# Patient Record
Sex: Female | Born: 1985
Health system: Southern US, Community
[De-identification: ages and names within clinical notes are randomized; demographics above are authoritative.]

## PROBLEM LIST (undated history)

## (undated) DIAGNOSIS — M419 Scoliosis, unspecified: Secondary | ICD-10-CM

## (undated) DIAGNOSIS — Z8669 Personal history of other diseases of the nervous system and sense organs: Secondary | ICD-10-CM

## (undated) DIAGNOSIS — J45909 Unspecified asthma, uncomplicated: Secondary | ICD-10-CM

## (undated) DIAGNOSIS — E559 Vitamin D deficiency, unspecified: Secondary | ICD-10-CM

## (undated) DIAGNOSIS — T7840XA Allergy, unspecified, initial encounter: Secondary | ICD-10-CM

## (undated) HISTORY — PX: WISDOM TOOTH EXTRACTION: SHX21

## (undated) HISTORY — DX: Scoliosis, unspecified: M41.9

## (undated) HISTORY — DX: Vitamin D deficiency, unspecified: E55.9

## (undated) HISTORY — DX: Personal history of other diseases of the nervous system and sense organs: Z86.69

## (undated) HISTORY — DX: Allergy, unspecified, initial encounter: T78.40XA

## (undated) HISTORY — DX: Unspecified asthma, uncomplicated: J45.909

## (undated) HISTORY — PX: TONSILLECTOMY: SUR1361

---
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
## 2006-08-22 ENCOUNTER — Ambulatory Visit: Payer: Self-pay

## 2010-09-04 ENCOUNTER — Ambulatory Visit: Payer: Self-pay | Admitting: Family Medicine

## 2011-03-30 ENCOUNTER — Ambulatory Visit: Payer: Self-pay | Admitting: General Practice

## 2012-02-19 ENCOUNTER — Ambulatory Visit: Payer: Self-pay | Admitting: Medical

## 2014-05-08 ENCOUNTER — Other Ambulatory Visit: Payer: Self-pay | Admitting: General Practice

## 2014-05-08 DIAGNOSIS — J3501 Chronic tonsillitis: Secondary | ICD-10-CM

## 2014-05-11 ENCOUNTER — Ambulatory Visit
Admission: RE | Admit: 2014-05-11 | Discharge: 2014-05-11 | Disposition: A | Discharge status: Home / Self Care | Payer: BC Managed Care – PPO | Source: Ambulatory Visit | Attending: General Practice | Admitting: General Practice

## 2014-05-11 DIAGNOSIS — J3501 Chronic tonsillitis: Secondary | ICD-10-CM

## 2014-05-11 MED ORDER — IOHEXOL 300 MG/ML  SOLN
75.0000 mL | Freq: Once | INTRAMUSCULAR | Status: AC | PRN
  Administered 2014-05-11: 75 mL via INTRAVENOUS

## 2014-05-28 ENCOUNTER — Ambulatory Visit: Payer: Self-pay | Admitting: Otolaryngology

## 2014-08-27 LAB — HM PAP SMEAR: HM Pap smear: NEGATIVE

## 2014-08-27 LAB — TSH: TSH: 0.88 u[IU]/mL (ref <=5.90)

## 2014-09-06 LAB — LIPID PANEL
Cholesterol: 218 mg/dL — AB (ref 0–200)
HDL: 57 mg/dL (ref 35–70)
LDL Cholesterol: 138 mg/dL
Triglycerides: 113 mg/dL (ref 40–160)

## 2015-03-28 IMAGING — CT CT NECK W/ CM
4 of 5 series · 16 of 33 positions shown, 19 images · IV contrast (omnipaque)
Comparison: Face CT 03/30/2011.

CLINICAL DATA: 28-year-old female with tonsillitis, right greater
than left pain status post multiple rounds of antibiotics. Query
abscess. Initial encounter.

EXAM:
CT NECK WITH CONTRAST
TECHNIQUE: Multidetector CT imaging of the neck was performed using the
standard protocol following the bolus administration of intravenous
contrast.
CONTRAST:  75mL OMNIPAQUE IOHEXOL 300 MG/ML  SOLN

[Series 2: axial neck · axial · 0.39mm/px · z∈[+82,+180]mm · 3 of 99 slices shown]
[im 20/99  bone]
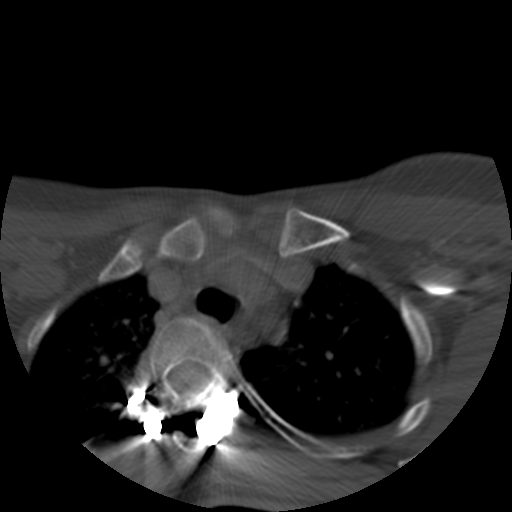
[im 40/99  bone]
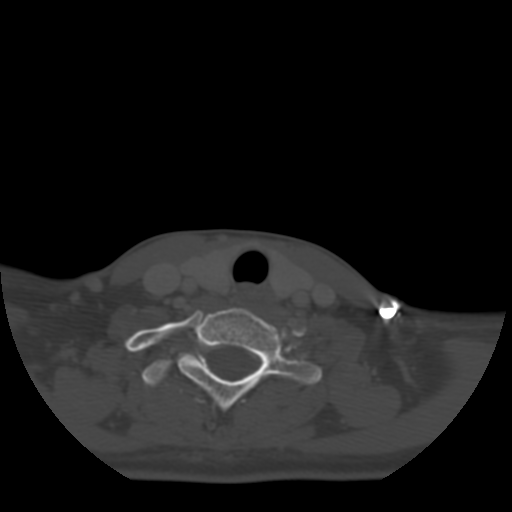
[im 59/99  bone]
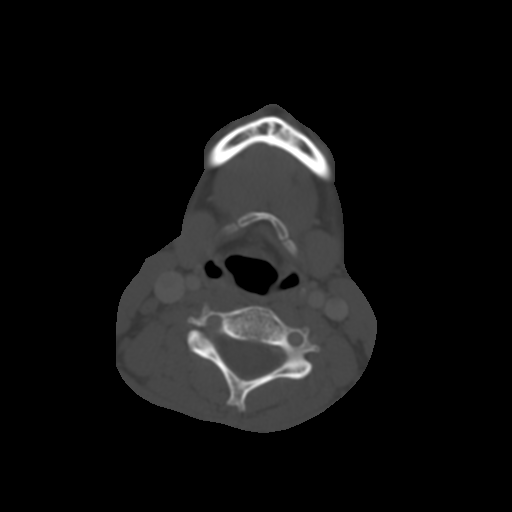

[Series 400: cor · coronal · 0.49mm/px · 3 of 88 slices shown]
[im 18/88  bone]
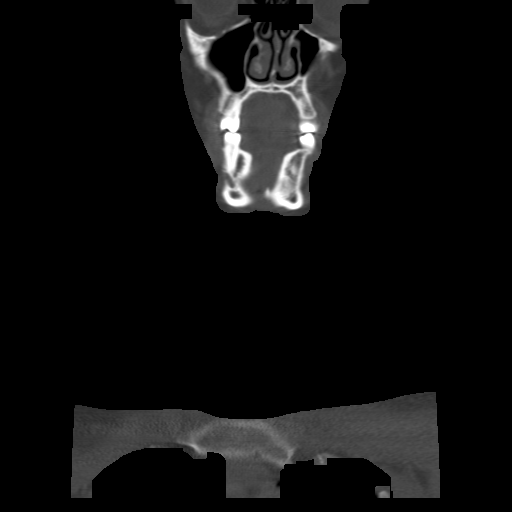
[im 35/88  bone]
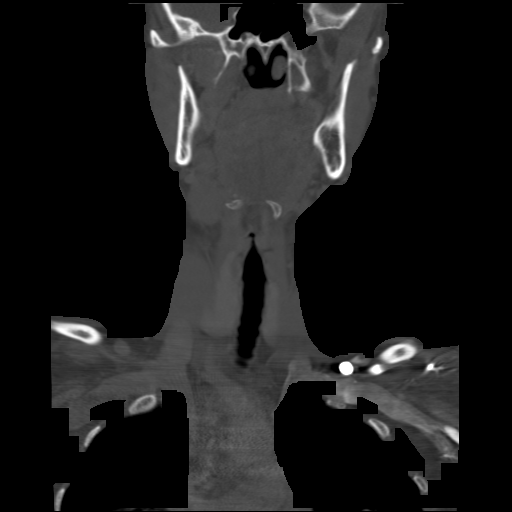
[im 53/88  bone]
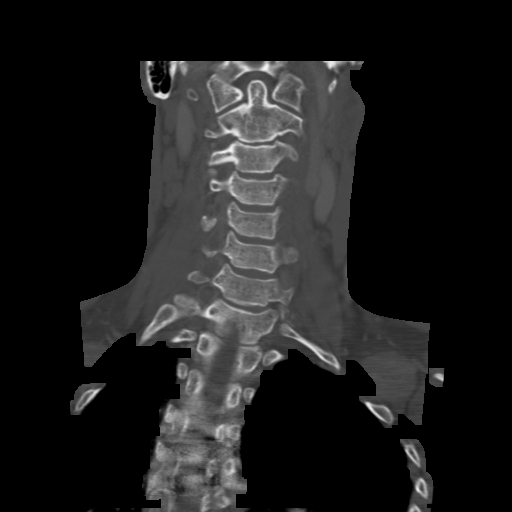

[Series 401: sag · sagittal · 0.49mm/px · 5 of 102 slices shown, 6 images]
[im 34/102  bone]
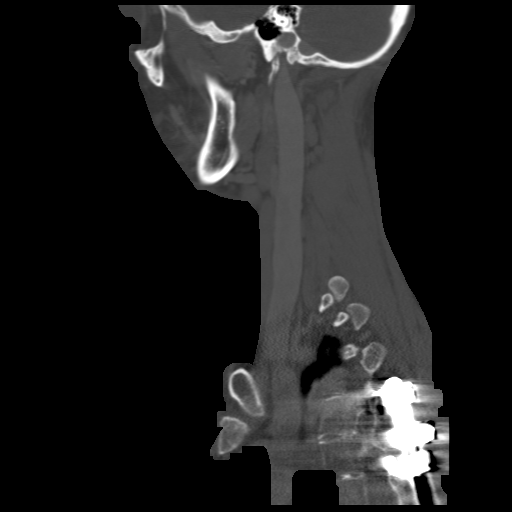
[im 43/102  bone]
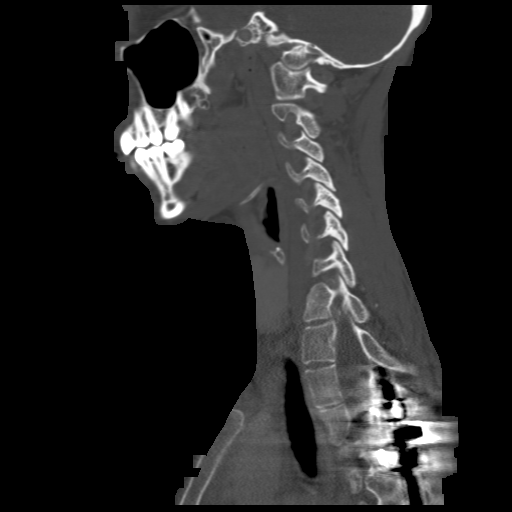
[im 51/102  soft-tissue]
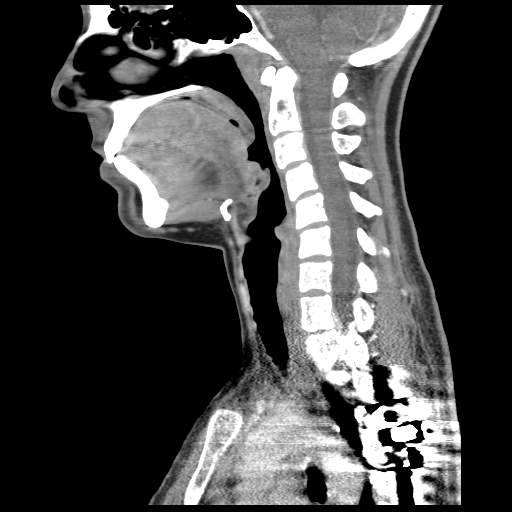
[im 51/102  bone]
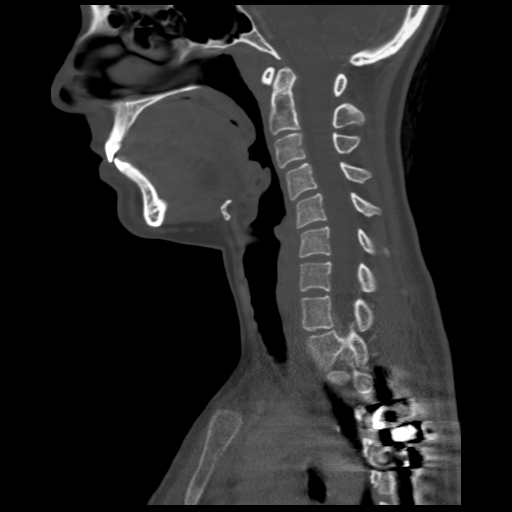
[im 59/102  bone]
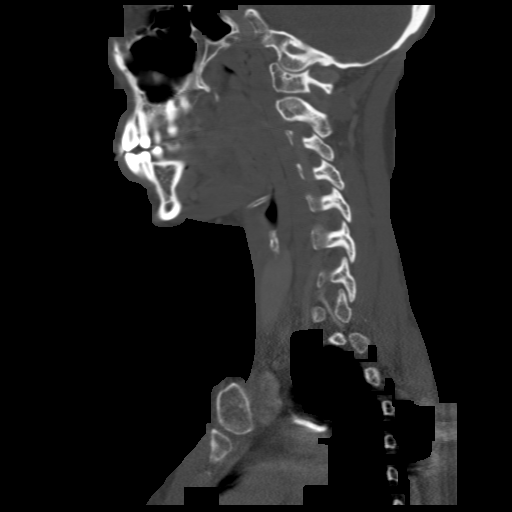
[im 68/102  bone]
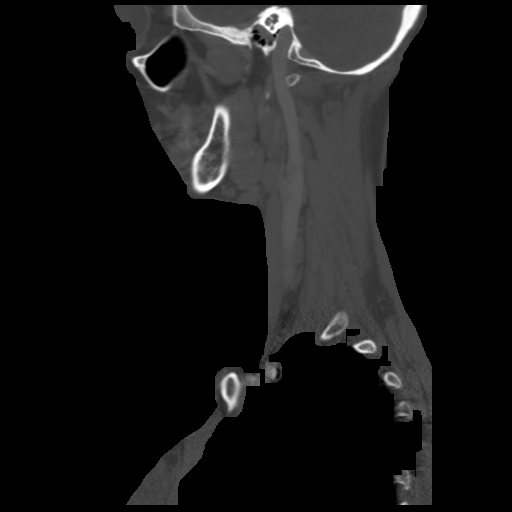

[Series 402: axial · axial · 0.39mm/px · z∈[+54,+221]mm · 5 of 133 slices shown, 7 images]
[im 23/133  soft-tissue]
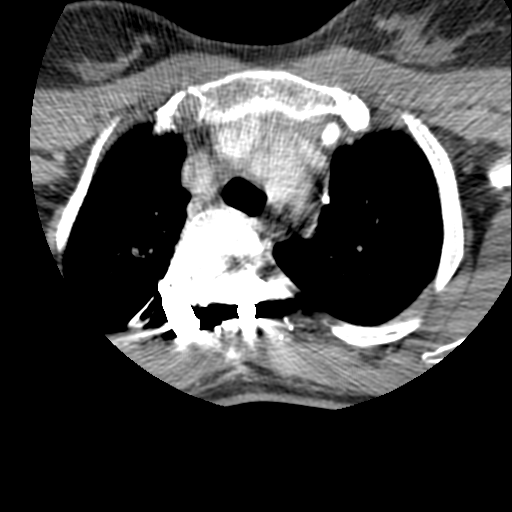
[im 23/133  bone]
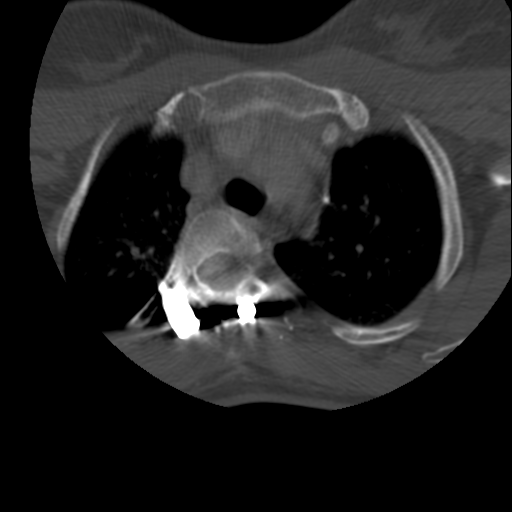
[im 45/133  bone]
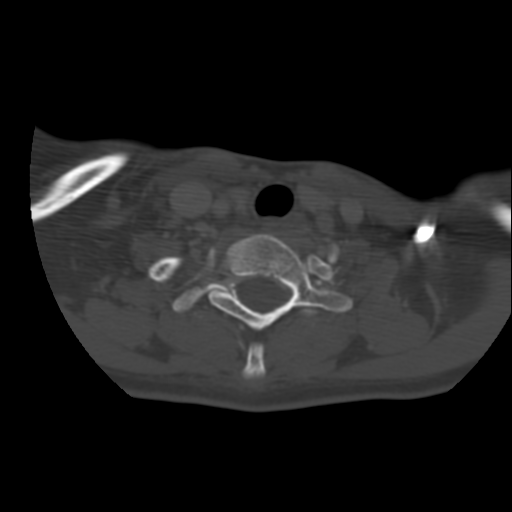
[im 67/133  bone]
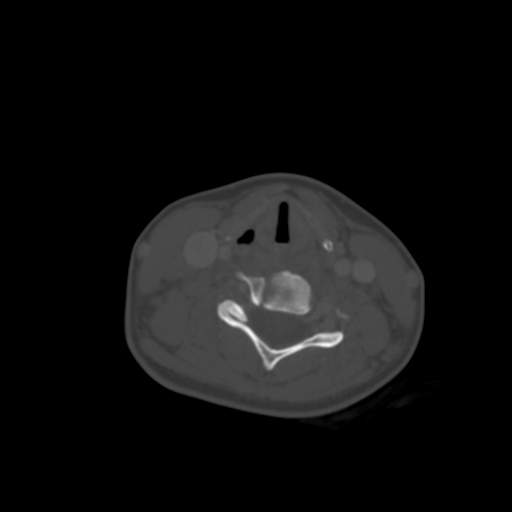
[im 89/133  bone]
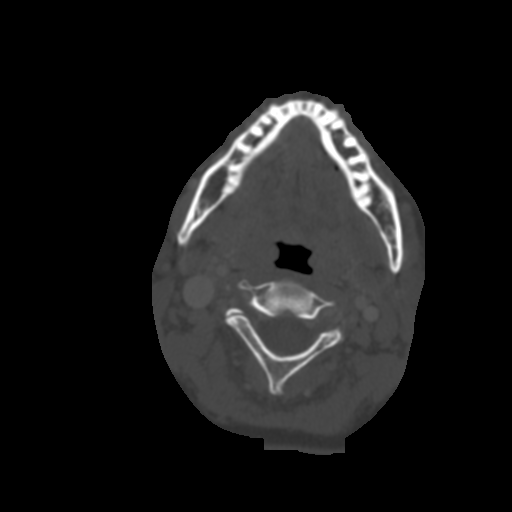
[im 111/133  soft-tissue]
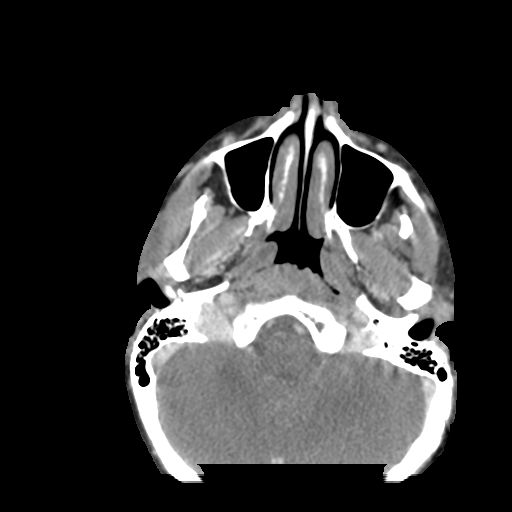
[im 111/133  bone]
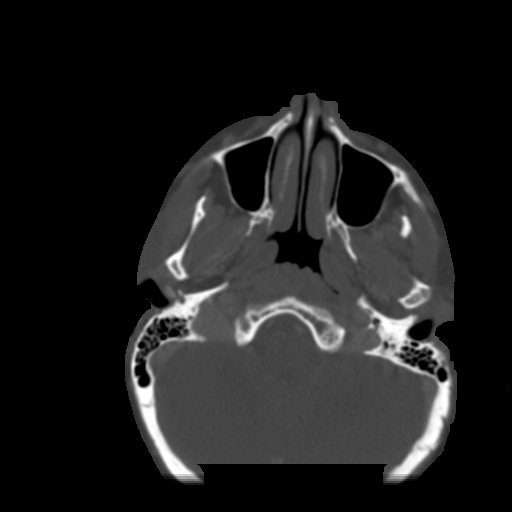

[16 of 33 positions shown; findings below may reference images not displayed]

FINDINGS: Scoliosis with posterior spinal rods partially visible. Negative
lung apices. Negative superior mediastinum (small volume residual
thymus).

Negative thyroid, larynx, submandibular glands, parotid glands,
visible orbits soft tissues, visualized brain parenchyma, and major
vascular structures in the neck and at skullbase.

Mild adenoid and tonsillar pillar enlargement appears fairly
symmetric. The adenoids appear similar to the prior study. Both
tonsillar pillars appear mildly enlarged compared to prior, but no
tonsillar hyper enhancement. No peritonsillar abscess. Negative
parapharyngeal spaces. Negative retropharyngeal space. Negative
sublingual space.

Maximal level II lymph nodes right greater than left (individually
up to 10 mm, series 2, image 31). No hyperenhancing nodes. No cystic
or necrotic nodes. No surrounding fat stranding identified. Right
level 3 nodes are mildly asymmetric, 5 mm short axis on the left).
Up to 8 mm short axis (versus other cervical lymph nodal stations
are within normal limits.

Stable and negative visualized paranasal sinuses and mastoids. No
acute osseous abnormality identified.
IMPRESSION: 1. No neck abscess. Fairly unremarkable for age appearance of the
adenoids and tonsils.
2. Mild reactive appearing right greater than left level 2 and right
level 3 lymph nodes. No nodes enlarged by CT criteria or abnormally
enhancing.

## 2015-08-12 ENCOUNTER — Encounter: Payer: Self-pay | Admitting: *Deleted

## 2015-09-02 ENCOUNTER — Encounter: Payer: Self-pay | Admitting: Obstetrics and Gynecology

## 2015-09-02 ENCOUNTER — Ambulatory Visit (INDEPENDENT_AMBULATORY_CARE_PROVIDER_SITE_OTHER): Payer: BLUE CROSS/BLUE SHIELD | Admitting: Obstetrics and Gynecology

## 2015-09-02 VITALS — BP 98/64 | HR 82 | Ht 63.0 in | Wt 134.3 lb

## 2015-09-02 DIAGNOSIS — Z01419 Encounter for gynecological examination (general) (routine) without abnormal findings: Secondary | ICD-10-CM | POA: Diagnosis not present

## 2015-09-02 NOTE — Progress Notes (Signed)
  Subjective:     Madison [name] is a 29 y.o. female and is here for a comprehensive physical exam. The patient reports no problems.  Social History   Social History  . Marital Status: Single    Spouse Name: N/A  . Number of Children: N/A  . Years of Education: N/A   Occupational History  . Not on file.   Social History Main Topics  . Smoking status: Never Smoker   . Smokeless tobacco: Never Used  . Alcohol Use: No  . Drug Use: No  . Sexual Activity: Yes    Birth Control/ Protection: Pill   Other Topics Concern  . Not on file   Social History Narrative   Health Maintenance  Topic Date Due  . HIV Screening  02/13/2000  . TETANUS/TDAP  02/13/2004  . INFLUENZA VACCINE  05/24/2015  . PAP SMEAR  08/27/2017    The following portions of the patient's history were reviewed and updated as appropriate: allergies, current medications, past family history, past medical history, past social history, past surgical history and problem list.  Review of Systems A comprehensive review of systems was negative.   Objective:    General appearance: alert, cooperative and appears stated age Neck: no adenopathy, no carotid bruit, no JVD, supple, symmetrical, trachea midline and thyroid not enlarged, symmetric, no tenderness/mass/nodules Lungs: clear to auscultation bilaterally Breasts: normal appearance, no masses or tenderness Heart: regular rate and rhythm, S1, S2 normal, no murmur, click, rub or gallop Abdomen: soft, non-tender; bowel sounds normal; no masses,  no organomegaly Pelvic: cervix normal in appearance, external genitalia normal, no adnexal masses or tenderness, no cervical motion tenderness, rectovaginal septum normal, uterus normal size, shape, and consistency and vagina normal without discharge    Assessment:    Healthy female exam. Contraception counseling- desires IUD      Plan:  RTC 1 year for AE and within next 4-6 weeks for Skyla insertion   See After Visit  Summary for Counseling Recommendations

## 2015-09-02 NOTE — Patient Instructions (Signed)
Thank you for enrolling in [facility]. Please follow the instructions below to securely access your online medical record. MyChart allows you to send messages to your doctor, view your test results, renew your prescriptions, schedule appointments, and more.  How Do I Sign Up? 1. In your Internet browser, go to [email or web address]. 2. Click on the New  User? link in the Sign In box.  3. Enter your MyChart Access Code exactly as it appears below. You will not need to use this code after you have completed the sign-up process. If you do not sign up before the expiration date, you must request a new code. MyChart Access Code: [email or web address] Expires: 10/30/2015  9:33 AM  4. Enter the last four digits of your Social Security Number (xxxx) and Date of Birth (mm/dd/yyyy) as indicated and click Next. You will be taken to the next sign-up page. 5. Create a MyChart ID. This will be your MyChart login ID and cannot be changed, so think of one that is secure and easy to remember. 6. Create a MyChart password. You can change your password at any time. 7. Enter your Password Reset Question and Answer and click Next. This can be used at a later time if you forget your password.  8. Select your communication preference, and if applicable enter your e-mail address. You will receive e-mail notification when new information is available in MyChart by choosing to receive e-mail notifications and filling in your e-mail. 9. Click Sign In. You can now view your medical record.   Additional Information If you have questions, you can email [email or web address] or call [phone] to talk to our [facility] staff. Remember, MyChart is NOT to be used for urgent needs. For medical emergencies, dial 911. Levonorgestrel intrauterine device (IUD) What is this medicine? LEVONORGESTREL IUD (LEE voe nor jes trel) is a contraceptive (birth control) device. The device is placed inside the uterus by a  healthcare professional. It is used to prevent pregnancy and can also be used to treat heavy bleeding that occurs during your period. Depending on the device, it can be used for 3 to 5 years. This medicine may be used for other purposes; ask your health care provider or pharmacist if you have questions. What should I tell my health care provider before I take this medicine? They need to know if you have any of these conditions: -abnormal Pap smear -cancer of the breast, uterus, or cervix -diabetes -endometritis -genital or pelvic infection now or in the past -have more than one sexual partner or your partner has more than one partner -heart disease -history of an ectopic or tubal pregnancy -immune system problems -IUD in place -liver disease or tumor -problems with blood clots or take blood-thinners -use intravenous drugs -uterus of unusual shape -vaginal bleeding that has not been explained -an unusual or allergic reaction to levonorgestrel, other hormones, silicone, or polyethylene, medicines, foods, dyes, or preservatives -pregnant or trying to get pregnant -breast-feeding How should I use this medicine? This device is placed inside the uterus by a health care professional. Talk to your pediatrician regarding the use of this medicine in children. Special care may be needed. Overdosage: If you think you have taken too much of this medicine contact a poison control center or emergency room at once. NOTE: This medicine is only for you. Do not share this medicine with others. What if I miss a dose? This does not apply. What may interact with this medicine? Do not take  this medicine with any of the following medications: -amprenavir -bosentan -fosamprenavir This medicine may also interact with the following medications: -aprepitant -barbiturate medicines for inducing sleep or treating seizures -bexarotene -griseofulvin -medicines to treat seizures like carbamazepine, ethotoin,  felbamate, oxcarbazepine, phenytoin, topiramate -modafinil -pioglitazone -rifabutin -rifampin -rifapentine -some medicines to treat HIV infection like atazanavir, indinavir, lopinavir, nelfinavir, tipranavir, ritonavir -St. John's wort -warfarin This list may not describe all possible interactions. Give your health care provider a list of all the medicines, herbs, non-prescription drugs, or dietary supplements you use. Also tell them if you smoke, drink alcohol, or use illegal drugs. Some items may interact with your medicine. What should I watch for while using this medicine? Visit your doctor or health care professional for regular check ups. See your doctor if you or your partner has sexual contact with others, becomes HIV positive, or gets a sexual transmitted disease. This product does not protect you against HIV infection (AIDS) or other sexually transmitted diseases. You can check the placement of the IUD yourself by reaching up to the top of your vagina with clean fingers to feel the threads. Do not pull on the threads. It is a good habit to check placement after each menstrual period. Call your doctor right away if you feel more of the IUD than just the threads or if you cannot feel the threads at all. The IUD may come out by itself. You may become pregnant if the device comes out. If you notice that the IUD has come out use a backup birth control method like condoms and call your health care provider. Using tampons will not change the position of the IUD and are okay to use during your period. What side effects may I notice from receiving this medicine? Side effects that you should report to your doctor or health care professional as soon as possible: -allergic reactions like skin rash, itching or hives, swelling of the face, lips, or tongue -fever, flu-like symptoms -genital sores -high blood pressure -no menstrual period for 6 weeks during use -pain, swelling, warmth in the  leg -pelvic pain or tenderness -severe or sudden headache -signs of pregnancy -stomach cramping -sudden shortness of breath -trouble with balance, talking, or walking -unusual vaginal bleeding, discharge -yellowing of the eyes or skin Side effects that usually do not require medical attention (report to your doctor or health care professional if they continue or are bothersome): -acne -breast pain -change in sex drive or performance -changes in weight -cramping, dizziness, or faintness while the device is being inserted -headache -irregular menstrual bleeding within first 3 to 6 months of use -nausea This list may not describe all possible side effects. Call your doctor for medical advice about side effects. You may report side effects to FDA at 1-800-FDA-1088. Where should I keep my medicine? This does not apply. NOTE: This sheet is a summary. It may not cover all possible information. If you have questions about this medicine, talk to your doctor, pharmacist, or health care provider.    2016, Elsevier/Gold Standard. (2011-11-09 13:54:04)

## 2015-10-01 ENCOUNTER — Encounter: Payer: Self-pay | Admitting: Obstetrics and Gynecology

## 2015-10-01 ENCOUNTER — Ambulatory Visit: Payer: BLUE CROSS/BLUE SHIELD | Admitting: Obstetrics and Gynecology

## 2015-10-01 ENCOUNTER — Ambulatory Visit (INDEPENDENT_AMBULATORY_CARE_PROVIDER_SITE_OTHER): Payer: BLUE CROSS/BLUE SHIELD | Admitting: Obstetrics and Gynecology

## 2015-10-01 VITALS — BP 112/62 | HR 80 | Ht 63.0 in | Wt 132.8 lb

## 2015-10-01 DIAGNOSIS — Z30430 Encounter for insertion of intrauterine contraceptive device: Secondary | ICD-10-CM

## 2015-10-01 MED ORDER — LEVONORGESTREL 13.5 MG IU IUD: 1.0000 [IU] | INTRAUTERINE_SYSTEM | Freq: Once | INTRAUTERINE | Status: DC

## 2015-10-01 NOTE — Progress Notes (Signed)
  Madison [name] is a 29 y.o. year old G72P0000 Caucasian female who presents for placement of a Skyla IUD.  No LMP recorded. Patient is not currently having periods (Reason: Oral contraceptives). Last sexual intercourse was 2 weeks ago, and pregnancy test today was negative  The risks and benefits of the method and placement have been thouroughly reviewed with the patient and all questions were answered.  Specifically the patient is aware of failure rate of 10/998, expulsion of the IUD and of possible perforation.  The patient is aware of irregular bleeding due to the method and understands the incidence of irregular bleeding diminishes with time.  Signed copy of informed consent in chart.   Time out was performed.  A Pederson speculum was placed in the vagina.  The cervix was visualized, prepped using Betadine, and grasped with a single tooth tenaculum. The uterus was found to be neutral and it sounded to 8 cm.  Skyla IUD placed per manufacturer's recommendations.   The strings were trimmed to 3 cm.  The patient was given post procedure instructions, including signs and symptoms of infection and to check for the strings after each menses or each month, and refraining from intercourse or anything in the vagina for 3 days.  She was given a [facility] care card with date Skyla placed, and date Skyla to be removed.   Rockney Ghee, CNM

## 2016-09-05 ENCOUNTER — Encounter: Payer: Self-pay | Admitting: Obstetrics and Gynecology

## 2016-09-05 ENCOUNTER — Ambulatory Visit (INDEPENDENT_AMBULATORY_CARE_PROVIDER_SITE_OTHER): Payer: BLUE CROSS/BLUE SHIELD | Admitting: Obstetrics and Gynecology

## 2016-09-05 ENCOUNTER — Other Ambulatory Visit: Payer: Self-pay | Admitting: Obstetrics and Gynecology

## 2016-09-05 VITALS — BP 101/67 | HR 72 | Ht 63.0 in | Wt 127.9 lb

## 2016-09-05 DIAGNOSIS — Z01419 Encounter for gynecological examination (general) (routine) without abnormal findings: Secondary | ICD-10-CM

## 2016-09-05 NOTE — Patient Instructions (Signed)
 Preventive Care 18-39 Years, Female Preventive care refers to lifestyle choices and visits with your health care provider that can promote health and wellness. What does preventive care include?  A yearly physical exam. This is also called an annual well check.  Dental exams once or twice a year.  Routine eye exams. Ask your health care provider how often you should have your eyes checked.  Personal lifestyle choices, including:  Daily care of your teeth and gums.  Regular physical activity.  Eating a healthy diet.  Avoiding tobacco and drug use.  Limiting alcohol use.  Practicing safe sex.  Taking vitamin and mineral supplements as recommended by your health care provider. What happens during an annual well check? The services and screenings done by your health care provider during your annual well check will depend on your age, overall health, lifestyle risk factors, and family history of disease. Counseling  Your health care provider may ask you questions about your:  Alcohol use.  Tobacco use.  Drug use.  Emotional well-being.  Home and relationship well-being.  Sexual activity.  Eating habits.  Work and work environment.  Method of birth control.  Menstrual cycle.  Pregnancy history. Screening  You may have the following tests or measurements:  Height, weight, and BMI.  Diabetes screening. This is done by checking your blood sugar (glucose) after you have not eaten for a while (fasting).  Blood pressure.  Lipid and cholesterol levels. These may be checked every 5 years starting at age 20.  Skin check.  Hepatitis C blood test.  Hepatitis B blood test.  Sexually transmitted disease (STD) testing.  BRCA-related cancer screening. This may be done if you have a family history of breast, ovarian, tubal, or peritoneal cancers.  Pelvic exam and Pap test. This may be done every 3 years starting at age 21. Starting at age 30, this may be done  every 5 years if you have a Pap test in combination with an HPV test. Discuss your test results, treatment options, and if necessary, the need for more tests with your health care provider. Vaccines  Your health care provider may recommend certain vaccines, such as:  Influenza vaccine. This is recommended every year.  Tetanus, diphtheria, and acellular pertussis (Tdap, Td) vaccine. You may need a Td booster every 10 years.  Varicella vaccine. You may need this if you have not been vaccinated.  HPV vaccine. If you are 26 or younger, you may need three doses over 6 months.  Measles, mumps, and rubella (MMR) vaccine. You may need at least one dose of MMR. You may also need a second dose.  Pneumococcal 13-valent conjugate (PCV13) vaccine. You may need this if you have certain conditions and were not previously vaccinated.  Pneumococcal polysaccharide (PPSV23) vaccine. You may need one or two doses if you smoke cigarettes or if you have certain conditions.  Meningococcal vaccine. One dose is recommended if you are age 19-21 years and a first-year college student living in a residence hall, or if you have one of several medical conditions. You may also need additional booster doses.  Hepatitis A vaccine. You may need this if you have certain conditions or if you travel or work in places where you may be exposed to hepatitis A.  Hepatitis B vaccine. You may need this if you have certain conditions or if you travel or work in places where you may be exposed to hepatitis B.  Haemophilus influenzae type b (Hib) vaccine. You may need   this if you have certain risk factors. Talk to your health care provider about which screenings and vaccines you need and how often you need them. This information is not intended to replace advice given to you by your health care provider. Make sure you discuss any questions you have with your health care provider. Document Released: 12/05/2001 Document Revised:  06/28/2016 Document Reviewed: 08/10/2015 Elsevier Interactive Patient Education  2017 Elsevier Inc.  

## 2016-09-05 NOTE — Progress Notes (Signed)
Subjective:   Madison [name] is a 30 y.o. [place] female here for a routine well-woman exam.  Patient's last menstrual period was 08/14/2016.    Current complaints: prolonged but light bleeding with menses since Skyla placed PCP: me       doesn't desire labs, Tdap & flu vaccine already given this year  Social History: Sexual: heterosexual Marital Status: engaged Living situation: alone Occupation: works at [employer]: no tobacco use Illicit drugs: no history of illicit drug use  The following portions of the patient's history were reviewed and updated as appropriate: allergies, current medications, past family history, past medical history, past social history, past surgical history and problem list.  Past Medical History Past Medical History:  Diagnosis Date  . Vitamin D deficiency     Past Surgical History Past Surgical History:  Procedure Laterality Date  . APPENDECTOMY    . TONSILLECTOMY      Gynecologic History G0P0000  Patient's last menstrual period was 08/14/2016. Contraception: IUD Last Pap: 2015. Results were: normal *  Obstetric History OB History  Gravida Para Term Preterm AB Living  0 0 0 0 0 0  SAB TAB Ectopic Multiple Live Births  0 0 0 0          Current Medications Current Outpatient Prescriptions on File Prior to Visit  Medication Sig Dispense Refill  . Levonorgestrel (SKYLA) 13.5 MG IUD 1 Units by Intrauterine route once. 1 Intra Uterine Device 0  . norethindrone-ethinyl estradiol (JUNEL FE,GILDESS FE,LOESTRIN FE) 1-20 MG-MCG tablet Take 1 tablet by mouth daily.     No current facility-administered medications on file prior to visit.     Review of Systems Patient denies any headaches, blurred vision, shortness of breath, chest pain, abdominal pain, problems with bowel movements, urination, or intercourse.  Objective:  BP 101/67   Pulse 72   Ht 5' 3"  (1.6 m)   Wt 127 lb 14.4 oz (58 kg)   LMP 08/14/2016   BMI  22.66 kg/m  Physical Exam  General:  Well developed, well nourished, no acute distress. She is alert and oriented x3. Skin:  Warm and dry Neck:  Midline trachea, no thyromegaly or nodules Cardiovascular: Regular rate and rhythm, no murmur heard Lungs:  Effort normal, all lung fields clear to auscultation bilaterally Breasts:  No dominant palpable mass, retraction, or nipple discharge Abdomen:  Soft, non tender, no hepatosplenomegaly or masses Pelvic:  External genitalia is normal in appearance.  The vagina is normal in appearance. The cervix is bulbous, no CMT.  Thin prep pap is done with HR HPV cotesting. Uterus is felt to be normal size, shape, and contour.  No adnexal masses or tenderness noted.IUD string noted Extremities:  No swelling or varicosities noted Psych:  She has a normal mood and affect  Assessment:   Healthy well-woman exam IUD check  Plan:  No labs indicated Discussed using OCPs to regulate menses F/U 1 year for AE, or sooner if needed    Rockney Ghee, CNM

## 2016-09-06 LAB — CYTOLOGY - PAP

## 2017-02-15 ENCOUNTER — Ambulatory Visit: Payer: Self-pay | Admitting: Medical

## 2017-02-15 ENCOUNTER — Encounter: Payer: Self-pay | Admitting: Medical

## 2017-02-15 VITALS — BP 102/64 | HR 74 | Temp 98.3°F | Resp 16 | Ht 63.0 in | Wt 127.0 lb

## 2017-02-15 DIAGNOSIS — J302 Other seasonal allergic rhinitis: Secondary | ICD-10-CM | POA: Insufficient documentation

## 2017-02-15 DIAGNOSIS — J301 Allergic rhinitis due to pollen: Secondary | ICD-10-CM

## 2017-02-15 DIAGNOSIS — R0982 Postnasal drip: Secondary | ICD-10-CM

## 2017-02-15 DIAGNOSIS — H6983 Other specified disorders of Eustachian tube, bilateral: Secondary | ICD-10-CM

## 2017-02-15 NOTE — Progress Notes (Signed)
   Subjective:    Patient ID: Madison [name], female    DOB: [birth year], 31 y.o.   MRN: [id number]  HPI 31 yo female with ear pain bilaterally L>R since Monday,  Facial pressure behind eyes. No fever or chills. No colored discharge from nose or cough.Came to be checked seeing family member who is on Chemo and wants to make sure she is well.    Review of Systems  Constitutional: Positive for chills. Negative for fever.  HENT: Positive for ear pain, postnasal drip, rhinorrhea, sinus pressure, sneezing, sore throat and trouble swallowing. Negative for congestion, ear discharge, sinus pain and tinnitus.   Eyes: Negative for discharge and itching.  Respiratory: Positive for cough and shortness of breath. Negative for wheezing.   Cardiovascular: Negative for chest pain.  Gastrointestinal: Negative for diarrhea, nausea and vomiting.  Endocrine: Negative for cold intolerance and heat intolerance.  Genitourinary: Negative for dysuria.  Musculoskeletal: Positive for neck pain. Negative for back pain.  Allergic/Immunologic: Positive for environmental allergies. Negative for food allergies.  Neurological: Negative for dizziness, syncope and light-headedness.  Hematological: Positive for adenopathy.  Psychiatric/Behavioral: Negative for confusion and hallucinations.  neck pain from sitting in meetings looking at the lap top. Usually stretches neck out to relieve pain.     Objective:   Physical Exam  Constitutional: She is oriented to person, place, and time. She appears well-developed and well-nourished.  HENT:  Head: Normocephalic and atraumatic.  Right Ear: External ear normal.  Left Ear: External ear normal.  Eyes: Conjunctivae and EOM are normal. Pupils are equal, round, and reactive to light.  Neck: Normal range of motion. Neck supple.  Cardiovascular: Normal rate, regular rhythm and normal heart sounds.  Exam reveals no gallop and no friction rub.   No murmur heard. Pulmonary/Chest:  Effort normal and breath sounds normal.  Musculoskeletal: Normal range of motion.  Neurological: She is alert and oriented to person, place, and time.  Skin: Skin is warm and dry.  Psychiatric: She has a normal mood and affect. Her behavior is normal.  Nursing note and vitals reviewed.  Shiners under eyes.       Assessment & Plan:  Eustachian Tube Dysfunction recommended otc decongestant (pseudoephedrine she likes the best). Post nasal drip stay on Allegra take as directed ( does not like steroid nasal sprays). Use saline nasal spray  3-4 x /day or nettie pot . Return to the clinic if symptoms doesn't improve or if they worsen. Verbalized understanding. No questions at discharge.

## 2017-02-15 NOTE — Progress Notes (Signed)
Patient complains of bilateral ear pain for 1 week Left worse than right.  Also states she has a sore throat, feels like a lump in the back of her throat, also states that neck is tender to touch.  OTC - allegra.

## 2017-03-01 ENCOUNTER — Ambulatory Visit: Payer: Self-pay | Admitting: Medical

## 2017-04-10 ENCOUNTER — Encounter: Payer: Self-pay | Admitting: Obstetrics and Gynecology

## 2017-04-16 ENCOUNTER — Encounter: Payer: Self-pay | Admitting: Medical

## 2017-04-16 ENCOUNTER — Ambulatory Visit: Payer: Self-pay | Admitting: Medical

## 2017-04-16 VITALS — BP 90/64 | HR 77 | Temp 98.8°F | Resp 16

## 2017-04-16 DIAGNOSIS — R059 Cough, unspecified: Secondary | ICD-10-CM

## 2017-04-16 DIAGNOSIS — R05 Cough: Secondary | ICD-10-CM

## 2017-04-16 DIAGNOSIS — J0100 Acute maxillary sinusitis, unspecified: Secondary | ICD-10-CM

## 2017-04-16 DIAGNOSIS — J029 Acute pharyngitis, unspecified: Secondary | ICD-10-CM

## 2017-04-16 LAB — POCT RAPID STREP A (OFFICE): RAPID STREP A SCREEN: NEGATIVE

## 2017-04-16 MED ORDER — AMOXICILLIN-POT CLAVULANATE 875-125 MG PO TABS: 1.0000 | ORAL_TABLET | Freq: Two times a day (BID) | ORAL | 0 refills | Status: DC

## 2017-04-16 NOTE — Progress Notes (Addendum)
04/20/17 8:50  Patient with cough would like some cough medication.  Will e-prescribed benzonatate perles  32m on by mouth three times a day as needed for cough  #20 no refills.   Strep test negative  Subjective:    Patient ID: Madison [name] female    DOB: 405/24/1987 31y.o.   MRN: [id number] HPI Started yesterday with symptoms of facial pain in the maxillary region, sore throat , couging up yellow and brown phelgm and green discharge from the nares.   Review of Systems  Constitutional: Negative for fever.  HENT: Positive for congestion, postnasal drip, rhinorrhea, sinus pain, sinus pressure and sore throat. Negative for ear pain.   Eyes: Negative.   Respiratory: Positive for cough and chest tightness. Negative for shortness of breath and wheezing.   Cardiovascular: Negative for chest pain.  Gastrointestinal: Negative for abdominal pain.  Genitourinary: Negative for dysuria.  Musculoskeletal: Negative for back pain.  Allergic/Immunologic: Positive for environmental allergies.       Objective:   Physical Exam  Constitutional: She is oriented to person, place, and time. Vital signs are normal. She appears well-developed and well-nourished.  HENT:  Head: Normocephalic and atraumatic.  Right Ear: External ear normal. A middle ear effusion is present.  Left Ear: External ear normal. A middle ear effusion is present.  Nose: Mucosal edema and rhinorrhea present. Right sinus exhibits maxillary sinus tenderness. Left sinus exhibits maxillary sinus tenderness.  Mouth/Throat: Uvula is midline and mucous membranes are normal. Posterior oropharyngeal erythema present. No oropharyngeal exudate, posterior oropharyngeal edema or tonsillar abscesses.  Eyes: Conjunctivae and EOM are normal. Pupils are equal, round, and reactive to light.  Neck: Normal range of motion. Neck supple.  Cardiovascular: Normal rate and regular rhythm.  Exam reveals no gallop and no friction rub.   No murmur  heard. Pulmonary/Chest: Effort normal and breath sounds normal.  Musculoskeletal: Normal range of motion.  Lymphadenopathy:    She has cervical adenopathy.  Neurological: She is alert and oriented to person, place, and time.  Skin: Skin is warm and dry.  Psychiatric: She has a normal mood and affect. Her behavior is normal.  Nursing note and vitals reviewed.   Mild erythema posterior pharynx and  Post nasal drip noted.      Assessment & Plan:  Sinusitis, pharyngitis E-prescribed Augmentin 875 mg -3232mone tablet by mouth twice daily x  10 days  #20 no refills.  Return to the clinic  3-5 days if not improving. Going on retreat for work  returns Wednesday.

## 2017-04-16 NOTE — Patient Instructions (Signed)

## 2017-04-20 MED ORDER — BENZONATATE 100 MG PO CAPS: 100.0000 mg | ORAL_CAPSULE | Freq: Three times a day (TID) | ORAL | 0 refills | Status: DC

## 2017-04-20 NOTE — Addendum Note (Signed)
Addended by: , Nira Conn R on: 04/20/2017 08:55 AM   Modules accepted: Orders

## 2017-05-24 ENCOUNTER — Encounter: Payer: Self-pay | Admitting: Obstetrics and Gynecology

## 2017-06-29 ENCOUNTER — Other Ambulatory Visit: Payer: Self-pay

## 2017-06-29 DIAGNOSIS — Z0000 Encounter for general adult medical examination without abnormal findings: Secondary | ICD-10-CM

## 2017-06-29 LAB — POCT URINALYSIS DIPSTICK
BILIRUBIN UA: NEGATIVE
Glucose, UA: NEGATIVE
Ketones, UA: NEGATIVE
LEUKOCYTES UA: NEGATIVE
NITRITE UA: NEGATIVE
PH UA: 6 (ref 5.0–8.0)
PROTEIN UA: NEGATIVE
Spec Grav, UA: 1.01 (ref 1.010–1.025)
UROBILINOGEN UA: 0.2 U/dL

## 2017-06-30 LAB — CMP12+LP+TP+TSH+6AC+CBC/D/PLT
ALBUMIN: 4.7 g/dL (ref 3.5–5.5)
ALT: 15 IU/L (ref 0–32)
AST: 20 IU/L (ref 0–40)
Albumin/Globulin Ratio: 2 (ref 1.2–2.2)
Alkaline Phosphatase: 45 IU/L (ref 39–117)
BUN/Creatinine Ratio: 16 (ref 9–23)
BUN: 11 mg/dL (ref 6–20)
Basophils Absolute: 0 10*3/uL (ref 0.0–0.2)
Basos: 0 %
Bilirubin Total: 0.4 mg/dL (ref 0.0–1.2)
CALCIUM: 9.6 mg/dL (ref 8.7–10.2)
CHOLESTEROL TOTAL: 157 mg/dL (ref 100–199)
Chloride: 104 mmol/L (ref 96–106)
Chol/HDL Ratio: 2.7 ratio (ref 0.0–4.4)
Creatinine, Ser: 0.67 mg/dL (ref 0.57–1.00)
EOS (ABSOLUTE): 0.2 10*3/uL (ref 0.0–0.4)
Eos: 2 %
Estimated CHD Risk: <0.5 times avg. (ref 0.0–1.0)
FREE THYROXINE INDEX: 2.5 (ref 1.2–4.9)
GFR calc Af Amer: 135 mL/min/{1.73_m2} (ref >59)
GFR calc non Af Amer: 117 mL/min/{1.73_m2} (ref >59)
GGT: 9 IU/L (ref 0–60)
Globulin, Total: 2.4 g/dL (ref 1.5–4.5)
Glucose: 80 mg/dL (ref 65–99)
HDL: 58 mg/dL (ref >39)
Hematocrit: 40.3 % (ref 34.0–46.6)
Hemoglobin: 12.7 g/dL (ref 11.1–15.9)
IMMATURE GRANS (ABS): 0 10*3/uL (ref 0.0–0.1)
IRON: 135 ug/dL (ref 27–159)
Immature Granulocytes: 0 %
LDH: 144 IU/L (ref 119–226)
LDL Calculated: 86 mg/dL (ref 0–99)
LYMPHS: 22 %
Lymphocytes Absolute: 2 10*3/uL (ref 0.7–3.1)
MCH: 30.2 pg (ref 26.6–33.0)
MCHC: 31.5 g/dL (ref 31.5–35.7)
MCV: 96 fL (ref 79–97)
MONOS ABS: 0.5 10*3/uL (ref 0.1–0.9)
Monocytes: 5 %
NEUTROS PCT: 71 %
Neutrophils Absolute: 6.4 10*3/uL (ref 1.4–7.0)
PHOSPHORUS: 3.3 mg/dL (ref 2.5–4.5)
POTASSIUM: 4.9 mmol/L (ref 3.5–5.2)
Platelets: 275 10*3/uL (ref 150–379)
RBC: 4.21 x10E6/uL (ref 3.77–5.28)
RDW: 13.2 % (ref 12.3–15.4)
Sodium: 142 mmol/L (ref 134–144)
T3 Uptake Ratio: 27 % (ref 24–39)
T4 TOTAL: 9.2 ug/dL (ref 4.5–12.0)
TRIGLYCERIDES: 64 mg/dL (ref 0–149)
TSH: 1.07 u[IU]/mL (ref 0.450–4.500)
Total Protein: 7.1 g/dL (ref 6.0–8.5)
Uric Acid: 4.1 mg/dL (ref 2.5–7.1)
VLDL Cholesterol Cal: 13 mg/dL (ref 5–40)
WBC: 9.1 10*3/uL (ref 3.4–10.8)

## 2017-06-30 LAB — VITAMIN D 25 HYDROXY (VIT D DEFICIENCY, FRACTURES): Vit D, 25-Hydroxy: 26.5 ng/mL — ABNORMAL LOW (ref 30.0–100.0)

## 2017-07-02 ENCOUNTER — Ambulatory Visit: Payer: Self-pay | Admitting: Medical

## 2017-07-04 ENCOUNTER — Encounter: Payer: Self-pay | Admitting: Medical

## 2017-07-04 ENCOUNTER — Ambulatory Visit: Payer: Self-pay | Admitting: Medical

## 2017-07-04 VITALS — BP 118/74 | HR 54 | Temp 97.5°F | Resp 18 | Ht 63.0 in | Wt 127.0 lb

## 2017-07-04 DIAGNOSIS — E559 Vitamin D deficiency, unspecified: Secondary | ICD-10-CM

## 2017-07-04 DIAGNOSIS — Z889 Allergy status to unspecified drugs, medicaments and biological substances status: Secondary | ICD-10-CM

## 2017-07-04 DIAGNOSIS — Z7184 Encounter for health counseling related to travel: Secondary | ICD-10-CM

## 2017-07-04 MED ORDER — AZITHROMYCIN 250 MG PO TABS: ORAL_TABLET | ORAL | 0 refills | Status: DC

## 2017-07-04 MED ORDER — MONTELUKAST SODIUM 10 MG PO TABS: 10.0000 mg | ORAL_TABLET | Freq: Every day | ORAL | 2 refills | Status: DC

## 2017-07-04 NOTE — Progress Notes (Signed)
Subjective:    Patient ID: Madison [name], female    DOB: 04/26/1986, 31 y.o.   MRN: [id number]  HPI  31 yo female here for Travel consultation to   [place] and [place]  4 days each.  Would like a flu vaccine,  And a refill on Singulair. Would also like to take a Z-pak with her on travels in case she gets sick.  Review of Systems  Constitutional: Negative for chills and fever.  HENT: Positive for congestion, ear pain and sinus pressure. Negative for sore throat and tinnitus.   Eyes: Negative for discharge and itching.  Respiratory: Negative for cough and shortness of breath.   Cardiovascular: Negative for chest pain, palpitations and leg swelling.  Gastrointestinal: Negative for abdominal pain.  Endocrine: Negative for polydipsia, polyphagia and polyuria.  Genitourinary: Negative for dysuria.  Musculoskeletal: Negative for back pain and myalgias.  Skin: Positive for rash.  Allergic/Immunologic: Positive for environmental allergies. Negative for food allergies and immunocompromised state.  Neurological: Positive for headaches. Negative for dizziness, syncope and light-headedness.  Hematological: Negative for adenopathy.  Psychiatric/Behavioral: Positive for sleep disturbance. Negative for behavioral problems, confusion, hallucinations, self-injury and suicidal ideas.    Headaches base of the neck wraps around to the front.    Hives on left forearm , thinks it is due to stress. Getting married in 17 days under a lot of stress.. Dreaming about the [facility]. Objective:   Physical Exam  Constitutional: She is oriented to person, place, and time. She appears well-developed and well-nourished.  HENT:  Head: Normocephalic and atraumatic.  Right Ear: External ear normal.  Left Ear: External ear normal.  Mouth/Throat: Oropharynx is clear and moist.  Eyes: Pupils are equal, round, and reactive to light. Conjunctivae and EOM are normal.  Neck: Normal range of motion. Neck supple.   Cardiovascular: Normal rate, regular rhythm and normal heart sounds.   Pulmonary/Chest: Effort normal and breath sounds normal.  Musculoskeletal: Normal range of motion.  Lymphadenopathy:    She has no cervical adenopathy.  Neurological: She is alert and oriented to person, place, and time.  Skin: Rash noted.  Psychiatric: She has a normal mood and affect. Her behavior is normal. Judgment and thought content normal.  Nursing note and vitals reviewed.     Maculopapular fine rash  with erythema on distal forearm left side medially     Assessment & Plan:  Eustachian tube dysfunction bilaterally Travel consult already has had Hep A completed 2017 and Hep B series ( completed in middle school).Tdap in 2017. Flu vaccine given. Contact dermatitis or possibly stress induced rash on the left forearm given hydrocortisone cream!% applied to the area by patient lot [id number] Get documentation of  Hep B vaccine from student health.  Z-Pak in case patient gets sick during traveling. Vitamin D deficiency  5000 IU/ day recheck in  3 months. Reviewed labs with patient from 06/29/2017 needs to set up annual exam appointment. Meds ordered this encounter  Medications  . montelukast (SINGULAIR) 10 MG tablet    Sig: Take 1 tablet (10 mg total) by mouth at bedtime.    Dispense:  30 tablet    Refill:  2  . azithromycin (ZITHROMAX) 250 MG tablet    Sig: Take two tablets by mouth day 1 then one tablet days 2-5 , take  With food.    Dispense:  6 tablet    Refill:  0  return to the clinic as needed. Patient verbalizes understanding and has no questions  at this time.

## 2017-07-04 NOTE — Patient Instructions (Addendum)
Contact the office if you have any concerns.    Influenza Virus Vaccine injection (Fluarix) What is this medicine? INFLUENZA VIRUS VACCINE (in floo EN zuh VAHY ruhs vak SEEN) helps to reduce the risk of getting influenza also known as the flu. This medicine may be used for other purposes; ask your health care provider or pharmacist if you have questions. COMMON BRAND NAME(S): Fluarix, Fluzone What should I tell my health care provider before I take this medicine? They need to know if you have any of these conditions: -bleeding disorder like hemophilia -fever or infection -Guillain-Barre syndrome or other neurological problems -immune system problems -infection with the human immunodeficiency virus (HIV) or AIDS -low blood platelet counts -multiple sclerosis -an unusual or allergic reaction to influenza virus vaccine, eggs, chicken proteins, latex, gentamicin, other medicines, foods, dyes or preservatives -pregnant or trying to get pregnant -breast-feeding How should I use this medicine? This vaccine is for injection into a muscle. It is given by a health care professional. A copy of Vaccine Information Statements will be given before each vaccination. Read this sheet carefully each time. The sheet may change frequently. Talk to your pediatrician regarding the use of this medicine in children. Special care may be needed. Overdosage: If you think you have taken too much of this medicine contact a poison control center or emergency room at once. NOTE: This medicine is only for you. Do not share this medicine with others. What if I miss a dose? This does not apply. What may interact with this medicine? -chemotherapy or radiation therapy -medicines that lower your immune system like etanercept, anakinra, infliximab, and adalimumab -medicines that treat or prevent blood clots like warfarin -phenytoin -steroid medicines like prednisone or cortisone -theophylline -vaccines This list may  not describe all possible interactions. Give your health care provider a list of all the medicines, herbs, non-prescription drugs, or dietary supplements you use. Also tell them if you smoke, drink alcohol, or use illegal drugs. Some items may interact with your medicine. What should I watch for while using this medicine? Report any side effects that do not go away within 3 days to your doctor or health care professional. Call your health care provider if any unusual symptoms occur within 6 weeks of receiving this vaccine. You may still catch the flu, but the illness is not usually as bad. You cannot get the flu from the vaccine. The vaccine will not protect against colds or other illnesses that may cause fever. The vaccine is needed every year. What side effects may I notice from receiving this medicine? Side effects that you should report to your doctor or health care professional as soon as possible: -allergic reactions like skin rash, itching or hives, swelling of the face, lips, or tongue Side effects that usually do not require medical attention (report to your doctor or health care professional if they continue or are bothersome): -fever -headache -muscle aches and pains -pain, tenderness, redness, or swelling at site where injected -weak or tired This list may not describe all possible side effects. Call your doctor for medical advice about side effects. You may report side effects to FDA at 1-800-FDA-1088. Where should I keep my medicine? This vaccine is only given in a clinic, pharmacy, doctor's office, or other health care setting and will not be stored at home. NOTE: This sheet is a summary. It may not cover all possible information. If you have questions about this medicine, talk to your doctor, pharmacist, or health care provider.  2018 Elsevier/Gold Standard (2008-05-06 09:30:40)  Eustachian Tube Dysfunction The eustachian tube connects the middle ear to the back of the nose. It  regulates air pressure in the middle ear by allowing air to move between the ear and nose. It also helps to drain fluid from the middle ear space. When the eustachian tube does not function properly, air pressure, fluid, or both can build up in the middle ear. Eustachian tube dysfunction can affect one or both ears. What are the causes? This condition happens when the eustachian tube becomes blocked or cannot open normally. This may result from:  Ear infections.  Colds and other upper respiratory infections.  Allergies.  Irritation, such as from cigarette smoke or acid from the stomach coming up into the esophagus (gastroesophageal reflux).  Sudden changes in air pressure, such as from descending in an airplane.  Abnormal growths in the nose or throat, such as nasal polyps, tumors, or enlarged tissue at the back of the throat (adenoids).  What increases the risk? This condition may be more likely to develop in people who smoke and people who are overweight. Eustachian tube dysfunction may also be more likely to develop in children, especially children who have:  Certain birth defects of the mouth, such as cleft palate.  Large tonsils and adenoids.  What are the signs or symptoms? Symptoms of this condition may include:  A feeling of fullness in the ear.  Ear pain.  Clicking or popping noises in the ear.  Ringing in the ear.  Hearing loss.  Loss of balance.  Symptoms may get worse when the air pressure around you changes, such as when you travel to an area of high elevation or fly on an airplane. How is this diagnosed? This condition may be diagnosed based on:  Your symptoms.  A physical exam of your ear, nose, and throat.  Tests, such as those that measure: ? The movement of your eardrum (tympanogram). ? Your hearing (audiometry).  How is this treated? Treatment depends on the cause and severity of your condition. If your symptoms are mild, you may be able to  relieve your symptoms by moving air into ("popping") your ears. If you have symptoms of fluid in your ears, treatment may include:  Decongestants.  Antihistamines.  Nasal sprays or ear drops that contain medicines that reduce swelling (steroids).  In some cases, you may need to have a procedure to drain the fluid in your eardrum (myringotomy). In this procedure, a small tube is placed in the eardrum to:  Drain the fluid.  Restore the air in the middle ear space.  Follow these instructions at home:  Take over-the-counter and prescription medicines only as told by your health care provider.  Use techniques to help pop your ears as recommended by your health care provider. These may include: ? Chewing gum. ? Yawning. ? Frequent, forceful swallowing. ? Closing your mouth, holding your nose closed, and gently blowing as if you are trying to blow air out of your nose.  Do not do any of the following until your health care provider approves: ? Travel to high altitudes. ? Fly in airplanes. ? Work in a Pension scheme manager or room. ? Scuba dive.  Keep your ears dry. Dry your ears completely after showering or bathing.  Do not smoke.  Keep all follow-up visits as told by your health care provider. This is important. Contact a health care provider if:  Your symptoms do not go away after treatment.  Your symptoms  come back after treatment.  You are unable to pop your ears.  You have: ? A fever. ? Pain in your ear. ? Pain in your head or neck. ? Fluid draining from your ear.  Your hearing suddenly changes.  You become very dizzy.  You lose your balance. This information is not intended to replace advice given to you by your health care provider. Make sure you discuss any questions you have with your health care provider. Document Released: 11/05/2015 Document Revised: 03/16/2016 Document Reviewed: 10/28/2014 Elsevier Interactive Patient Education  2018 [employer].  Vitamin D Deficiency Vitamin D deficiency is when your body does not have enough vitamin D. Vitamin D is important because:  It helps your body use other minerals that your body needs.  It helps keep your bones strong and healthy.  It may help to prevent some diseases.  It helps your heart and other muscles work well.  You can get vitamin D by:  Eating foods with vitamin D in them.  Drinking or eating milk or other foods that have had vitamin D added to them.  Taking a vitamin D supplement.  Being in the sun.  Not getting enough vitamin D can make your bones become soft. It can also cause other health problems. Follow these instructions at home:  Take medicines and supplements only as told by your doctor.  Eat foods that have vitamin D. These include: ? Dairy products, cereals, or juices with added vitamin D. Check the label for vitamin D. ? Fatty fish like salmon or trout. ? Eggs. ? Oysters.  Do not use tanning beds.  Stay at a healthy weight. Lose weight, if needed.  Keep all follow-up visits as told by your doctor. This is important. Contact a doctor if:  Your symptoms do not go away.  You feel sick to your stomach (nauseous).  Youthrow up (vomit).  You poop less often than usual or you have trouble pooping (constipation). This information is not intended to replace advice given to you by your health care provider. Make sure you discuss any questions you have with your health care provider. Document Released: 09/28/2011 Document Revised: 03/16/2016 Document Reviewed: 02/24/2015 Elsevier Interactive Patient Education  [employer].

## 2017-08-21 ENCOUNTER — Ambulatory Visit: Payer: Self-pay | Admitting: Medical

## 2017-08-21 ENCOUNTER — Encounter: Payer: Self-pay | Admitting: Medical

## 2017-08-21 ENCOUNTER — Other Ambulatory Visit: Payer: Self-pay | Admitting: Medical

## 2017-08-21 VITALS — BP 100/68 | HR 79 | Temp 99.1°F | Resp 16 | Ht 63.0 in | Wt 128.0 lb

## 2017-08-21 DIAGNOSIS — E559 Vitamin D deficiency, unspecified: Secondary | ICD-10-CM

## 2017-08-21 DIAGNOSIS — M419 Scoliosis, unspecified: Secondary | ICD-10-CM | POA: Insufficient documentation

## 2017-08-21 DIAGNOSIS — M21612 Bunion of left foot: Secondary | ICD-10-CM

## 2017-08-21 DIAGNOSIS — Z0000 Encounter for general adult medical examination without abnormal findings: Secondary | ICD-10-CM

## 2017-08-21 NOTE — Progress Notes (Signed)
Subjective:    Patient ID: Madison [name], female    DOB: 12/25/1985, 31 y.o.   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  Sometimes it is sharp pain 3-4/10.  If on feet on a long time or heels for a long time the sharp pain comes on.  See melody Trudee Kuster at [facility] for OB/GYN  last exam  September 05 2016. Review of Systems  Constitutional: Positive for fatigue.  HENT: Positive for congestion (patient feels it is due to high pollen of ragweed), postnasal drip and rhinorrhea.   Eyes: Negative.   Respiratory: Positive for cough (dry). Shortness of breath: Since Oct 8th  feels like pressure , no pain.   Cardiovascular: Positive for leg swelling.  Gastrointestinal: Negative.   Endocrine: Negative.   Genitourinary: Negative.   Musculoskeletal: Negative.   Skin: Negative.   Allergic/Immunologic: Positive for environmental allergies. Negative for food allergies.  Neurological: Positive for headaches (treats with ibuprofen helsps to dull inconvence).  Hematological: Negative.   Psychiatric/Behavioral: Negative.   feels grit in left eye uses saline to rinse and it feels better On period during urine dip . Chest jpressure with just at rest , no change with activity, dry cough Still has hives occasionally , none now.    Objective:   Physical Exam  Constitutional: She is oriented to person, place, and time. Vital signs are normal. She appears well-developed and well-nourished. She is active.  HENT:  Head: Normocephalic and atraumatic.  Right Ear: External ear normal.  Left Ear: External ear normal.  Nose: Nose normal.  Mouth/Throat: Oropharynx is clear and moist.  Eyes: Conjunctivae, EOM and lids are normal. Pupils are equal, round, and reactive to light.  Fundoscopic exam:  The right eye shows red reflex.       The left eye shows red reflex.  Neck: Trachea normal and normal range of motion. Neck supple. Carotid bruit is not present. No thyromegaly present.  Cardiovascular: Normal rate, regular rhythm, normal heart sounds and intact distal pulses. Exam reveals no gallop and no friction rub.  No murmur heard. Pulses:      Radial pulses are 2+ on the right side, and 2+ on the left side.       Popliteal pulses are 2+ on the right side, and 2+ on the left side.       Dorsalis pedis pulses are 2+ on the right side, and 2+ on the left side.       Posterior tibial pulses are 2+ on the right side, and 2+ on the left side.  Pulmonary/Chest: Effort normal and breath sounds normal.  Abdominal: Soft. Normal appearance, normal aorta and bowel sounds are normal.  Musculoskeletal: Normal range of motion.       Feet:  Lymphadenopathy:       Head (right side): No submental, no submandibular, no tonsillar, no preauricular and no posterior auricular adenopathy present.       Head (left side): No submental, no submandibular, no tonsillar, no preauricular and no posterior auricular adenopathy present.    She has no cervical adenopathy.       Right cervical: No superficial cervical, no deep cervical and no posterior cervical adenopathy present.      Left cervical: No superficial cervical, no deep cervical  and no posterior cervical adenopathy present.       Right: No supraclavicular and no epitrochlear adenopathy present.       Left: No epitrochlear adenopathy present.  Neurological: She is alert and oriented to person, place, and time. She has normal strength and normal reflexes. No cranial nerve deficit or sensory deficit. She displays a negative Romberg sign. GCS eye subscore is 4. GCS verbal subscore is 5. GCS motor subscore is 6.  Reflex Scores:      Brachioradialis reflexes are 2+ on the right side and 2+ on the left side.      Patellar reflexes are 2+ on the right side and 2+  on the left side.      Achilles reflexes are 2+ on the right side and 2+ on the left side. Skin: Skin is warm, dry and intact.  Psychiatric: She has a normal mood and affect. Her speech is normal and behavior is normal. Judgment and thought content normal. Cognition and memory are normal.  Nursing note and vitals reviewed.  Well healed scar from cervical to thoracic back from scoliolis surgery Breast exam deferred her OB/GYN does exam and well women exam.      Assessment & Plan:  Primary care exam, Bunion left foot. Vitamin D Deficiency  July  17 HIV blood work  Sep 06 2017 appt with OB/GYN for female exam. D3 2000IU/day Left foot bunion , she just wants to watch it for now and use ibuprofen if painful. If worsening she knows to return to clinic.

## 2017-08-21 NOTE — Patient Instructions (Addendum)
Vitamin D Deficiency Vitamin D deficiency is when your body does not have enough vitamin D. Vitamin D is important because:  It helps your body use other minerals that your body needs.  It helps keep your bones strong and healthy.  It may help to prevent some diseases.  It helps your heart and other muscles work well.  You can get vitamin D by:  Eating foods with vitamin D in them.  Drinking or eating milk or other foods that have had vitamin D added to them.  Taking a vitamin D supplement.  Being in the sun.  Not getting enough vitamin D can make your bones become soft. It can also cause other health problems. Follow these instructions at home:  Take medicines and supplements only as told by your doctor.  Eat foods that have vitamin D. These include: ? Dairy products, cereals, or juices with added vitamin D. Check the label for vitamin D. ? Fatty fish like salmon or trout. ? Eggs. ? Oysters.  Do not use tanning beds.  Stay at a healthy weight. Lose weight, if needed.  Keep all follow-up visits as told by your doctor. This is important. Contact a doctor if:  Your symptoms do not go away.  You feel sick to your stomach (nauseous).  Youthrow up (vomit).  You poop less often than usual or you have trouble pooping (constipation). This information is not intended to replace advice given to you by your health care provider. Make sure you discuss any questions you have with your health care provider. Document Released: 09/28/2011 Document Revised: 03/16/2016 Document Reviewed: 02/24/2015 Elsevier Interactive Patient Education  2018 [place] A bunion is a bump on the base of the big toe that forms when the bones of the big toe joint move out of position. Bunions may be small at first, but they often get larger over time. The can make walking painful. What are the causes? A bunion may be caused by:  Wearing narrow or pointed shoes that force the big toe to  press against the other toes.  Abnormal foot development that causes the foot to roll inward (pronate).  Changes in the foot that are caused by certain diseases, such as rheumatoid arthritis and polio.  A foot injury.  What increases the risk? The following factors may make you more likely to develop this condition:  Wearing shoes that squeeze the toes together.  Having certain diseases, such as: ? Rheumatoid arthritis. ? Polio. ? Cerebral palsy.  Having family members who have bunions.  Being born with a foot deformity, such as flat feet or low arches.  Doing activities that put a lot of pressure on the feet, such as ballet dancing.  What are the signs or symptoms? The main symptom of a bunion is a noticeable bump on the big toe. Other symptoms may include:  Pain.  Swelling around the big toe.  Redness and inflammation.  Thick or hardened skin on the big toe or between the toes.  Stiffness or loss of motion in the big toe.  Trouble with walking.  How is this diagnosed? A bunion may be diagnosed based on your symptoms, medical history, and activities. You may have tests, such as:  X-rays. These allow your health care provider to check the position of the bones in your foot and look for damage to your joint. They also help your health care provider to determine the severity of your bunion and the best way to treat it.  Joint aspiration. In this test, a sample of fluid is removed from the toe joint. This test, which may be done if you are in a lot of pain, helps to rule out diseases that cause painful swelling of the joints, such as arthritis.  How is this treated? There is no cure for a bunion, but treatment can help to prevent a bunion from getting worse. Treatment depends on the severity of your symptoms. Your health care provider may recommend:  Wearing shoes that have a wide toe box.  Using bunion pads to cushion the affected area.  Taping your toes together to  keep them in a normal position.  Placing a device inside your shoe (orthotics) to help reduce pressure on your toe joint.  Taking medicine to ease pain, inflammation, and swelling.  Applying heat or ice to the affected area.  Doing stretching exercises.  Surgery to remove scar tissue and move the toes back into their normal position. This treatment is rare.  Follow these instructions at home:  Support your toe joint with proper footwear, shoe padding, or taping as told by your health care provider.  Take over-the-counter and prescription medicines only as told by your health care provider.  If directed, apply ice to the injured area: ? Put ice in a plastic bag. ? Place a towel between your skin and the bag. ? Leave the ice on for 20 minutes, 2-3 times per day.  If directed, apply heat to the affected area before you exercise. Use the heat source that your health care provider recommends, such as a moist heat pack or a heating pad. ? Place a towel between your skin and the heat source. ? Leave the heat on for 20-30 minutes. ? Remove the heat if your skin turns bright red. This is especially important if you are unable to feel pain, heat, or cold. You may have a greater risk of getting burned.  Do exercises as told by your health care provider.  Keep all follow-up visits as told by your health care provider. Contact a health care provider if:  Your symptoms get worse.  Your symptoms do not improve in 2 weeks. Get help right away if:  You have severe pain and trouble with walking. This information is not intended to replace advice given to you by your health care provider. Make sure you discuss any questions you have with your health care provider. Document Released: 10/09/2005 Document Revised: 03/16/2016 Document Reviewed: 05/09/2015 Elsevier Interactive Patient Education  2018 [place] Breast self-awareness means:  Knowing how your breasts  look.  Knowing how your breasts feel.  Checking your breasts every month for changes.  Telling your doctor if you notice a change in your breasts.  Breast self-awareness allows you to notice a breast problem early while it is still small. How to do a breast self-exam One way to learn what is normal for your breasts and to check for changes is to do a breast self-exam. To do a breast self-exam: Look for Changes  1. Take off all the clothes above your waist. 2. Stand in front of a mirror in a room with good lighting. 3. Put your hands on your hips. 4. Push your hands down. 5. Look at your breasts and nipples in the mirror to see if one breast or nipple looks different than the other. Check to see if: ? The shape of one breast is different. ? The size of one breast is different. ? There  are wrinkles, dips, and bumps in one breast and not the other. 6. Look at each breast for changes in your skin, such as: ? Redness. ? Scaly areas. 7. Look for changes in your nipples, such as: ? Liquid around the nipples. ? Bleeding. ? Dimpling. ? Redness. ? A change in where the nipples are. Feel for Changes 1. Lie on your back on the floor. 2. Feel each breast. To do this, follow these steps: ? Pick a breast to feel. ? Put the arm closest to that breast above your head. ? Use your other arm to feel the nipple area of your breast. Feel the area with the pads of your three middle fingers by making small circles with your fingers. For the first circle, press lightly. For the second circle, press harder. For the third circle, press even harder. ? Keep making circles with your fingers at the light, harder, and even harder pressures as you move down your breast. Stop when you feel your ribs. ? Move your fingers a little toward the center of your body. ? Start making circles with your fingers again, this time going up until you reach your collarbone. ? Keep making up and down circles until you reach  your armpit. Remember to keep using the three pressures. ? Feel the other breast in the same way. 3. Sit or stand in the shower or tub. 4. With soapy water on your skin, feel each breast the same way you did in step 2, when you were lying on the floor. Write Down What You Find  After doing the self-exam, write down:  What is normal for each breast.  Any changes you find in each breast.  When you last had your period.  How often should I check my breasts? Check your breasts every month. If you are breastfeeding, the best time to check them is after you feed your baby or after you use a breast pump. If you get periods, the best time to check your breasts is 5-7 days after your period is over. When should I see my doctor? See your doctor if you notice:  A change in shape or size of your breasts or nipples.  A change in the skin of your breast or nipples, such as red or scaly skin.  Unusual fluid coming from your nipples.  A lump or thick area that was not there before.  Pain in your breasts.  Anything that concerns you.  This information is not intended to replace advice given to you by your health care provider. Make sure you discuss any questions you have with your health care provider. Document Released: 03/27/2008 Document Revised: 03/16/2016 Document Reviewed: 08/29/2015 Elsevier Interactive Patient Education  [employer].

## 2017-08-22 LAB — B12 AND FOLATE PANEL
Folate: 7.5 ng/mL (ref >3.0)
Vitamin B-12: 556 pg/mL (ref 232–1245)

## 2017-08-22 LAB — HIV ANTIBODY (ROUTINE TESTING W REFLEX): HIV SCREEN 4TH GENERATION: NONREACTIVE

## 2017-08-27 ENCOUNTER — Telehealth: Payer: Self-pay

## 2017-08-27 ENCOUNTER — Encounter: Payer: Self-pay | Admitting: Medical

## 2017-08-27 NOTE — Progress Notes (Signed)
Please review lab results with patient. Thank you.Nira Conn

## 2017-08-27 NOTE — Telephone Encounter (Signed)
Left message with front office person at her work extension.  Requested that patient call Clinic back.  Will review negative (normal) lab results of HIV screen, B12 and folate to her at that time.

## 2017-08-28 ENCOUNTER — Telehealth: Payer: Self-pay

## 2017-08-28 NOTE — Telephone Encounter (Signed)
Patient returned phone call.  Reviewed HIV sceening negative and B12  And Folate WNL.  Provider Ratcliffe PA-C had no recommendations on those.  Patient complains of fatigue.  Discussed with PA, advised patient to increase Vitmain D from 2000 iu daily to 4000 iu daily.  Recheck labs in 3 months.  Patient verbalized understanding of POC.

## 2017-09-06 ENCOUNTER — Encounter: Payer: BLUE CROSS/BLUE SHIELD | Admitting: Obstetrics and Gynecology

## 2017-10-10 ENCOUNTER — Encounter: Payer: Self-pay | Admitting: Obstetrics and Gynecology

## 2017-10-10 ENCOUNTER — Ambulatory Visit (INDEPENDENT_AMBULATORY_CARE_PROVIDER_SITE_OTHER): Payer: BLUE CROSS/BLUE SHIELD | Admitting: Obstetrics and Gynecology

## 2017-10-10 ENCOUNTER — Other Ambulatory Visit: Payer: Self-pay | Admitting: Obstetrics and Gynecology

## 2017-10-10 VITALS — BP 118/84 | HR 72 | Ht 63.0 in | Wt 129.0 lb

## 2017-10-10 DIAGNOSIS — E559 Vitamin D deficiency, unspecified: Secondary | ICD-10-CM

## 2017-10-10 DIAGNOSIS — Z30431 Encounter for routine checking of intrauterine contraceptive device: Secondary | ICD-10-CM

## 2017-10-10 DIAGNOSIS — R102 Pelvic and perineal pain: Secondary | ICD-10-CM

## 2017-10-10 DIAGNOSIS — Z01419 Encounter for gynecological examination (general) (routine) without abnormal findings: Secondary | ICD-10-CM

## 2017-10-10 DIAGNOSIS — R5383 Other fatigue: Secondary | ICD-10-CM

## 2017-10-10 NOTE — Addendum Note (Signed)
Addended by: Keturah Barre L on: 10/10/2017 04:28 PM   Modules accepted: Orders

## 2017-10-10 NOTE — Patient Instructions (Signed)
Preventive Care 18-39 Years, Female Preventive care refers to lifestyle choices and visits with your health care provider that can promote health and wellness. What does preventive care include?  A yearly physical exam. This is also called an annual well check.  Dental exams once or twice a year.  Routine eye exams. Ask your health care provider how often you should have your eyes checked.  Personal lifestyle choices, including: ? Daily care of your teeth and gums. ? Regular physical activity. ? Eating a healthy diet. ? Avoiding tobacco and drug use. ? Limiting alcohol use. ? Practicing safe sex. ? Taking vitamin and mineral supplements as recommended by your health care provider. What happens during an annual well check? The services and screenings done by your health care provider during your annual well check will depend on your age, overall health, lifestyle risk factors, and family history of disease. Counseling Your health care provider may ask you questions about your:  Alcohol use.  Tobacco use.  Drug use.  Emotional well-being.  Home and relationship well-being.  Sexual activity.  Eating habits.  Work and work Statistician.  Method of birth control.  Menstrual cycle.  Pregnancy history.  Screening You may have the following tests or measurements:  Height, weight, and BMI.  Diabetes screening. This is done by checking your blood sugar (glucose) after you have not eaten for a while (fasting).  Blood pressure.  Lipid and cholesterol levels. These may be checked every 5 years starting at age 38.  Skin check.  Hepatitis C blood test.  Hepatitis B blood test.  Sexually transmitted disease (STD) testing.  BRCA-related cancer screening. This may be done if you have a family history of breast, ovarian, tubal, or peritoneal cancers.  Pelvic exam and Pap test. This may be done every 3 years starting at age 38. Starting at age 30, this may be done  every 5 years if you have a Pap test in combination with an HPV test.  Discuss your test results, treatment options, and if necessary, the need for more tests with your health care provider. Vaccines Your health care provider may recommend certain vaccines, such as:  Influenza vaccine. This is recommended every year.  Tetanus, diphtheria, and acellular pertussis (Tdap, Td) vaccine. You may need a Td booster every 10 years.  Varicella vaccine. You may need this if you have not been vaccinated.  HPV vaccine. If you are 39 or younger, you may need three doses over 6 months.  Measles, mumps, and rubella (MMR) vaccine. You may need at least one dose of MMR. You may also need a second dose.  Pneumococcal 13-valent conjugate (PCV13) vaccine. You may need this if you have certain conditions and were not previously vaccinated.  Pneumococcal polysaccharide (PPSV23) vaccine. You may need one or two doses if you smoke cigarettes or if you have certain conditions.  Meningococcal vaccine. One dose is recommended if you are age 68-21 years and a first-year college student living in a residence hall, or if you have one of several medical conditions. You may also need additional booster doses.  Hepatitis A vaccine. You may need this if you have certain conditions or if you travel or work in places where you may be exposed to hepatitis A.  Hepatitis B vaccine. You may need this if you have certain conditions or if you travel or work in places where you may be exposed to hepatitis B.  Haemophilus influenzae type b (Hib) vaccine. You may need this  if you have certain risk factors.  Talk to your health care provider about which screenings and vaccines you need and how often you need them. This information is not intended to replace advice given to you by your health care provider. Make sure you discuss any questions you have with your health care provider. Document Released: 12/05/2001 Document Revised:  06/28/2016 Document Reviewed: 08/10/2015 Elsevier Interactive Patient Education  2018 [employer] Inc[employer]  

## 2017-10-10 NOTE — Progress Notes (Signed)
Subjective:   Madison [name] is a 31 y.o. G0P0000 Caucasian female here for a routine well-woman exam.  Patient's last menstrual period was 09/17/2017.    Current complaints:  Severe cramping with menses and extreme fatigue since Skyla, getting worse each month. Also heavier menses, breast tenderness and bloating with menses.  Denies pain with sex and happy with Isla Pence otherwise. Considering removing next year to try for pregnancy. PCP: [facility]       does desire labs  Social History: Sexual: heterosexual Marital Status: married Living situation: with spouse Occupation: unknown occupation Tobacco/alcohol: no tobacco use Illicit drugs: no history of illicit drug use  The following portions of the patient's history were reviewed and updated as appropriate: allergies, current medications, past family history, past medical history, past social history, past surgical history and problem list.  Past Medical History Past Medical History:  Diagnosis Date  . Allergy   . Hx of migraines 01/012003   can tell when they come on, treats with excedrin migraine.  . Scoliosis 10/24/1995  . Vitamin D deficiency     Past Surgical History Past Surgical History:  Procedure Laterality Date  . partial spinal fusion C7-T2 due to scoliosis 03/1998 N/A 04/11/1998  . TONSILLECTOMY      Gynecologic History G0P0000  Patient's last menstrual period was 09/17/2017. Contraception: IUD Last Pap: ?Marland Kitchen Results were: normal   Obstetric History OB History  Gravida Para Term Preterm AB Living  0 0 0 0 0 0  SAB TAB Ectopic Multiple Live Births  0 0 0 0          Current Medications Current Outpatient Medications on File Prior to Visit  Medication Sig Dispense Refill  . cholecalciferol (VITAMIN D) 1000 units tablet Take 4,000 Units by mouth daily.    . Levonorgestrel (SKYLA) 13.5 MG IUD 1 Units by Intrauterine route once. 1 Intra Uterine Device 0  . fexofenadine (ALLEGRA) 30 MG tablet Take 30 mg by  mouth daily.     . montelukast (SINGULAIR) 10 MG tablet Take 1 tablet (10 mg total) by mouth at bedtime. (Patient not taking: Reported on 10/10/2017) 30 tablet 2   No current facility-administered medications on file prior to visit.     Review of Systems Patient denies any headaches, blurred vision, shortness of breath, chest pain, abdominal pain, problems with bowel movements, urination, or intercourse.  Objective:  BP 118/84   Pulse 72   Ht 5' 3"  (1.6 m)   Wt 129 lb (58.5 kg)   LMP 09/17/2017   BMI 22.85 kg/m  Physical Exam  General:  Well developed, well nourished, no acute distress. She is alert and oriented x3. Skin:  Warm and dry Neck:  Midline trachea, no thyromegaly or nodules Cardiovascular: Regular rate and rhythm, no murmur heard Lungs:  Effort normal, all lung fields clear to auscultation bilaterally Breasts:  No dominant palpable mass, retraction, or nipple discharge Abdomen:  Soft, non tender, no hepatosplenomegaly or masses Pelvic:  External genitalia is normal in appearance.  The vagina is normal in appearance. The cervix is bulbous, no CMT. IUD string noted. Thin prep pap is done with HR HPV cotesting. Uterus is felt to be normal size, shape, and contour, but shifted to left.  No adnexal masses or tenderness noted. Extremities:  No swelling or varicosities noted Psych:  She has a normal mood and affect  Assessment:   Healthy well-woman exam Pelvic pain IUD check Vitamin d deficiency fatigue  Plan:  Labs obtained and pelvic  u/s ordered- will follow up accordingly F/U 1 year for AE, or sooner if needed Discussed removal of IUD and possible switch to pills-will discuss with spouse and let me know.   Rockney Ghee, CNM

## 2017-10-11 ENCOUNTER — Ambulatory Visit (INDEPENDENT_AMBULATORY_CARE_PROVIDER_SITE_OTHER): Payer: BLUE CROSS/BLUE SHIELD

## 2017-10-11 DIAGNOSIS — R102 Pelvic and perineal pain: Secondary | ICD-10-CM

## 2017-10-11 DIAGNOSIS — Z30431 Encounter for routine checking of intrauterine contraceptive device: Secondary | ICD-10-CM | POA: Diagnosis not present

## 2017-10-11 LAB — VITAMIN D 25 HYDROXY (VIT D DEFICIENCY, FRACTURES): Vit D, 25-Hydroxy: 65.6 ng/mL (ref 30.0–100.0)

## 2017-10-17 LAB — CYTOLOGY - PAP

## 2017-10-24 ENCOUNTER — Encounter: Payer: Self-pay | Admitting: Obstetrics and Gynecology

## 2017-10-24 ENCOUNTER — Telehealth: Payer: Self-pay | Admitting: *Deleted

## 2017-10-24 NOTE — Telephone Encounter (Signed)
Mailed info to pt 

## 2017-10-24 NOTE — Telephone Encounter (Signed)
-----   Message from Joylene Igo, [place] sent at 10/24/2017 10:10 AM EST ----- Please mail info on neg pap with HPV +

## 2017-10-25 ENCOUNTER — Ambulatory Visit: Payer: Self-pay | Admitting: Adult Health

## 2017-10-26 ENCOUNTER — Telehealth: Payer: Self-pay | Admitting: Obstetrics and Gynecology

## 2017-10-26 ENCOUNTER — Encounter: Payer: Self-pay | Admitting: Adult Health

## 2017-10-26 ENCOUNTER — Ambulatory Visit: Payer: Self-pay | Admitting: Adult Health

## 2017-10-26 VITALS — BP 108/68 | HR 76 | Temp 99.0°F | Resp 16 | Ht 63.0 in | Wt 130.0 lb

## 2017-10-26 DIAGNOSIS — J0111 Acute recurrent frontal sinusitis: Secondary | ICD-10-CM

## 2017-10-26 MED ORDER — NAPROXEN SODIUM 220 MG PO TABS: 440.0000 mg | ORAL_TABLET | Freq: Two times a day (BID) | ORAL | Status: AC | PRN

## 2017-10-26 MED ORDER — SALINE SPRAY 0.65 % NA SOLN: 2.0000 | NASAL | 0 refills | Status: DC

## 2017-10-26 MED ORDER — AMOXICILLIN-POT CLAVULANATE 875-125 MG PO TABS: 1.0000 | ORAL_TABLET | Freq: Two times a day (BID) | ORAL | 0 refills | Status: DC

## 2017-10-26 NOTE — Progress Notes (Signed)
Subjective:    Patient ID: Madison [name], female    DOB: April 06, 1986, 32 y.o.   MRN: [id number]  31y/o married caucasian female established patient here for evaluation sinus congestion, pressure, post nasal drip, ear pressure that started after visiting her mother in laws house Christmas.  Denied sick contacts.  Still taking allegra and singulair but unable to use neti pot water will not pass through nasal passages due to swelling/congestion.  Multicolored mucous coming from nose.  Taking aleve po prn headache has not had a migraine this week.  Last sinus infection this summer cleared with augmentin.      Review of Systems  Constitutional: Negative for activity change, appetite change, chills, diaphoresis, fatigue, fever and unexpected weight change.  HENT: Positive for congestion, ear pain, nosebleeds, postnasal drip and sinus pressure. Negative for dental problem, drooling, ear discharge, facial swelling, hearing loss, mouth sores, rhinorrhea, sneezing, sore throat, tinnitus, trouble swallowing and voice change.   Eyes: Negative for photophobia, pain, discharge, redness, itching and visual disturbance.  Respiratory: Negative for cough, choking, chest tightness, shortness of breath, wheezing and stridor.   Cardiovascular: Negative for chest pain, palpitations and leg swelling.  Gastrointestinal: Negative for abdominal distention, abdominal pain, blood in stool, constipation, diarrhea, nausea and vomiting.  Endocrine: Negative for cold intolerance and heat intolerance.  Genitourinary: Negative for difficulty urinating, dysuria and hematuria.  Musculoskeletal: Negative for arthralgias, back pain, gait problem, joint swelling, myalgias, neck pain and neck stiffness.  Skin: Negative for color change, pallor, rash and wound.  Allergic/Immunologic: Positive for environmental allergies. Negative for food allergies.  Neurological: Positive for headaches. Negative for dizziness, tremors,  seizures, syncope, facial asymmetry, speech difficulty, weakness, light-headedness and numbness.  Hematological: Negative for adenopathy. Does not bruise/bleed easily.  Psychiatric/Behavioral: Negative for agitation, behavioral problems, confusion and sleep disturbance.       Objective:   Physical Exam  Constitutional: She is oriented to person, place, and time. Vital signs are normal. She appears well-developed and well-nourished. She is active and cooperative.  Non-toxic appearance. She does not have a sickly appearance. She appears ill. No distress.  HENT:  Head: Normocephalic and atraumatic.  Right Ear: Hearing, external ear and ear canal normal. A middle ear effusion is present.  Left Ear: Hearing, external ear and ear canal normal. A middle ear effusion is present.  Nose: Mucosal edema and rhinorrhea present. No nose lacerations, sinus tenderness, nasal deformity, septal deviation or nasal septal hematoma. No epistaxis.  No foreign bodies. Right sinus exhibits frontal sinus tenderness. Right sinus exhibits no maxillary sinus tenderness. Left sinus exhibits frontal sinus tenderness. Left sinus exhibits no maxillary sinus tenderness.  Mouth/Throat: Uvula is midline and mucous membranes are normal. Mucous membranes are not pale, not dry and not cyanotic. She does not have dentures. No oral lesions. No trismus in the jaw. Normal dentition. No dental abscesses, uvula swelling, lacerations or dental caries. Posterior oropharyngeal edema and posterior oropharyngeal erythema present. No oropharyngeal exudate or tonsillar abscesses.  Cobblestoning posterior pharynx; bilateral TMs air fluid level clear; frontal sinuses TTP; maxillary sinuses pressure with palpation; bilateral allergic shiners; bilateral nasal turbinates edema/erythema clear discharge  Eyes: Conjunctivae, EOM and lids are normal. Pupils are equal, round, and reactive to light. Right eye exhibits no chemosis, no discharge, no exudate and  no hordeolum. No foreign body present in the right eye. Left eye exhibits no chemosis, no discharge, no exudate and no hordeolum. No foreign body present in the left eye. Right conjunctiva is not  injected. Right conjunctiva has no hemorrhage. Left conjunctiva is not injected. Left conjunctiva has no hemorrhage. No scleral icterus. Right eye exhibits normal extraocular motion and no nystagmus. Left eye exhibits normal extraocular motion and no nystagmus. Right pupil is round and reactive. Left pupil is round and reactive. Pupils are equal.  Neck: Trachea normal, normal range of motion and phonation normal. Neck supple. No tracheal tenderness and no muscular tenderness present. No neck rigidity. No tracheal deviation, no edema, no erythema and normal range of motion present. No thyroid mass and no thyromegaly present.  Cardiovascular: Normal rate, regular rhythm, S1 normal, S2 normal, normal heart sounds and intact distal pulses. PMI is not displaced. Exam reveals no gallop and no friction rub.  No murmur heard. Pulmonary/Chest: Effort normal and breath sounds normal. No accessory muscle usage or stridor. No respiratory distress. She has no decreased breath sounds. She has no wheezes. She has no rhonchi. She has no rales. She exhibits no tenderness.  No cough observed in exam room; spoke full sentences without difficulty  Abdominal: Soft. Normal appearance. She exhibits no distension, no fluid wave and no ascites. There is no rigidity and no guarding.  Musculoskeletal: Normal range of motion. She exhibits no edema or tenderness.       Right shoulder: Normal.       Left shoulder: Normal.       Right elbow: Normal.      Left elbow: Normal.       Right hip: Normal.       Left hip: Normal.       Right knee: Normal.       Left knee: Normal.       Cervical back: Normal.       Thoracic back: Normal.       Lumbar back: Normal.       Right hand: Normal.       Left hand: Normal.  Lymphadenopathy:        Head (right side): No submental, no submandibular, no tonsillar, no preauricular, no posterior auricular and no occipital adenopathy present.       Head (left side): No submental, no submandibular, no tonsillar, no preauricular, no posterior auricular and no occipital adenopathy present.    She has no cervical adenopathy.       Right cervical: No superficial cervical, no deep cervical and no posterior cervical adenopathy present.      Left cervical: No superficial cervical, no deep cervical and no posterior cervical adenopathy present.  Neurological: She is alert and oriented to person, place, and time. She has normal strength. She is not disoriented. She displays no atrophy and no tremor. No cranial nerve deficit or sensory deficit. She exhibits normal muscle tone. She displays no seizure activity. Coordination and gait normal. GCS eye subscore is 4. GCS verbal subscore is 5. GCS motor subscore is 6.  On/off exam table without difficulty; gait sure and steady in hallway  Skin: Skin is warm, dry and intact. No abrasion, no bruising, no burn, no ecchymosis, no laceration, no lesion, no petechiae and no rash noted. She is not diaphoretic. No cyanosis or erythema. No pallor. Nails show no clubbing.  Psychiatric: She has a normal mood and affect. Her speech is normal and behavior is normal. Judgment and thought content normal. Cognition and memory are normal.  Nursing note and vitals reviewed.         Assessment & Plan:  A-acute recurrent frontal sinusitis  P-Restart saline 2 sprays each nostril  q2h wa prn congestion.  If no improvement with 48 hours of saline and flonase use start augmentin 877m po BID x 10 days #20 RF0.  Electronic Rx given.  Continue singulair and allegra po daily. Tylenol 10061mpo QID prn or aleve 50054mo BID prn pain/fever OTC at home.  Denied personal or family history of ENT cancer.  Shower BID especially prior to bed. No evidence of systemic bacterial infection, non toxic  and well hydrated.  I do not see where any further testing or imaging is necessary at this time.   I will suggest supportive care, rest, good hygiene and encourage the patient to take adequate fluids.  The patient is to return to clinic or EMERGENCY ROOM if symptoms worsen or change significantly.  Exitcare handout on sinusitis and sinus rinse given to patient.  Patient verbalized agreement and understanding of treatment plan and had no further questions at this time.   P2:  Hand washing and cover cough

## 2017-10-26 NOTE — Patient Instructions (Signed)
Otitis Media With Effusion, Pediatric Otitis media with effusion (OME) occurs when there is inflammation of the middle ear and fluid in the middle ear space. There are no signs and symptoms of infection. The middle ear space contains air and the bones for hearing. Air in the middle ear space helps to transmit sound to the brain. OME is a common condition in children, and it often occurs after an ear infection. This condition may be present for several weeks or longer after an ear infection. Most cases of this condition get better on their own. What are the causes? OME is caused by a blockage of the eustachian tube in one or both ears. These tubes drain fluid in the ears to the back of the nose (nasopharynx). If the tissue in the tube swells up (edema), the tube closes. This prevents fluid from draining. Blockage can be caused by:  Ear infections.  Colds and other upper respiratory infections.  Allergies.  Irritants, such as tobacco smoke.  Enlarged adenoids. The adenoids are areas of soft tissue located high in the back of the throat, behind the nose and the roof of the mouth. They are part of the body's natural defense (immune) system.  A mass in the nasopharynx.  Damage to the ear caused by pressure changes (barotrauma).  What increases the risk? Your child is more likely to develop this condition if:  He or she has repeated ear and sinus infections.  He or she has allergies.  He or she is exposed to tobacco smoke.  He or she attends daycare.  He or she is not breastfed.  What are the signs or symptoms? Symptoms of this condition may not be obvious. Sometimes this condition does not have any symptoms, or symptoms may overlap with those of a cold or upper respiratory tract illness. Symptoms of this condition include:  Temporary hearing loss.  A feeling of fullness in the ear without pain.  Irritability or agitation.  Balance (vestibular) problems.  As a result of hearing  loss, your child may:  Listen to the TV at a loud volume.  Not respond to questions.  Ask "What?" often when spoken to.  Mistake or confuse one sound or word for another.  Perform poorly at school.  Have a poor attention span.  Become agitated or irritated easily.  How is this diagnosed? This condition is diagnosed with an ear exam. Your child's health care provider will look inside your child's ear with an instrument (otoscope) to check for redness, swelling, and fluid. Other tests may be done, including:  A test to check the movement of the eardrum (pneumatic otoscopy). This is done by squeezing a small amount of air into the ear.  A test that changes air pressure in the middle ear to check how well the eardrum moves and to see if the eustachian tube is working (tympanogram).  Hearing test (audiogram). This test involves playing tones at different pitches to see if your child can hear each tone.  How is this treated? Treatment for this condition depends on the cause. In many cases, the fluid goes away on its own. In some cases, your child may need a procedure to create a hole in the eardrum to allow fluid to drain (myringotomy) and to insert small drainage tubes (tympanostomy tubes) into the eardrums. These tubes help to drain fluid and prevent infection. This procedure may be recommended if:  OME does not get better over several months.  Your child has many ear  infections within several months.  Your child has noticeable hearing loss.  Your child has problems with speech and language development.  Surgery may also be done to remove the adenoids (adenoidectomy). Follow these instructions at home:  Give over-the-counter and prescription medicines only as told by your child's health care provider.  Keep children away from any tobacco smoke.  Keep all follow-up visits as told by your child's health care provider. This is important. How is this prevented?  Keep your  child's vaccinations up to date. Make sure your child gets all recommended vaccinations, including a pneumonia and flu vaccine.  Encourage hand washing. Your child should wash his or her hands often with soap and water. If there is no soap and water, he or she should use hand sanitizer.  Avoid exposing your child to tobacco smoke.  Breastfeed your baby, if possible. Babies who are breastfed as long as possible are less likely to develop this condition. Contact a health care provider if:  Your child's hearing does not get better after 3 months.  Your child's hearing is worse.  Your child has ear pain.  Your child has a fever.  Your child has drainage from the ear.  Your child is dizzy.  Your child has a lump on his or her neck. Get help right away if:  Your child has bleeding from the nose.  Your child cannot move part of her or his face.  Your child has trouble breathing.  Your child cannot smell.  Your child develops severe congestion.  Your child develops weakness.  Your child who is younger than 3 months has a temperature of 100F (38C) or higher. Summary  Otitis media with effusion (OME) occurs when there is inflammation of the middle ear and fluid in the middle ear space.  This condition is caused by blockage of one or both eustachian tubes, which drain fluid in the ears to the back of the nose.  Symptoms of this condition can include temporary hearing loss, a feeling of fullness in the ear, irritability or agitation, and balance (vestibular) problems. Sometimes, there are no symptoms.  This condition is diagnosed with an ear exam and tests, such as pneumatic otoscopy, tympanogram, and audiogram.  Treatment for this condition depends on the cause. In many cases, the fluid goes away on its own. This information is not intended to replace advice given to you by your health care provider. Make sure you discuss any questions you have with your health care  provider. Document Released: 12/30/2003 Document Revised: 08/31/2016 Document Reviewed: 08/31/2016 Elsevier Interactive Patient Education  2017 [employer] Sinus Rinse What is a sinus rinse? A sinus rinse is a simple home treatment that is used to rinse your sinuses with a sterile mixture of salt and water (saline solution). Sinuses are air-filled spaces in your skull behind the bones of your face and forehead that open into your nasal cavity. You will use the following:  Saline solution.  Neti pot or spray bottle. This releases the saline solution into your nose and through your sinuses. Neti pots and spray bottles can be purchased at [employer], a health food store, or online.  When would I do a sinus rinse? A sinus rinse can help to clear mucus, dirt, dust, or pollen from the nasal cavity. You may do a sinus rinse when you have a cold, a virus, nasal allergy symptoms, a sinus infection, or stuffiness in the nose or sinuses. If you are considering a sinus rinse:  Ask your child's health care provider before performing a sinus rinse on your child.  Do not do a sinus rinse if you have had ear or nasal surgery, ear infection, or blocked ears.  How do I do a sinus rinse?  Wash your hands.  Disinfect your device according to the directions provided and then dry it.  Use the solution that comes with your device or one that is sold separately in stores. Follow the mixing directions on the package.  Fill your device with the amount of saline solution as directed by the device instructions.  Stand over a sink and tilt your head sideways over the sink.  Place the spout of the device in your upper nostril (the one closer to the ceiling).  Gently pour or squeeze the saline solution into the nasal cavity. The liquid should drain to the lower nostril if you are not overly congested.  Gently blow your nose. Blowing too hard may cause ear pain.  Repeat in the other  nostril.  Clean and rinse your device with clean water and then air-dry it. Are there risks of a sinus rinse? Sinus rinse is generally very safe and effective. However, there are a few risks, which include:  A burning sensation in the sinuses. This may happen if you do not make the saline solution as directed. Make sure to follow all directions when making the saline solution.  Infection from contaminated water. This is rare, but possible.  Nasal irritation.  This information is not intended to replace advice given to you by your health care provider. Make sure you discuss any questions you have with your health care provider. Document Released: 05/06/2014 Document Revised: 09/05/2016 Document Reviewed: 02/24/2014 Elsevier Interactive Patient Education  2017 [employer] Sinusitis, Adult Sinusitis is soreness and inflammation of your sinuses. Sinuses are hollow spaces in the bones around your face. Your sinuses are located:  Around your eyes.  In the middle of your forehead.  Behind your nose.  In your cheekbones.  Your sinuses and nasal passages are lined with a stringy fluid (mucus). Mucus normally drains out of your sinuses. When your nasal tissues become inflamed or swollen, the mucus can become trapped or blocked so air cannot flow through your sinuses. This allows bacteria, viruses, and funguses to grow, which leads to infection. Sinusitis can develop quickly and last for 7?10 days (acute) or for more than 12 weeks (chronic). Sinusitis often develops after a cold. What are the causes? This condition is caused by anything that creates swelling in the sinuses or stops mucus from draining, including:  Allergies.  Asthma.  Bacterial or viral infection.  Abnormally shaped bones between the nasal passages.  Nasal growths that contain mucus (nasal polyps).  Narrow sinus openings.  Pollutants, such as chemicals or irritants in the air.  A foreign object stuck in the  nose.  A fungal infection. This is rare.  What increases the risk? The following factors may make you more likely to develop this condition:  Having allergies or asthma.  Having had a recent cold or respiratory tract infection.  Having structural deformities or blockages in your nose or sinuses.  Having a weak immune system.  Doing a lot of swimming or diving.  Overusing nasal sprays.  Smoking.  What are the signs or symptoms? The main symptoms of this condition are pain and a feeling of pressure around the affected sinuses. Other symptoms include:  Upper toothache.  Earache.  Headache.  Bad breath.  Decreased  sense of smell and taste.  A cough that may get worse at night.  Fatigue.  Fever.  Thick drainage from your nose. The drainage is often green and it may contain pus (purulent).  Stuffy nose or congestion.  Postnasal drip. This is when extra mucus collects in the throat or back of the nose.  Swelling and warmth over the affected sinuses.  Sore throat.  Sensitivity to light.  How is this diagnosed? This condition is diagnosed based on symptoms, a medical history, and a physical exam. To find out if your condition is acute or chronic, your health care provider may:  Look in your nose for signs of nasal polyps.  Tap over the affected sinus to check for signs of infection.  View the inside of your sinuses using an imaging device that has a light attached (endoscope).  If your health care provider suspects that you have chronic sinusitis, you may also:  Be tested for allergies.  Have a sample of mucus taken from your nose (nasal culture) and checked for bacteria.  Have a mucus sample examined to see if your sinusitis is related to an allergy.  If your sinusitis does not respond to treatment and it lasts longer than 8 weeks, you may have an MRI or CT scan to check your sinuses. These scans also help to determine how severe your infection is. In  rare cases, a bone biopsy may be done to rule out more serious types of fungal sinus disease. How is this treated? Treatment for sinusitis depends on the cause and whether your condition is chronic or acute. If a virus is causing your sinusitis, your symptoms will go away on their own within 10 days. You may be given medicines to relieve your symptoms, including:  Topical nasal decongestants. They shrink swollen nasal passages and let mucus drain from your sinuses.  Antihistamines. These drugs block inflammation that is triggered by allergies. This can help to ease swelling in your nose and sinuses.  Topical nasal corticosteroids. These are nasal sprays that ease inflammation and swelling in your nose and sinuses.  Nasal saline washes. These rinses can help to get rid of thick mucus in your nose.  If your condition is caused by bacteria, you will be given an antibiotic medicine. If your condition is caused by a fungus, you will be given an antifungal medicine. Surgery may be needed to correct underlying conditions, such as narrow nasal passages. Surgery may also be needed to remove polyps. Follow these instructions at home: Medicines  Take, use, or apply over-the-counter and prescription medicines only as told by your health care provider. These may include nasal sprays.  If you were prescribed an antibiotic medicine, take it as told by your health care provider. Do not stop taking the antibiotic even if you start to feel better. Hydrate and Humidify  Drink enough water to keep your urine clear or pale yellow. Staying hydrated will help to thin your mucus.  Use a cool mist humidifier to keep the humidity level in your home above 50%.  Inhale steam for 10-15 minutes, 3-4 times a day or as told by your health care provider. You can do this in the bathroom while a hot shower is running.  Limit your exposure to cool or dry air. Rest  Rest as much as possible.  Sleep with your head raised  (elevated).  Make sure to get enough sleep each night. General instructions  Apply a warm, moist washcloth to your face 3-4  times a day or as told by your health care provider. This will help with discomfort.  Wash your hands often with soap and water to reduce your exposure to viruses and other germs. If soap and water are not available, use hand sanitizer.  Do not smoke. Avoid being around people who are smoking (secondhand smoke).  Keep all follow-up visits as told by your health care provider. This is important. Contact a health care provider if:  You have a fever.  Your symptoms get worse.  Your symptoms do not improve within 10 days. Get help right away if:  You have a severe headache.  You have persistent vomiting.  You have pain or swelling around your face or eyes.  You have vision problems.  You develop confusion.  Your neck is stiff.  You have trouble breathing. This information is not intended to replace advice given to you by your health care provider. Make sure you discuss any questions you have with your health care provider. Document Released: 10/09/2005 Document Revised: 06/04/2016 Document Reviewed: 08/04/2015 Elsevier Interactive Patient Education  [employer].

## 2017-10-26 NOTE — Telephone Encounter (Signed)
The patient called and stated that she would like for her nurse or physician to give her a call to discuss her most recent u/s. Please advise.

## 2017-10-30 ENCOUNTER — Telehealth: Payer: Self-pay | Admitting: Obstetrics and Gynecology

## 2017-10-30 NOTE — Telephone Encounter (Signed)
pls advise

## 2017-10-30 NOTE — Telephone Encounter (Signed)
(  Message from 10/26/17 resent):   The patient called and stated that she would like for her nurse or physician to give her a call to discuss her most recent u/s. Please advise.

## 2017-10-30 NOTE — Telephone Encounter (Signed)
Please mail info on PCOS and make sure she got the info on HPV.  After she reviews it if she has more questions have her make a follow up appt with one of midwives to discuss face to face.

## 2017-10-31 ENCOUNTER — Encounter: Payer: Self-pay | Admitting: Obstetrics and Gynecology

## 2017-10-31 ENCOUNTER — Ambulatory Visit (INDEPENDENT_AMBULATORY_CARE_PROVIDER_SITE_OTHER): Payer: BLUE CROSS/BLUE SHIELD | Admitting: Obstetrics and Gynecology

## 2017-10-31 VITALS — BP 114/81 | HR 76 | Wt 128.2 lb

## 2017-10-31 DIAGNOSIS — E282 Polycystic ovarian syndrome: Secondary | ICD-10-CM | POA: Diagnosis not present

## 2017-10-31 DIAGNOSIS — Z30432 Encounter for removal of intrauterine contraceptive device: Secondary | ICD-10-CM

## 2017-10-31 NOTE — Patient Instructions (Addendum)
Polycystic Ovarian Syndrome Polycystic ovarian syndrome (PCOS) is a common hormonal disorder among women of reproductive age. In most women with PCOS, many small fluid-filled sacs (cysts) grow on the ovaries, and the cysts are not part of a normal menstrual cycle. PCOS can cause problems with your menstrual periods and make it difficult to get pregnant. It can also cause an increased risk of miscarriage with pregnancy. If it is not treated, PCOS can lead to serious health problems, such as diabetes and heart disease. What are the causes? The cause of PCOS is not known, but it may be the result of a combination of certain factors, such as:  Irregular menstrual cycle.  High levels of certain hormones (androgens).  Problems with the hormone that helps to control blood sugar (insulin resistance).  Certain genes.  What increases the risk? This condition is more likely to develop in women who have a family history of PCOS. What are the signs or symptoms? Symptoms of PCOS may include:  Multiple ovarian cysts.  Infrequent periods or no periods.  Periods that are too frequent or too heavy.  Unpredictable periods.  Inability to get pregnant (infertility) because of not ovulating.  Increased growth of hair on the face, chest, stomach, back, thumbs, thighs, or toes.  Acne or oily skin. Acne may develop during adulthood, and it may not respond to treatment.  Pelvic pain.  Weight gain or obesity.  Patches of thickened and dark brown or black skin on the neck, arms, breasts, or thighs (acanthosis nigricans).  Excess hair growth on the face, chest, abdomen, or upper thighs (hirsutism).  How is this diagnosed? This condition is diagnosed based on:  Your medical history.  A physical exam, including a pelvic exam. Your health care provider may look for areas of increased hair growth on your skin.  Tests, such as: ? Ultrasound. This may be used to examine the ovaries and the lining of the  uterus (endometrium) for cysts. ? Blood tests. These may be used to check levels of sugar (glucose), female hormone (testosterone), and female hormones (estrogen and progesterone) in your blood.  How is this treated? There is no cure for PCOS, but treatment can help to manage symptoms and prevent more health problems from developing. Treatment varies depending on:  Your symptoms.  Whether you want to have a baby or whether you need birth control (contraception).  Treatment may include nutrition and lifestyle changes along with:  Progesterone hormone to start a menstrual period.  Birth control pills to help you have regular menstrual periods.  Medicines to make you ovulate, if you want to get pregnant.  Medicine to reduce excessive hair growth.  Surgery, in severe cases. This may involve making small holes in one or both of your ovaries. This decreases the amount of testosterone that your body produces.  Follow these instructions at home:  Take over-the-counter and prescription medicines only as told by your health care provider.  Follow a healthy meal plan. This can help you reduce the effects of PCOS. ? Eat a healthy diet that includes lean proteins, complex carbohydrates, fresh fruits and vegetables, low-fat dairy products, and healthy fats. Make sure to eat enough fiber.  If you are overweight, lose weight as told by your health care provider. ? Losing 10% of your body weight may improve symptoms. ? Your health care provider can determine how much weight loss is best for you and can help you lose weight safely.  Keep all follow-up visits as told by  your health care provider. This is important. Contact a health care provider if:  Your symptoms do not get better with medicine.  You develop new symptoms. This information is not intended to replace advice given to you by your health care provider. Make sure you discuss any questions you have with your health care  provider. Document Released: 02/02/2005 Document Revised: 06/06/2016 Document Reviewed: 03/26/2016 Elsevier Interactive Patient Education  2018 [employer].  Diet for Polycystic Ovarian Syndrome Polycystic ovary syndrome (PCOS) is a disorder of the chemical messengers (hormones) that regulate menstruation. The condition causes important hormones to be out of balance. PCOS can:  Make your periods irregular or stop.  Cause cysts to develop on the ovaries.  Make it difficult to get pregnant.  Stop your body from responding to the effects of insulin (insulin resistance), which can lead to obesity and diabetes.  Changing what you eat can help manage PCOS and improve your health. It can help you lose weight and improve the way your body uses insulin. What is my plan?  Eat breakfast, lunch, and dinner plus two snacks every day.  Include protein in each meal and snack.  Choose whole grains instead of products made with refined flour.  Eat a variety of foods.  Exercise regularly as told by your health care provider. What do I need to know about this eating plan? If you are overweight or obese, pay attention to how many calories you eat. Cutting down on calories can help you lose weight. Work with your health care provider or dietitian to figure out how many calories you need each day. What foods can I eat? Grains Whole grains, such as whole wheat. Whole-grain breads, crackers, cereals, and pasta. Unsweetened oatmeal, bulgur, barley, quinoa, or brown rice. Corn or whole-wheat flour tortillas. Vegetables  Lettuce. Spinach. Peas. Beets. Cauliflower. Cabbage. Broccoli. Carrots. Tomatoes. Squash. Eggplant. Herbs. Peppers. Onions. Cucumbers. Brussels sprouts. Fruits Berries. Bananas. Apples. Oranges. Grapes. Papaya. Mango. Pomegranate. Kiwi. Grapefruit. Cherries. Meats and Other Protein Sources Lean proteins, such as fish, chicken, beans, eggs, and tofu. Dairy Low-fat dairy products, such  as skim milk, cheese sticks, and yogurt. Beverages Low-fat or fat-free drinks, such as water, low-fat milk, sugar-free drinks, and 100% fruit juice. Condiments Ketchup. Mustard. Barbecue sauce. Relish. Low-fat or fat-free mayonnaise. Fats and Oils Olive oil or canola oil. Walnuts and almonds. The items listed above may not be a complete list of recommended foods or beverages. Contact your dietitian for more options. What foods are not recommended? Foods high in calories or fat. Fried foods. Sweets. Products made from refined white flour, including white bread, pastries, white rice, and pasta. The items listed above may not be a complete list of foods and beverages to avoid. Contact your dietitian for more information. This information is not intended to replace advice given to you by your health care provider. Make sure you discuss any questions you have with your health care provider. Document Released: 01/31/2016 Document Revised: 03/16/2016 Document Reviewed: 10/21/2014 Elsevier Interactive Patient Education  2018 [employer].

## 2017-10-31 NOTE — Progress Notes (Signed)
Subjective:     Patient ID: Madison [name], female   DOB: 1986-10-10, 32 y.o.   MRN: [id number]  HPI: pt present today with partner for discussion of HPV positive labs results, and to discuss [place] results in relation to premenstrual s/sx. Pt concerned about infertility, with new dx of PCOS, as well as spreading HPV to unborn child, and husband. Pt wants children in the future.  LMP was 10/18/2017. Pt was on Lo BC before IUD, but did not tolerated well, pt stated she had no menses for three years.   Here to discuss labs and pap and ultrasound findings.  Review of Systems     Objective:   Physical Exam A&O x4 Well groomed in no distress Blood pressure 114/81, pulse 76, weight 128 lb 3.2 oz (58.2 kg), last menstrual period 10/26/2017. Body mass index is 22.71 kg/m.    Assessment:   HPV  PCOS Contraception counseling    Plan:   HPV : repeat pap with HPV in one year PCOS:  : Pt to schedule appointment for removal of IUD. Will write script for labs to evaluate for insulin resistance. To follow PCOS diet plan. Exercise. Will think about metformin when pt ready to become pregnant.  Contraception counseling : Removed IUD today.  Will start monophasic BC. Will stop BC when pt ready to get pregnant.

## 2017-11-02 ENCOUNTER — Other Ambulatory Visit: Payer: Self-pay

## 2017-11-02 DIAGNOSIS — E282 Polycystic ovarian syndrome: Secondary | ICD-10-CM

## 2017-11-03 LAB — COMPREHENSIVE METABOLIC PANEL
A/G RATIO: 1.5 (ref 1.2–2.2)
ALT: 15 IU/L (ref 0–32)
AST: 18 IU/L (ref 0–40)
Albumin: 4.6 g/dL (ref 3.5–5.5)
Alkaline Phosphatase: 52 IU/L (ref 39–117)
BILIRUBIN TOTAL: 0.2 mg/dL (ref 0.0–1.2)
BUN/Creatinine Ratio: 18 (ref 9–23)
BUN: 11 mg/dL (ref 6–20)
CHLORIDE: 102 mmol/L (ref 96–106)
CO2: 24 mmol/L (ref 20–29)
Calcium: 9.8 mg/dL (ref 8.7–10.2)
Creatinine, Ser: 0.62 mg/dL (ref 0.57–1.00)
GFR calc non Af Amer: 120 mL/min/{1.73_m2} (ref >59)
GFR, EST AFRICAN AMERICAN: 138 mL/min/{1.73_m2} (ref >59)
GLUCOSE: 79 mg/dL (ref 65–99)
Globulin, Total: 3.1 g/dL (ref 1.5–4.5)
POTASSIUM: 4.5 mmol/L (ref 3.5–5.2)
Sodium: 139 mmol/L (ref 134–144)
Total Protein: 7.7 g/dL (ref 6.0–8.5)

## 2017-11-03 LAB — HEMOGLOBIN A1C
ESTIMATED AVERAGE GLUCOSE: 100 mg/dL
Hgb A1c MFr Bld: 5.1 % (ref 4.8–5.6)

## 2017-11-03 LAB — PROLACTIN: Prolactin: 9.1 ng/mL (ref 4.8–23.3)

## 2017-11-03 LAB — TESTOSTERONE, FREE, TOTAL, SHBG
Sex Hormone Binding: 130.3 nmol/L — ABNORMAL HIGH (ref 24.6–122.0)
TESTOSTERONE FREE: 3.1 pg/mL (ref 0.0–4.2)
Testosterone: 39 ng/dL (ref 8–48)

## 2017-11-03 LAB — DHEA-SULFATE: DHEA-SO4: 160.8 ug/dL (ref 84.8–378.0)

## 2017-11-03 LAB — PROGESTERONE: Progesterone: 0.2 ng/mL

## 2017-11-03 LAB — INSULIN, RANDOM: INSULIN: 5.4 u[IU]/mL (ref 2.6–24.9)

## 2017-11-08 ENCOUNTER — Encounter: Payer: Self-pay | Admitting: Obstetrics and Gynecology

## 2017-12-04 ENCOUNTER — Encounter: Payer: Self-pay | Admitting: Obstetrics and Gynecology

## 2017-12-04 ENCOUNTER — Other Ambulatory Visit: Payer: Self-pay | Admitting: *Deleted

## 2017-12-04 MED ORDER — NORETHIN-ETH ESTRAD-FE BIPHAS 1 MG-10 MCG / 10 MCG PO TABS: 1.0000 | ORAL_TABLET | Freq: Every day | ORAL | 4 refills | Status: DC

## 2017-12-17 ENCOUNTER — Encounter: Payer: Self-pay | Admitting: *Deleted

## 2017-12-21 ENCOUNTER — Other Ambulatory Visit: Payer: Self-pay | Admitting: *Deleted

## 2017-12-21 ENCOUNTER — Telehealth: Payer: Self-pay | Admitting: Obstetrics and Gynecology

## 2017-12-21 MED ORDER — NORGESTIMATE-ETH ESTRADIOL 0.25-35 MG-MCG PO TABS
1.0000 | ORAL_TABLET | Freq: Every day | ORAL | 11 refills | Status: DC
Start: 2017-12-21 — End: 2018-02-04

## 2017-12-21 MED FILL — FEMYNOR 0.25-35 MG-MCG TABS: 0.25-35 | 84 days supply | Qty: 84 | Fill #0

## 2017-12-21 NOTE — Telephone Encounter (Signed)
New rx was sent in

## 2017-12-21 NOTE — Telephone Encounter (Signed)
The patient called and stated that her medication was denied and she is currently out of her medication, The patient would like to speak with Amy in regards to this issue, No other information was disclosed. Please advise.

## 2018-01-03 MED FILL — LO LOESTRIN FE 1-10 TABLET: 1 MG-10 MCG | 84 days supply | Qty: 84 | Fill #0

## 2018-01-21 NOTE — Progress Notes (Signed)
[facility] [address] [place], [place] [address] Phone: [phone] Subjective:     CC: Left finger pain  WER:[id number]  Madison [name] is a 32 y.o. female coming in with complaint of left finger pain.  Patient states she slammed her finger in the door. Swelling. Initially her finger turned white and was numb. She states ice burns. Has a finger splint.  Onset- 3 weeks ago Location- Proximal Duration-continuous for 3 weeks Character- Dull Aggravating factors- Cold Reliving factors-patient has been splinting it Therapies tried- Ibuprofen Severity-initially 8 out of 10 but now 3 out of 10     Past Medical History:  Diagnosis Date  . Allergy   . Hx of migraines 01/012003   can tell when they come on, treats with excedrin migraine.  . Scoliosis 10/24/1995  . Vitamin D deficiency    Past Surgical History:  Procedure Laterality Date  . partial spinal fusion C7-T2 due to scoliosis 03/1998 N/A 04/11/1998  . TONSILLECTOMY     Social History   Socioeconomic History  . Marital status: Married    Spouse name: Not on file  . Number of children: Not on file  . Years of education: Not on file  . Highest education level: Not on file  Occupational History  . Not on file  Social Needs  . Financial resource strain: Not on file  . Food insecurity:    Worry: Not on file    Inability: Not on file  . Transportation needs:    Medical: Not on file    Non-medical: Not on file  Tobacco Use  . Smoking status: Never Smoker  . Smokeless tobacco: Never Used  Substance and Sexual Activity  . Alcohol use: No  . Drug use: No  . Sexual activity: Yes    Birth control/protection: IUD    Comment: skyla  Lifestyle  . Physical activity:    Days per week: Not on file    Minutes per session: Not on file  . Stress: Not on file  Relationships  . Social connections:    Talks on phone: Not on file    Gets together: Not on file    Attends religious  service: Not on file    Active member of club or organization: Not on file    Attends meetings of clubs or organizations: Not on file    Relationship status: Not on file  Other Topics Concern  . Not on file  Social History Narrative  . Not on file   Allergies  Allergen Reactions  . Prednisone     Tingling in hands and face   Family History  Problem Relation Age of Onset  . Breast cancer Maternal Grandmother   . Prostate cancer Maternal Grandfather   . Brain cancer Paternal Grandmother   . Melanoma Paternal Grandfather   . Colon cancer Paternal Aunt      Past medical history, social, surgical and family history all reviewed in electronic medical record.  No pertanent information unless stated regarding to the chief complaint.   Review of Systems:Review of systems updated and as accurate as of 01/22/18  No headache, visual changes, nausea, vomiting, diarrhea, constipation, dizziness, abdominal pain, skin rash, fevers, chills, night sweats, weight loss, swollen lymph nodes, body aches, joint swelling, chest pain, shortness of breath, mood changes.  Positive muscle aches  Objective  Blood pressure 102/74, pulse 86, height 5' 3"  (1.6 m), weight 130 lb (59 kg), SpO2 98 %. Systems examined  below as of 01/22/18   General: No apparent distress alert and oriented x3 mood and affect normal, dressed appropriately.  HEENT: Pupils equal, extraocular movements intact  Respiratory: Patient's speak in full sentences and does not appear short of breath  Cardiovascular: No lower extremity edema, non tender, no erythema  Skin: Warm dry intact with no signs of infection or rash on extremities or on axial skeleton.  Abdomen: Soft nontender  Neuro: Cranial nerves II through XII are intact, neurovascularly intact in all extremities with 2+ DTRs and 2+ pulses.  Lymph: No lymphadenopathy of posterior or anterior cervical chain or axillae bilaterally.  Gait normal with good balance and coordination.    MSK:  Non tender with full range of motion and good stability and symmetric strength and tone of shoulders, elbows, wrist, hip, knee and ankles bilaterally.  Left index finger shows the patient does have some mild swelling at the PIP as well as the MP joint of the flexor side.  Limited muscular skeletal ultrasound was performed and interpreted by Lyndal Pulley  Limited ultrasound of patient's index finger shows the patient does have a volar plate fracture noted.  Avulsion noted.  Does have displacement.  Flexor tendon though is intact. Impression: Volar plate fracture    Impression and Recommendations:     This case required medical decision making of moderate complexity.      Note: This dictation was prepared with Dragon dictation along with smaller phrase technology. Any transcriptional errors that result from this process are unintentional.

## 2018-01-22 ENCOUNTER — Ambulatory Visit: Payer: Self-pay | Admitting: Family Medicine

## 2018-01-22 ENCOUNTER — Ambulatory Visit: Payer: 59 | Admitting: Family Medicine

## 2018-01-22 ENCOUNTER — Encounter: Payer: Self-pay | Admitting: Family Medicine

## 2018-01-22 ENCOUNTER — Ambulatory Visit: Payer: Self-pay

## 2018-01-22 VITALS — BP 102/74 | HR 86 | Ht 63.0 in | Wt 130.0 lb

## 2018-01-22 DIAGNOSIS — S63639A Sprain of interphalangeal joint of unspecified finger, initial encounter: Secondary | ICD-10-CM | POA: Diagnosis not present

## 2018-01-22 DIAGNOSIS — M79645 Pain in left finger(s): Secondary | ICD-10-CM

## 2018-01-22 MED ORDER — VITAMIN D (ERGOCALCIFEROL) 1.25 MG (50000 UNIT) PO CAPS: 50000.0000 [IU] | ORAL_CAPSULE | ORAL | 0 refills | Status: DC

## 2018-01-22 MED FILL — VIT D2 1.25 MG (50,000 UNIT: 1.25 MG | 84 days supply | Qty: 12 | Fill #0

## 2018-01-22 NOTE — Assessment & Plan Note (Signed)
Volar plate injury.  Discussed bracing at 45 degrees.  We discussed that this likely will heal.  Started once weekly vitamin D.  Follow-up again in 3 weeks

## 2018-01-22 NOTE — Patient Instructions (Signed)
Good to meet you  Avulsion of the volar plate of the finger.  You will survive  Try to wear the splint 23 hours a day for next 3 weeks Once weekly vitamin D for 12 weeks See me again in 3 weeks to make sure we are making progress.

## 2018-02-04 ENCOUNTER — Ambulatory Visit: Payer: 59 | Admitting: Family Medicine

## 2018-02-04 ENCOUNTER — Encounter: Payer: Self-pay | Admitting: Family Medicine

## 2018-02-04 VITALS — BP 120/66 | HR 67 | Temp 98.4°F | Resp 12 | Ht 63.0 in | Wt 131.0 lb

## 2018-02-04 DIAGNOSIS — K9041 Non-celiac gluten sensitivity: Secondary | ICD-10-CM | POA: Insufficient documentation

## 2018-02-04 DIAGNOSIS — Z0000 Encounter for general adult medical examination without abnormal findings: Secondary | ICD-10-CM

## 2018-02-04 NOTE — Patient Instructions (Addendum)
A few things to remember from today's visit:   Non-celiac gluten sensitivity  Continue avoiding gluten intake. Let me know if you are interested in going to immunologist in the future. I can see you every 1-2 years for your routine physical.   Please be sure medication list is accurate. If a new problem present, please set up appointment sooner than planned today.

## 2018-02-04 NOTE — Progress Notes (Signed)
HPI:   [name] is a 32 y.o. female, who is here today to establish care with me.  Former PCP: N/A.  She follows annually with her gynecologist Last preventive routine visit: Gyn exam in12/2018.  Chronic medical problems: Childhood asthma, allergic rhinitis, vitamin D deficiency, scoliosis (surgery 1999), and migraines (once per year).  She exercises regularly and follows a healthy diet.  Concerns today: She has no particular concerns, she mentions that for about 6 years she has had hives every time she eats foods containing gluten.  No history of celiac disease. She has no noted abdominal pain, diarrhea, blood in the stool, nausea, vomiting, or arthralgias.   She is following with Dr. Tamala Julian because left finger injury, slammed finger in her car door. According to patient, dislocation of first MCP joint with no fracture or tendon rupture. Still having local edema and pain.   Review of Systems  Constitutional: Negative for activity change, appetite change, fatigue and fever.  HENT: Negative for mouth sores, nosebleeds and trouble swallowing.   Eyes: Negative for redness and visual disturbance.  Respiratory: Negative for shortness of breath and wheezing.   Cardiovascular: Negative for palpitations and leg swelling.  Gastrointestinal: Negative for abdominal pain, blood in stool, nausea and vomiting.       Negative for changes in bowel habits.  Endocrine: Negative for cold intolerance, heat intolerance, polydipsia, polyphagia and polyuria.  Genitourinary: Negative for decreased urine volume, difficulty urinating, dysuria and hematuria.  Musculoskeletal: Positive for arthralgias (MCP left index). Negative for gait problem.  Allergic/Immunologic: Positive for environmental allergies.  Neurological: Negative for syncope, weakness and headaches.     Current Outpatient Medications on File Prior to Visit  Medication Sig Dispense Refill  . cholecalciferol (VITAMIN D)  1000 units tablet Take 4,000 Units by mouth daily.    . fexofenadine (ALLEGRA) 30 MG tablet Take 30 mg by mouth daily.     . Norethindrone-Ethinyl Estradiol-Fe Biphas (LO LOESTRIN FE) 1 MG-10 MCG / 10 MCG tablet Take 1 tablet by mouth daily. 3 Package 4   No current facility-administered medications on file prior to visit.      Past Medical History:  Diagnosis Date  . Allergy   . Asthma   . Hx of migraines 01/012003   can tell when they come on, treats with excedrin migraine.  . Scoliosis 10/24/1995  . Vitamin D deficiency    Allergies  Allergen Reactions  . Prednisone     Tingling in hands and face    Family History  Problem Relation Age of Onset  . Breast cancer Maternal Grandmother   . Cancer Maternal Grandmother   . Hearing loss Father   . Prostate cancer Maternal Grandfather   . Cancer Maternal Grandfather   . Heart disease Maternal Grandfather   . Brain cancer Paternal Grandmother   . Cancer Paternal Grandmother   . Melanoma Paternal Grandfather   . Cancer Paternal Grandfather   . Colon cancer Paternal Aunt     Social History   Socioeconomic History  . Marital status: Married    Spouse name: Not on file  . Number of children: 0  . Years of education: Not on file  . Highest education level: Not on file  Occupational History  . Not on file  Social Needs  . Financial resource strain: Not on file  . Food insecurity:    Worry: Not on file    Inability: Not on file  . Transportation needs:  Medical: Not on file    Non-medical: Not on file  Tobacco Use  . Smoking status: Never Smoker  . Smokeless tobacco: Never Used  Substance and Sexual Activity  . Alcohol use: Not on file  . Drug use: No  . Sexual activity: Yes    Partners: Male    Birth control/protection: IUD    Comment: skyla  Lifestyle  . Physical activity:    Days per week: Not on file    Minutes per session: Not on file  . Stress: Not on file  Relationships  . Social connections:     Talks on phone: Not on file    Gets together: Not on file    Attends religious service: Not on file    Active member of club or organization: Not on file    Attends meetings of clubs or organizations: Not on file    Relationship status: Not on file  Other Topics Concern  . Not on file  Social History Narrative  . Not on file    Vitals:   02/04/18 1531  BP: 120/66  Pulse: 67  Resp: 12  Temp: 98.4 F (36.9 C)  SpO2: 98%    Body mass index is 23.21 kg/m.   Physical Exam  Nursing note and vitals reviewed. Constitutional: She is oriented to person, place, and time. She appears well-developed and well-nourished. No distress.  HENT:  Head: Normocephalic and atraumatic.  Mouth/Throat: Oropharynx is clear and moist and mucous membranes are normal.  Eyes: Pupils are equal, round, and reactive to light. Conjunctivae are normal.  Neck: No tracheal deviation present. No thyroid mass and no thyromegaly present.  Cardiovascular: Normal rate and regular rhythm.  No murmur heard. Pulses:      Dorsalis pedis pulses are 2+ on the right side, and 2+ on the left side.  Respiratory: Effort normal and breath sounds normal. No respiratory distress.  GI: Soft. She exhibits no mass. There is no hepatomegaly. There is no tenderness.  Musculoskeletal: She exhibits no edema.       Thoracic back: She exhibits deformity (scoliosis noted).  Lymphadenopathy:    She has no cervical adenopathy.  Neurological: She is alert and oriented to person, place, and time. She has normal strength. Coordination normal.  Skin: Skin is warm. No erythema.  Psychiatric: She has a normal mood and affect.  Well groomed, good eye contact.     ASSESSMENT AND PLAN:   [name] was seen today for establish care.  Diagnoses and all orders for this visit:  Non-celiac gluten sensitivity  Noted about 6 years ago. She does not  think immunology evaluation is necessary at this time, she has no problem avoiding gluten  in general. Follow-up as needed.  Healthcare maintenance  She is up-to-date with her Pap smear. Vaccines reported as up-to-date. Lipid panel done in 06/2017 in normal range. A1c 5.1 in 10/2017 and fasting glucose 79 in 10/2017.       G. Martinique, MD  [facility]. [place] office.

## 2018-02-05 ENCOUNTER — Encounter: Payer: Self-pay | Admitting: Family Medicine

## 2018-02-11 NOTE — Progress Notes (Signed)
[facility] [address] [place], [place] [address] Phone: [phone] Subjective:     CC: Finger pain follow-up  [id number]  Madison [name] is a 32 y.o. female following up for a L index finger volar plate injury.  Pt states that she feels like her L index finger is more painful since starting to wear her splint.  She rates the pain as a 4.5/10.  Pt states that she is wearing her splint 23 hours/day as directed.  She notes a "substantial bump" along the base of the L index finger between the index and middle finger.  She is alternating Aleve and IBU during the day.     Past Medical History:  Diagnosis Date  . Allergy   . Asthma   . Hx of migraines 01/012003   can tell when they come on, treats with excedrin migraine.  . Scoliosis 10/24/1995  . Vitamin D deficiency    Past Surgical History:  Procedure Laterality Date  . partial spinal fusion C7-T2 due to scoliosis 03/1998 N/A 04/11/1998  . TONSILLECTOMY     Social History   Socioeconomic History  . Marital status: Married    Spouse name: Not on file  . Number of children: 0  . Years of education: Not on file  . Highest education level: Not on file  Occupational History  . Not on file  Social Needs  . Financial resource strain: Not on file  . Food insecurity:    Worry: Not on file    Inability: Not on file  . Transportation needs:    Medical: Not on file    Non-medical: Not on file  Tobacco Use  . Smoking status: Never Smoker  . Smokeless tobacco: Never Used  Substance and Sexual Activity  . Alcohol use: Not on file  . Drug use: No  . Sexual activity: Yes    Partners: Male    Birth control/protection: IUD    Comment: skyla  Lifestyle  . Physical activity:    Days per week: Not on file    Minutes per session: Not on file  . Stress: Not on file  Relationships  . Social connections:    Talks on phone: Not on file    Gets together: Not on file    Attends religious  service: Not on file    Active member of club or organization: Not on file    Attends meetings of clubs or organizations: Not on file    Relationship status: Not on file  Other Topics Concern  . Not on file  Social History Narrative  . Not on file   Allergies  Allergen Reactions  . Prednisone     Tingling in hands and face   Family History  Problem Relation Age of Onset  . Breast cancer Maternal Grandmother   . Cancer Maternal Grandmother   . Hearing loss Father   . Prostate cancer Maternal Grandfather   . Cancer Maternal Grandfather   . Heart disease Maternal Grandfather   . Brain cancer Paternal Grandmother   . Cancer Paternal Grandmother   . Melanoma Paternal Grandfather   . Cancer Paternal Grandfather   . Colon cancer Paternal Aunt      Past medical history, social, surgical and family history all reviewed in electronic medical record.  No pertanent information unless stated regarding to the chief complaint.   Review of Systems:Review of systems updated and as accurate as of 02/12/18  No headache, visual changes,  nausea, vomiting, diarrhea, constipation, dizziness, abdominal pain, skin rash, fevers, chills, night sweats, weight loss, swollen lymph nodes, body aches, joint swelling, chest pain, shortness of breath, mood changes.  Positive muscle aches  Objective  Blood pressure 104/68, pulse 71, height 5' 3"  (1.6 m), weight 132 lb 9.6 oz (60.1 kg), SpO2 99 %. Systems examined below as of 02/12/18   General: No apparent distress alert and oriented x3 mood and affect normal, dressed appropriately.  HEENT: Pupils equal, extraocular movements intact  Respiratory: Patient's speak in full sentences and does not appear short of breath  Cardiovascular: No lower extremity edema, non tender, no erythema  Skin: Warm dry intact with no signs of infection or rash on extremities or on axial skeleton.  Abdomen: Soft nontender  Neuro: Cranial nerves II through XII are intact,  neurovascularly intact in all extremities with 2+ DTRs and 2+ pulses.  Lymph: No lymphadenopathy of posterior or anterior cervical chain or axillae bilaterally.  Gait normal with good balance and coordination.  MSK:  Non tender with full range of motion and good stability and symmetric strength and tone of shoulders, elbows, wrist, hip, knee and ankles bilaterally.  Patient's left hand and index finger shows that she still has some mild abnormality noted.  Patient still tender to palpation over the volar plate but significantly less than previous exam.  Full range of motion noted.  Mild pain over the radial aspect near the PIP joint.  Limited musculoskeletal ultrasound was performed and interpreted by Lyndal Pulley  Limited ultrasound of patient's index finger shows that the volar plate is completely healed at this time.  No displacement noted.  Overlying sesamoid bone seems to be doing well. An area where discomfort is on the radial aspect of the PIP joint there is no significant bony abnormality or overlying cyst formation. Impression: Interval healing of volar plate fracture    Impression and Recommendations:     This case required medical decision making of moderate complexity.      Note: This dictation was prepared with Dragon dictation along with smaller phrase technology. Any transcriptional errors that result from this process are unintentional.

## 2018-02-12 ENCOUNTER — Ambulatory Visit: Payer: 59 | Admitting: Family Medicine

## 2018-02-12 ENCOUNTER — Ambulatory Visit: Payer: Self-pay

## 2018-02-12 ENCOUNTER — Encounter: Payer: Self-pay | Admitting: Family Medicine

## 2018-02-12 VITALS — BP 104/68 | HR 71 | Ht 63.0 in | Wt 132.6 lb

## 2018-02-12 DIAGNOSIS — M79645 Pain in left finger(s): Secondary | ICD-10-CM | POA: Diagnosis not present

## 2018-02-12 DIAGNOSIS — S63639A Sprain of interphalangeal joint of unspecified finger, initial encounter: Secondary | ICD-10-CM | POA: Diagnosis not present

## 2018-02-12 NOTE — Patient Instructions (Signed)
Good to see you  Ice may help for now  Buddy tape the finger for 2 weeks Maybe brace at night if you need it Continue the vitamin D See me again in 3-4 weeks if not 90% or better

## 2018-02-12 NOTE — Assessment & Plan Note (Signed)
Patient does have noted good callus formation at this point.  Patient still has an abnormality noted on the radial side of the index finger but on ultrasound no significant findings noted.  No significant bony defects noted at this time.  Patient will do more of a buddy taping and avoid the splinting at this time.  We discussed icing regimen and home exercises.  Discussed which activities to do which wants to avoid.  Patient will see me again in 3-4 weeks.

## 2018-02-27 ENCOUNTER — Telehealth: Payer: 59 | Admitting: Nurse Practitioner

## 2018-02-27 DIAGNOSIS — J0100 Acute maxillary sinusitis, unspecified: Secondary | ICD-10-CM

## 2018-02-27 MED ORDER — AMOXICILLIN-POT CLAVULANATE 875-125 MG PO TABS: 1.0000 | ORAL_TABLET | Freq: Two times a day (BID) | ORAL | 0 refills | Status: DC

## 2018-02-27 MED FILL — AMOX TR-K CLV 875-125 MG TA: 875-125 | 7 days supply | Qty: 14 | Fill #0

## 2018-02-27 NOTE — Progress Notes (Signed)
I spoke with patient to verify that she has had a low grade fever for more than 3 weeks.  We are sorry that you are not feeling well.  Here is how we plan to help!  Based on what you have shared with me it looks like you have sinusitis.  Sinusitis is inflammation and infection in the sinus cavities of the head.  Based on your presentation I believe you most likely have Acute Bacterial Sinusitis.  This is an infection caused by bacteria and is treated with antibiotics. I have prescribed Augmentin 877m/125mg one tablet twice daily with food, for 7 days. You may use an oral decongestant such as Mucinex D or if you have glaucoma or high blood pressure use plain Mucinex. Saline nasal spray help and can safely be used as often as needed for congestion.  If you develop worsening sinus pain, fever or notice severe headache and vision changes, or if symptoms are not better after completion of antibiotic, please schedule an appointment with a health care provider.    Sinus infections are not as easily transmitted as other respiratory infection, however we still recommend that you avoid close contact with loved ones, especially the very young and elderly.  Remember to wash your hands thoroughly throughout the day as this is the number one way to prevent the spread of infection!  Home Care:  Only take medications as instructed by your medical team.  Complete the entire course of an antibiotic.  Do not take these medications with alcohol.  A steam or ultrasonic humidifier can help congestion.  You can place a towel over your head and breathe in the steam from hot water coming from a faucet.  Avoid close contacts especially the very young and the elderly.  Cover your mouth when you cough or sneeze.  Always remember to wash your hands.  Get Help Right Away If:  You develop worsening fever or sinus pain.  You develop a severe head ache or visual changes.  Your symptoms persist after you have completed  your treatment plan.  Make sure you  Understand these instructions.  Will watch your condition.  Will get help right away if you are not doing well or get worse.  Your e-visit answers were reviewed by a board certified advanced clinical practitioner to complete your personal care plan.  Depending on the condition, your plan could have included both over the counter or prescription medications.  If there is a problem please reply  once you have received a response from your provider.  Your safety is important to [place]  If you have drug allergies check your prescription carefully.    You can use MyChart to ask questions about today's visit, request a non-urgent call back, or ask for a work or school excuse for 24 hours related to this e-Visit. If it has been greater than 24 hours you will need to follow up with your provider, or enter a new e-Visit to address those concerns.  You will get an e-mail in the next two days asking about your experience.  I hope that your e-visit has been valuable and will speed your recovery. Thank you for using e-visits.

## 2018-03-08 ENCOUNTER — Ambulatory Visit: Payer: Self-pay | Admitting: Nurse Practitioner

## 2018-03-11 ENCOUNTER — Telehealth: Payer: 59 | Admitting: Nurse Practitioner

## 2018-03-11 DIAGNOSIS — J029 Acute pharyngitis, unspecified: Secondary | ICD-10-CM

## 2018-03-11 NOTE — Progress Notes (Signed)
We are sorry that you are not feeling well.  Here is how we plan to help!  Based on what you have shared with me it looks like you have a condition called pharyngitis.  Pharyngitis is inflammation in the back of the throat which can cause a sore throat, scratchiness and sometimes difficulty swallowing.   Pharyngitis is typically caused by a respiratory virus and will just run its course.  Please keep in mind that your symptoms could last up to 10 days.  For throat pain, we recommend over the counter oral pain relief medications such as acetaminophen or aspirin, or anti-inflammatory medications such as ibuprofen or naproxen sodium.  Topical treatments such as oral throat lozenges or sprays may be used as needed.  Avoid close contact with loved ones, especially the very young and elderly.  Remember to wash your hands thoroughly throughout the day as this is the number one way tot prevent the spread of infection and wipe down door knobs and counters with disinfectant.  After careful review of your answers, I would not recommend and antibiotic for your condition.  Antibiotics should not be used to treat conditions that we suspect are caused by viruses like the virus that causes the common cold or flu.  Providers prescribe antibiotics to treat infections caused by bacteria. Antibiotics are very powerful in treating bacterial infections when they are used properly.  To maintain their effectiveness, they should be used only when necessary.  Overuse of antibiotics has resulted in the development of super bugs that are resistant to treatment!    Home Care:  Only take medications as instructed by your medical team.  Do not drink alcohol while taking these medications.  A steam or ultrasonic humidifier can help congestion.  You can place a towel over your head and breathe in the steam from hot water coming from a faucet.  Avoid close contacts especially the very young and the elderly.  Cover your mouth when  you cough or sneeze.  Always remember to wash your hands.  Get Help Right Away If:  You develop worsening fever or throat pain.  You develop a severe head ache or visual changes.  Your symptoms persist after you have completed your treatment plan.  Make sure you  Understand these instructions.  Will watch your condition.  Will get help right away if you are not doing well or get worse.  Your e-visit answers were reviewed by a board certified advanced clinical practitioner to complete your personal care plan.  Depending on the condition, your plan could have included both over the counter or prescription medications.  If there is a problem please reply  once you have received a response from your provider.  Your safety is important to [place].  If you have drug allergies check your prescription carefully.    You can use MyChart to ask questions about todays visit, request a non-urgent call back, or ask for a work or school excuse for 24 hours related to this e-Visit. If it has been greater than 24 hours you will need to follow up with your provider, or enter a new e-Visit to address those concerns.  You will get an e-mail in the next two days asking about your experience.  I hope that your e-visit has been valuable and will speed your recovery. Thank you for using e-visits.

## 2018-03-13 ENCOUNTER — Encounter: Payer: Self-pay | Admitting: Nurse Practitioner

## 2018-03-13 ENCOUNTER — Ambulatory Visit (INDEPENDENT_AMBULATORY_CARE_PROVIDER_SITE_OTHER): Payer: Self-pay | Admitting: Nurse Practitioner

## 2018-03-13 ENCOUNTER — Ambulatory Visit: Payer: 59 | Admitting: Family Medicine

## 2018-03-13 VITALS — BP 86/60 | HR 85 | Temp 98.4°F | Wt 132.2 lb

## 2018-03-13 DIAGNOSIS — J0100 Acute maxillary sinusitis, unspecified: Secondary | ICD-10-CM

## 2018-03-13 MED ORDER — AMOXICILLIN-POT CLAVULANATE ER 1000-62.5 MG PO TB12: 2.0000 | ORAL_TABLET | Freq: Two times a day (BID) | ORAL | 0 refills | Status: AC

## 2018-03-13 MED ORDER — FLUCONAZOLE 150 MG PO TABS: 150.0000 mg | ORAL_TABLET | Freq: Once | ORAL | 0 refills | Status: AC

## 2018-03-13 MED FILL — AMOXICILLIN-CLAV ER 1,000-6: 1000-62.5 | 10 days supply | Qty: 40 | Fill #0

## 2018-03-13 MED FILL — FLUCONAZOLE 150 MG TABS: 150 | 7 days supply | Qty: 3 | Fill #0

## 2018-03-13 NOTE — Patient Instructions (Signed)

## 2018-03-13 NOTE — Progress Notes (Signed)
Subjective:  The patient is a 32 year old female who presents for complaints of sinus pressure.  The patient states she has pain on the right side of her face, her ear, and in her jaw.  The patient also complains of a headache, that she describes as throbbing.  The patient does have a history of seasonal allergies, and states that she has completed a series of allergy shots in the past.  The patient was also recently treated for sinus infection approximately 1 week prior.  The patient states she was treated with Augmentin, completed the course, and about 3 days after finishing the medication and her symptoms returned.     The following portions of the patient's history were reviewed and updated as appropriate:  allergies, current medications and past medical history.  Constitutional: positive for chills and fatigue, negative for anorexia, malaise and sweats Eyes: negative Ears, nose, mouth, throat, and face: positive for nasal congestion, sore throat and bilateral ear pain/pressure, negative for ear drainage and hoarseness Respiratory: positive for cough, sputum and wheezing, negative for asthma, chronic bronchitis, dyspnea on exertion, pneumonia and stridor Cardiovascular: negative Gastrointestinal: negative Neurological: positive for headaches, negative for coordination problems, dizziness, paresthesia, speech problems, tremors, vertigo and weakness Allergic/Immunologic: positive for hay fever Objective:  BP (!) 86/60   Pulse 85   Temp 98.4 F (36.9 C)   Wt 132 lb 3.2 oz (60 kg)   SpO2 97%   BMI 23.42 kg/m  General appearance: alert, cooperative, fatigued and no distress Head: Normocephalic, without obvious abnormality, atraumatic Eyes: conjunctivae/corneas clear. PERRL, EOM's intact. Fundi benign. Ears: normal TM's and external ear canals both ears Nose: no discharge, turbinates swollen, inflamed, moderate maxillary sinus tenderness bilateral, no frontal sinus tenderness  bilateral Throat: lips, mucosa, and tongue normal; teeth and gums normal Lungs: clear to auscultation bilaterally Heart: regular rate and rhythm, S1, S2 normal, no murmur, click, rub or gallop Abdomen: soft, non-tender; bowel sounds normal; no masses,  no organomegaly Pulses: 2+ and symmetric Skin: Skin color, texture, turgor normal. No rashes or lesions Lymph nodes: cervical and submandibular nodes normal Neurologic: Grossly normal    Assessment:  Acute Maxillary Sinusitis    Plan:  Discussed diagnosis and treatment of sinusitis. Educational material distributed and questions answered. Suggested symptomatic OTC remedies. Supportive care with appropriate antipyretics and fluids. Nasal saline spray for congestion. Augmentin per orders. Follow up as needed.  Discussed with patient follow-up if this treatment regimen does not work for her.  Patient informed that she will need to follow-up with PCP for possible imaging of her sinuses if symptoms do not improve.  Patient verbalizes understanding and has no questions at time of discharge.  Also recheck patient's blood pressure at time of discharge.  Patient's BP was 90/54.  Patient denies any symptoms such as lightheadedness or dizziness, but does admit to fatigue.  Patient informed to inform her coworkers if she becomes symptomatic. Meds ordered this encounter  Medications  . amoxicillin-clavulanate (AUGMENTIN XR) 1000-62.5 MG 12 hr tablet    Sig: Take 2 tablets by mouth 2 (two) times daily for 10 days.    Dispense:  40 tablet    Refill:  0    Order Specific Question:   Supervising Provider    Answer:   Ricard Dillon [[id number]]  . fluconazole (DIFLUCAN) 150 MG tablet    Sig: Take 1 tablet (150 mg total) by mouth once for 1 dose. May repeat 2nd tablet in 3 days, if needed may repeat 3rd tablet  in 3 more days.    Dispense:  3 tablet    Refill:  0    Order Specific Question:   Supervising Provider    Answer:   Ricard Dillon [phone]

## 2018-03-15 ENCOUNTER — Telehealth: Payer: Self-pay

## 2018-03-15 NOTE — Telephone Encounter (Signed)
Pt returned call and states she is feeling better.

## 2018-04-08 MED FILL — LO LOESTRIN FE 1-10 TABLET: 1 MG-10 MCG | 84 days supply | Qty: 84 | Fill #1

## 2018-05-01 DIAGNOSIS — R102 Pelvic and perineal pain: Secondary | ICD-10-CM | POA: Diagnosis not present

## 2018-05-01 DIAGNOSIS — Z30011 Encounter for initial prescription of contraceptive pills: Secondary | ICD-10-CM | POA: Diagnosis not present

## 2018-05-01 DIAGNOSIS — B977 Papillomavirus as the cause of diseases classified elsewhere: Secondary | ICD-10-CM | POA: Diagnosis not present

## 2018-05-10 MED FILL — NORETHIND-ETH ESTRAD 1-0.02: 1-20 | 84 days supply | Qty: 63 | Fill #0

## 2018-05-31 ENCOUNTER — Telehealth: Payer: 59 | Admitting: Family

## 2018-05-31 DIAGNOSIS — B9689 Other specified bacterial agents as the cause of diseases classified elsewhere: Secondary | ICD-10-CM

## 2018-05-31 DIAGNOSIS — J028 Acute pharyngitis due to other specified organisms: Secondary | ICD-10-CM | POA: Diagnosis not present

## 2018-05-31 MED ORDER — PREDNISONE 5 MG PO TABS: 5.0000 mg | ORAL_TABLET | Freq: Every day | ORAL | 0 refills | Status: DC

## 2018-05-31 MED ORDER — BENZONATATE 100 MG PO CAPS: 100.0000 mg | ORAL_CAPSULE | Freq: Three times a day (TID) | ORAL | 0 refills | Status: DC | PRN

## 2018-05-31 MED ORDER — ALBUTEROL SULFATE HFA 108 (90 BASE) MCG/ACT IN AERS: 2.0000 | INHALATION_SPRAY | RESPIRATORY_TRACT | 2 refills | Status: DC | PRN

## 2018-05-31 MED ORDER — AZITHROMYCIN 250 MG PO TABS: ORAL_TABLET | ORAL | 0 refills | Status: DC

## 2018-05-31 MED FILL — BENZONATATE 100 MG CAP: 100 | 5 days supply | Qty: 30 | Fill #0

## 2018-05-31 MED FILL — AZITHROMYCIN 250 MG TABS: 250 | 5 days supply | Qty: 6 | Fill #0

## 2018-05-31 NOTE — Progress Notes (Signed)
Thank you for the details you included in the comment boxes. Those details are very helpful in determining the best course of treatment for you and help [place] to provide the best care.  We are sorry that you are not feeling well.  Here is how we plan to help!  Based on your presentation I believe you most likely have A cough due to bacteria.  When patients have a fever and a productive cough with a change in color or increased sputum production, we are concerned about bacterial bronchitis.  If left untreated it can progress to pneumonia.  If your symptoms do not improve with your treatment plan it is important that you contact your provider.   I have prescribed Azithromyin 250 mg: two tablets now and then one tablet daily for 4 additonal days    In addition you may use A non-prescription cough medication called Mucinex DM: take 2 tablets every 12 hours. and A prescription cough medication called Tessalon Perles 162m. You may take 1-2 capsules every 8 hours as needed for your cough.  Prednisone 5 mg daily for 6 days (see taper instructions below)  Directions for 6 day taper: Day 1: 2 tablets before breakfast, 1 after both lunch & dinner and 2 at bedtime Day 2: 1 tab before breakfast, 1 after both lunch & dinner and 2 at bedtime Day 3: 1 tab at each meal & 1 at bedtime Day 4: 1 tab at breakfast, 1 at lunch, 1 at bedtime Day 5: 1 tab at breakfast & 1 tab at bedtime Day 6: 1 tab at breakfast   From your responses in the eVisit questionnaire you describe inflammation in the upper respiratory tract which is causing a significant cough.  This is commonly called Bronchitis and has four common causes:    Allergies  Viral Infections  Acid Reflux  Bacterial Infection Allergies, viruses and acid reflux are treated by controlling symptoms or eliminating the cause. An example might be a cough caused by taking certain blood pressure medications. You stop the cough by changing the medication. Another  example might be a cough caused by acid reflux. Controlling the reflux helps control the cough.  USE OF BRONCHODILATOR ("RESCUE") INHALERS: There is a risk from using your bronchodilator too frequently.  The risk is that over-reliance on a medication which only relaxes the muscles surrounding the breathing tubes can reduce the effectiveness of medications prescribed to reduce swelling and congestion of the tubes themselves.  Although you feel brief relief from the bronchodilator inhaler, your asthma may actually be worsening with the tubes becoming more swollen and filled with mucus.  This can delay other crucial treatments, such as oral steroid medications. If you need to use a bronchodilator inhaler daily, several times per day, you should discuss this with your provider.  There are probably better treatments that could be used to keep your asthma under control.     HOME CARE . Only take medications as instructed by your medical team. . Complete the entire course of an antibiotic. . Drink plenty of fluids and get plenty of rest. . Avoid close contacts especially the very young and the elderly . Cover your mouth if you cough or cough into your sleeve. . Always remember to wash your hands . A steam or ultrasonic humidifier can help congestion.   GET HELP RIGHT AWAY IF: . You develop worsening fever. . You become short of breath . You cough up blood. . Your symptoms persist after you have  completed your treatment plan MAKE SURE YOU   Understand these instructions.  Will watch your condition.  Will get help right away if you are not doing well or get worse.  Your e-visit answers were reviewed by a board certified advanced clinical practitioner to complete your personal care plan.  Depending on the condition, your plan could have included both over the counter or prescription medications. If there is a problem please reply  once you have received a response from your provider. Your safety  is important to [place].  If you have drug allergies check your prescription carefully.    You can use MyChart to ask questions about today's visit, request a non-urgent call back, or ask for a work or school excuse for 24 hours related to this e-Visit. If it has been greater than 24 hours you will need to follow up with your provider, or enter a new e-Visit to address those concerns. You will get an e-mail in the next two days asking about your experience.  I hope that your e-visit has been valuable and will speed your recovery. Thank you for using e-visits.

## 2018-05-31 NOTE — Addendum Note (Signed)
Addended by: Benjamine Mola on: 05/31/2018 09:58 AM   Modules accepted: Orders

## 2018-07-09 MED FILL — LO LOESTRIN FE 1-10 TABLET: 1 MG-10 MCG | 28 days supply | Qty: 28 | Fill #0

## 2018-08-02 MED FILL — LO LOESTRIN FE 1-10 TABLET: 1 MG-10 MCG | 84 days supply | Qty: 84 | Fill #1

## 2018-10-07 DIAGNOSIS — H5203 Hypermetropia, bilateral: Secondary | ICD-10-CM | POA: Diagnosis not present

## 2018-10-25 MED FILL — LO LOESTRIN FE 1-10 TABLET: 1 MG-10 MCG | 28 days supply | Qty: 28 | Fill #2

## 2018-11-06 NOTE — Progress Notes (Signed)
[name] [address] [place], [place] [address] Phone: [phone] Subjective:      CC: Low back pain  OBS:[id number]  Madison [name] is a 33 y.o. female coming in with complaint of left hand, 4th finger volar plate injury. No pain since last visit.   Is having right sided lower back pain since November. Pain increased last week. Does have history of scoliosis. Pain radiates to lateral right hip. Standing improves pain where as sitting increases pain. Does exercise but has not been back to physical activity due to pain. Alternates Tylenol and Advil daily.       Past Medical History:  Diagnosis Date  . Allergy   . Asthma   . Hx of migraines 01/012003   can tell when they come on, treats with excedrin migraine.  . Scoliosis 10/24/1995  . Vitamin D deficiency    Past Surgical History:  Procedure Laterality Date  . partial spinal fusion C7-T2 due to scoliosis 03/1998 N/A 04/11/1998  . TONSILLECTOMY     Social History   Socioeconomic History  . Marital status: Married    Spouse name: Not on file  . Number of children: 0  . Years of education: Not on file  . Highest education level: Not on file  Occupational History  . Not on file  Social Needs  . Financial resource strain: Not on file  . Food insecurity:    Worry: Not on file    Inability: Not on file  . Transportation needs:    Medical: Not on file    Non-medical: Not on file  Tobacco Use  . Smoking status: Never Smoker  . Smokeless tobacco: Never Used  Substance and Sexual Activity  . Alcohol use: Not on file  . Drug use: No  . Sexual activity: Yes    Partners: Male    Birth control/protection: I.U.D.    Comment: skyla  Lifestyle  . Physical activity:    Days per week: Not on file    Minutes per session: Not on file  . Stress: Not on file  Relationships  . Social connections:    Talks on phone: Not on file    Gets together: Not on file    Attends religious service:  Not on file    Active member of club or organization: Not on file    Attends meetings of clubs or organizations: Not on file    Relationship status: Not on file  Other Topics Concern  . Not on file  Social History Narrative  . Not on file   Allergies  Allergen Reactions  . Prednisone     Tingling in hands and face   Family History  Problem Relation Age of Onset  . Breast cancer Maternal Grandmother   . Cancer Maternal Grandmother   . Hearing loss Father   . Prostate cancer Maternal Grandfather   . Cancer Maternal Grandfather   . Heart disease Maternal Grandfather   . Brain cancer Paternal Grandmother   . Cancer Paternal Grandmother   . Melanoma Paternal Grandfather   . Cancer Paternal Grandfather   . Colon cancer Paternal Aunt     Current Outpatient Medications (Endocrine & Metabolic):  Marland Kitchen  Norethindrone-Ethinyl Estradiol-Fe Biphas (LO LOESTRIN FE) 1 MG-10 MCG / 10 MCG tablet, Take 1 tablet by mouth daily.   Current Outpatient Medications (Respiratory):  .  fexofenadine (ALLEGRA) 30 MG tablet, Take 30 mg by mouth daily.   Current Outpatient Medications (Analgesics):  .  meloxicam (MOBIC) 15 MG tablet, Take 1 tablet (15 mg total) by mouth daily.      Past medical history, social, surgical and family history all reviewed in electronic medical record.  No pertanent information unless stated regarding to the chief complaint.   Review of Systems:  No headache, visual changes, nausea, vomiting, diarrhea, constipation, dizziness, abdominal pain, skin rash, fevers, chills, night sweats, weight loss, swollen lymph nodes, body aches, joint swelling, muscle aches, chest pain, shortness of breath, mood changes.   Objective  Blood pressure 102/72, pulse 75, height 5' 3"  (1.6 m), last menstrual period 11/07/2018, SpO2 99 %.   General: No apparent distress alert and oriented x3 mood and affect normal, dressed appropriately.  HEENT: Pupils equal, extraocular movements intact    Respiratory: Patient's speak in full sentences and does not appear short of breath  Cardiovascular: No lower extremity edema, non tender, no erythema  Skin: Warm dry intact with no signs of infection or rash on extremities or on axial skeleton.  Abdomen: Soft nontender  Neuro: Cranial nerves II through XII are intact, neurovascularly intact in all extremities with 2+ DTRs and 2+ pulses.  Lymph: No lymphadenopathy of posterior or anterior cervical chain or axillae bilaterally.  Gait normal with good balance and coordination.  MSK:  Non tender with full range of motion and good stability and symmetric strength and tone of shoulders, elbows, wrist, hip, knee and ankles bilaterally.  Back Exam:  Inspection: Severe scoliosis.  Harrington rod and placed in the thoracic spine Motion: Flexion 35 deg, Extension 45 deg, Side Bending to 45 deg bilaterally,  Rotation to 45 deg bilaterally  SLR laying: Negative  XSLR laying: Negative  Palpable tenderness: Nontender to palpation in the paraspinal musculature lumbar spine right greater than left.  Significant scoliosis noted FABER: Tightness bilaterally. Sensory change: Gross sensation intact to all lumbar and sacral dermatomes.  Reflexes: 2+ at both patellar tendons, 2+ at achilles tendons, Babinski's downgoing.  Strength at foot  Plantar-flexion: 5/5 Dorsi-flexion: 5/5 Eversion: 5/5 Inversion: 5/5  Leg strength  Quad: 5/5 Hamstring: 5/5 Hip flexor: 5/5 Hip abductors: 5/5  Gait unremarkable.  Osteopathic findings  T7 extended rotated and side bent left L1-L4 neutral, side bent right rotated left Sacrum right on right  [id number]; 15 additional minutes spent for Therapeutic exercises as stated in above notes.  This included exercises focusing on stretching, strengthening, with significant focus on eccentric aspects.   Long term goals include an improvement in range of motion, strength, endurance as well as avoiding reinjury. Patient's frequency would  include in 1-2 times a day, 3-5 times a week for a duration of 6-12 weeks.Low back exercises that included:  Pelvic tilt/bracing instruction to focus on control of the pelvic girdle and lower abdominal muscles  Glute strengthening exercises, focusing on proper firing of the glutes without engaging the low back muscles Proper stretching techniques for maximum relief for the hamstrings, hip flexors, low back and some rotation where tolerated    Proper technique shown and discussed handout in great detail with ATC.  All questions were discussed and answered.      Impression and Recommendations:     This case required medical decision making of moderate complexity. The above documentation has been reviewed and is accurate and complete Lyndal Pulley, DO       Note: This dictation was prepared with Dragon dictation along with smaller phrase technology. Any transcriptional errors that result from this process are unintentional.

## 2018-11-07 ENCOUNTER — Encounter: Payer: Self-pay | Admitting: Family Medicine

## 2018-11-07 ENCOUNTER — Ambulatory Visit (INDEPENDENT_AMBULATORY_CARE_PROVIDER_SITE_OTHER)
Admission: RE | Admit: 2018-11-07 | Discharge: 2018-11-07 | Disposition: A | Discharge status: Home / Self Care | Payer: 59 | Source: Ambulatory Visit | Attending: Family Medicine | Admitting: Family Medicine

## 2018-11-07 ENCOUNTER — Ambulatory Visit (INDEPENDENT_AMBULATORY_CARE_PROVIDER_SITE_OTHER): Payer: 59 | Admitting: Family Medicine

## 2018-11-07 VITALS — BP 102/72 | HR 75 | Ht 63.0 in

## 2018-11-07 DIAGNOSIS — M999 Biomechanical lesion, unspecified: Secondary | ICD-10-CM | POA: Diagnosis not present

## 2018-11-07 DIAGNOSIS — M545 Low back pain, unspecified: Secondary | ICD-10-CM

## 2018-11-07 DIAGNOSIS — M41125 Adolescent idiopathic scoliosis, thoracolumbar region: Secondary | ICD-10-CM

## 2018-11-07 MED ORDER — MELOXICAM 15 MG PO TABS: 15.0000 mg | ORAL_TABLET | Freq: Every day | ORAL | 0 refills | Status: DC

## 2018-11-07 MED FILL — MELOXICAM 15 MG TABLET: 15 | 30 days supply | Qty: 30 | Fill #0

## 2018-11-07 NOTE — Patient Instructions (Signed)
Goo to see you  Madison [name] is your friend Ice 20 minutes 2 times daily. Usually after activity and before bed. Stay active but avoid extension of the back  Xray downstairs Melioxcam daily instead of ibuprofen  See me again in 3 weeks

## 2018-11-07 NOTE — Assessment & Plan Note (Signed)
Decision today to treat with OMT was based on Physical Exam  After verbal consent patient was treated with HVLA, ME, FPR techniques in  thoracic, lumbar and sacral areas  Patient tolerated the procedure well with improvement in symptoms  Patient given exercises, stretches and lifestyle modifications  See medications in patient instructions if given  Patient will follow up in 4-6 weeks 

## 2018-11-07 NOTE — Assessment & Plan Note (Signed)
Status post Harrington rod on multiple years ago.  Concern for degenerative disc disease in the lumbar spine.  Patient is having worsening pain.  Likely no significant radiculopathy.  X-rays ordered today.  Attempted osteopathic manipulation with mild low velocity techniques.  Encouraged home exercises, icing regimen, patient was adamant against any type of prescription medications other than meloxicam which was prescribed today.  Did not want to try gabapentin.  Follow-up with me again in 4 to 6 weeks

## 2018-11-08 ENCOUNTER — Encounter: Payer: Self-pay | Admitting: Family Medicine

## 2018-11-22 ENCOUNTER — Encounter: Payer: Self-pay | Admitting: Family Medicine

## 2018-11-22 MED FILL — LO LOESTRIN FE 1-10 TABLET: 1 MG-10 MCG | 84 days supply | Qty: 84 | Fill #0

## 2018-11-28 ENCOUNTER — Encounter: Payer: Self-pay | Admitting: Family Medicine

## 2018-11-28 ENCOUNTER — Ambulatory Visit (INDEPENDENT_AMBULATORY_CARE_PROVIDER_SITE_OTHER): Payer: 59 | Admitting: Family Medicine

## 2018-11-28 VITALS — BP 106/74 | HR 78 | Ht 63.0 in | Wt 134.0 lb

## 2018-11-28 DIAGNOSIS — M999 Biomechanical lesion, unspecified: Secondary | ICD-10-CM | POA: Diagnosis not present

## 2018-11-28 DIAGNOSIS — M41125 Adolescent idiopathic scoliosis, thoracolumbar region: Secondary | ICD-10-CM

## 2018-11-28 NOTE — Assessment & Plan Note (Signed)
Severe overall.  I think patient will continue to have some difficulty.  The patient does have some degenerative disc disease at multiple levels.  No new though patient has responded fairly well to osteopathic manipulation.  Encourage patient to continue to work on posture and ergonomics when possible.  Patient is responding well to manipulation and follow-up again in 6 weeks

## 2018-11-28 NOTE — Patient Instructions (Signed)
Good to see you  Prilosec daily for 2 weeks Keep it up  See me again in 4 weeks

## 2018-11-28 NOTE — Progress Notes (Signed)
[name] [address] [place], [place] [address] Phone: [phone] Subjective:    I Madison [name] am serving as a Education administrator for Dr. Hulan Saas.    CC: Back pain follow-up  LFY:[id number]  Madison [name] is a 33 y.o. female coming in with complaint of back pain. States she is better but the pain is not gone.  Patient feels that the manipulation was helpful.  Still has just a chronic pain from her scoliosis.  Patient denies any radiation pain.  Denies anything that stopping her from activity.  Try to be more active and work out on a more regular basis.       Past Medical History:  Diagnosis Date  . Allergy   . Asthma   . Hx of migraines 01/012003   can tell when they come on, treats with excedrin migraine.  . Scoliosis 10/24/1995  . Vitamin D deficiency    Past Surgical History:  Procedure Laterality Date  . partial spinal fusion C7-T2 due to scoliosis 03/1998 N/A 04/11/1998  . TONSILLECTOMY     Social History   Socioeconomic History  . Marital status: Married    Spouse name: Not on file  . Number of children: 0  . Years of education: Not on file  . Highest education level: Not on file  Occupational History  . Not on file  Social Needs  . Financial resource strain: Not on file  . Food insecurity:    Worry: Not on file    Inability: Not on file  . Transportation needs:    Medical: Not on file    Non-medical: Not on file  Tobacco Use  . Smoking status: Never Smoker  . Smokeless tobacco: Never Used  Substance and Sexual Activity  . Alcohol use: Not on file  . Drug use: No  . Sexual activity: Yes    Partners: Male    Birth control/protection: I.U.D.    Comment: skyla  Lifestyle  . Physical activity:    Days per week: Not on file    Minutes per session: Not on file  . Stress: Not on file  Relationships  . Social connections:    Talks on phone: Not on file    Gets together: Not on file    Attends religious service:  Not on file    Active member of club or organization: Not on file    Attends meetings of clubs or organizations: Not on file    Relationship status: Not on file  Other Topics Concern  . Not on file  Social History Narrative  . Not on file   Allergies  Allergen Reactions  . Prednisone     Tingling in hands and face   Family History  Problem Relation Age of Onset  . Breast cancer Maternal Grandmother   . Cancer Maternal Grandmother   . Hearing loss Father   . Prostate cancer Maternal Grandfather   . Cancer Maternal Grandfather   . Heart disease Maternal Grandfather   . Brain cancer Paternal Grandmother   . Cancer Paternal Grandmother   . Melanoma Paternal Grandfather   . Cancer Paternal Grandfather   . Colon cancer Paternal Aunt     Current Outpatient Medications (Endocrine & Metabolic):  Marland Kitchen  Norethindrone-Ethinyl Estradiol-Fe Biphas (LO LOESTRIN FE) 1 MG-10 MCG / 10 MCG tablet, Take 1 tablet by mouth daily.   Current Outpatient Medications (Respiratory):  .  fexofenadine (ALLEGRA) 30 MG tablet, Take 30 mg by mouth  daily.   Current Outpatient Medications (Analgesics):  .  meloxicam (MOBIC) 15 MG tablet, Take 1 tablet (15 mg total) by mouth daily.      Past medical history, social, surgical and family history all reviewed in electronic medical record.  No pertanent information unless stated regarding to the chief complaint.   Review of Systems:  No headache, visual changes, nausea, vomiting, diarrhea, constipation, dizziness, abdominal pain, skin rash, fevers, chills, night sweats, weight loss, swollen lymph nodes, body aches, joint swelling, , chest pain, shortness of breath, mood changes.  Positive muscle aches  Objective  Blood pressure 106/74, pulse 78, height 5' 3"  (1.6 m), weight 134 lb (60.8 kg), last menstrual period 11/07/2018, SpO2 96 %.   General: No apparent distress alert and oriented x3 mood and affect normal, dressed appropriately.  HEENT: Pupils equal,  extraocular movements intact  Respiratory: Patient's speak in full sentences and does not appear short of breath  Cardiovascular: No lower extremity edema, non tender, no erythema  Skin: Warm dry intact with no signs of infection or rash on extremities or on axial skeleton.  Abdomen: Soft nontender  Neuro: Cranial nerves II through XII are intact, neurovascularly intact in all extremities with 2+ DTRs and 2+ pulses.  Lymph: No lymphadenopathy of posterior or anterior cervical chain or axillae bilaterally.  Gait mild antalgic MSK:  Non tender with full range of motion and good stability and symmetric strength and tone of shoulders, elbows, wrist, hip, knee and ankles bilaterally.  Back exam shows significant scoliosis noted with some primary as well as secondary curve.  Patient does have a anatomical and physiologic short leg noted causing antalgic gait.  Can run previous surgery noted.  Patient does have good sidebending as well as extension but does have some limited flexion of 35 degrees.  Tightness of the [facility] test bilaterally but negative straight leg test  Osteopathic findings T7 extended rotated and side bent left L1-L4 from neutral side bent right rotated left Sacrum right on right    Impression and Recommendations:     This case required medical decision making of moderate complexity. The above documentation has been reviewed and is accurate and complete Lyndal Pulley, DO       Note: This dictation was prepared with Dragon dictation along with smaller phrase technology. Any transcriptional errors that result from this process are unintentional.

## 2018-11-28 NOTE — Assessment & Plan Note (Addendum)
Decision today to treat with OMT was based on Physical Exam  After verbal consent patient was treated with HVLA, ME, FPR techniques in , thoracic, lumbar and sacral areas  Patient tolerated the procedure well with improvement in symptoms  Patient given exercises, stretches and lifestyle modifications  See medications in patient instructions if given  Patient will follow up in 4-8 weeks

## 2018-12-03 ENCOUNTER — Encounter: Payer: Self-pay | Admitting: Family Medicine

## 2018-12-07 ENCOUNTER — Ambulatory Visit (INDEPENDENT_AMBULATORY_CARE_PROVIDER_SITE_OTHER): Payer: Self-pay | Admitting: Nurse Practitioner

## 2018-12-07 VITALS — BP 116/68 | HR 98 | Temp 98.2°F | Wt 131.8 lb

## 2018-12-07 DIAGNOSIS — J069 Acute upper respiratory infection, unspecified: Secondary | ICD-10-CM

## 2018-12-07 MED ORDER — AZELASTINE HCL 0.1 % NA SOLN: 2.0000 | Freq: Two times a day (BID) | NASAL | 0 refills | Status: DC

## 2018-12-07 MED ORDER — AZITHROMYCIN 250 MG PO TABS: ORAL_TABLET | ORAL | 0 refills | Status: DC

## 2018-12-07 MED ORDER — PSEUDOEPH-BROMPHEN-DM 30-2-10 MG/5ML PO SYRP: 5.0000 mL | ORAL_SOLUTION | Freq: Four times a day (QID) | ORAL | 0 refills | Status: AC | PRN

## 2018-12-07 MED ORDER — ALBUTEROL SULFATE HFA 108 (90 BASE) MCG/ACT IN AERS: 2.0000 | INHALATION_SPRAY | Freq: Four times a day (QID) | RESPIRATORY_TRACT | 0 refills | Status: DC | PRN

## 2018-12-07 MED ORDER — AEROCHAMBER PLUS FLO-VU MISC: 0 refills | Status: AC

## 2018-12-07 NOTE — Progress Notes (Signed)
Subjective:    Patient ID: Madison [name], female    DOB: 1986-05-12, 33 y.o.   MRN: [id number]  The patient is a 33 year old female who presents today for complaints of upper respiratory symptoms for the past 7 days.  Patient complains of a cough productive of white sputum, clear nasal drainage, runny nose, fatigue, and shortness of breath with deep breathing.  Patient also informs that she has noticed that her heart rate is fluctuating about 20 points higher sometimes with taking a deep breath, when at rest and with mild exertion.  Patient also informs that she has been sleeping more over the past 7 days.  Patient informs that she was recently started on Nexium by her sports medicine doctor for a stomach ulcer, and she states that since that time she has stopped the Nexium as she felt this may be impacting her heart rate.  The patient spoke with her sports medicine physician and he informed her that it most likely was not.  The patient denies fever, chills, headache, wheezing, difficulty breathing, abdominal pain, nausea, vomiting, or diarrhea.  Patient does have a history of asthma and allergies, patient takes Claritin daily, but has not taken any medications for her URI symptoms.  Patient informs she has not had any recent asthma exacerbations, but cannot take prednisone as she lost feeling in her face and hands as a result.  URI   This is a new problem. The current episode started in the past 7 days. There has been no fever. Associated symptoms include congestion, coughing, a plugged ear sensation, rhinorrhea and sinus pain. Pertinent negatives include no abdominal pain, chest pain, diarrhea, ear pain, headaches, nausea, sore throat, swollen glands or wheezing. She has tried nothing for the symptoms.    I have reviewed the patient's past medical history, current medications, and allergies.  Past Medical History:  Diagnosis Date  . Allergy   . Asthma   . Hx of migraines 01/012003   can  tell when they come on, treats with excedrin migraine.  . Scoliosis 10/24/1995  . Vitamin D deficiency     Review of Systems  Constitutional: Positive for activity change, appetite change and fatigue. Negative for fever.  HENT: Positive for congestion, rhinorrhea, sinus pressure and sinus pain. Negative for ear pain and sore throat.   Eyes: Negative.   Respiratory: Positive for cough, chest tightness and shortness of breath. Negative for wheezing.   Cardiovascular: Negative.  Negative for chest pain.  Gastrointestinal: Negative.  Negative for abdominal pain, diarrhea and nausea.  Skin: Negative.   Allergic/Immunologic: Positive for environmental allergies.  Neurological: Negative for headaches.       Objective:   Physical Exam Vitals signs reviewed.  Constitutional:      General: She is not in acute distress. HENT:     Head: Normocephalic.     Right Ear: Tympanic membrane, ear canal and external ear normal.     Left Ear: Tympanic membrane, ear canal and external ear normal.     Nose: Rhinorrhea (clear drainage) present.     Right Turbinates: Enlarged and swollen.     Left Turbinates: Enlarged and swollen.     Right Sinus: Maxillary sinus tenderness and frontal sinus tenderness present.     Left Sinus: Maxillary sinus tenderness and frontal sinus tenderness present.     Mouth/Throat:     Mouth: Mucous membranes are moist.     Pharynx: No oropharyngeal exudate or posterior oropharyngeal erythema.  Eyes:  Pupils: Pupils are equal, round, and reactive to light.  Neck:     Musculoskeletal: Normal range of motion. No neck rigidity.  Cardiovascular:     Rate and Rhythm: Normal rate and regular rhythm.     Pulses: Normal pulses.     Heart sounds: Normal heart sounds.  Pulmonary:     Effort: Pulmonary effort is normal. No respiratory distress or retractions.     Breath sounds: Normal breath sounds and air entry.  Abdominal:     General: Abdomen is flat. Bowel sounds are  normal.     Tenderness: There is no abdominal tenderness.  Lymphadenopathy:     Cervical: No cervical adenopathy.  Skin:    General: Skin is warm and dry.     Capillary Refill: Capillary refill takes less than 2 seconds.  Neurological:     General: No focal deficit present.     Mental Status: She is alert and oriented to person, place, and time.     Cranial Nerves: No cranial nerve deficit.  Psychiatric:        Mood and Affect: Mood normal.        Thought Content: Thought content normal.       Assessment & Plan:   Exam findings, diagnosis etiology and medication use and indications reviewed with patient. Follow- Up and discharge instructions provided. No emergent/urgent issues found on exam.  Based on the patient's clinical presentation, symptoms, and physical assessment, patient symptoms are most likely of viral etiology.  However, based on the patient's H&P, and symptoms, I am concerned that she is having a mild asthma exacerbation.  I am going to prescribe the patient an albuterol inhaler, along with azithromycin if her symptoms do not improve within the next 3 to 5 days to cover her for any risk of bacterial infection or secondary infection.  I have also prescribed the patient Bromfed to help with her cough, and a nasal saline spray to help with her sinus symptoms.  I discussed at length with the patient to monitor her heart rate, increasing fluids, and seeing if her current respiratory symptoms are contributing to her heart rate.  Informed the patient that I would like her to establish primary care as I feel she does need a PCP that can monitor her asthma symptoms.  Also informed the patient that if her symptoms do not improve, I would like her to follow-up in the clinic that may be able to perform a chest x-ray if she continues to have chest tightness and shortness of breath.  I was unable to prescribe prednisone or steroid to the patient due to her allergy.  So I was more cautious with this  patient as I do not want her to have a worsening asthma exacerbation.  Patient is currently on birth control; however, I do not feel her chest tightness is an indication of a blood clot.  Patient did not describe any sharp pains to her chest, sudden onset of shortness of breath, or chest pain.  Patient's vitals were stable while in our office, patient was in no acute distress, and lungs CTAB.  Patient education was provided. Patient verbalized understanding of information provided and agrees with plan of care (POC), all questions answered. The patient is advised to call or return to clinic if condition does not see an improvement in symptoms, or to seek the care of the closest emergency department if condition worsens with the above plan.   1. Upper respiratory tract infection, unspecified type  -  albuterol (PROVENTIL HFA;VENTOLIN HFA) 108 (90 Base) MCG/ACT inhaler; Inhale 2 puffs into the lungs every 6 (six) hours as needed for up to 10 days.  Dispense: 1 Inhaler; Refill: 0 - azelastine (ASTELIN) 0.1 % nasal spray; Place 2 sprays into both nostrils 2 (two) times daily for 10 days. Use in each nostril as directed  Dispense: 30 mL; Refill: 0 - brompheniramine-pseudoephedrine-DM 30-2-10 MG/5ML syrup; Take 5 mLs by mouth 4 (four) times daily as needed for up to 7 days.  Dispense: 150 mL; Refill: 0 - Spacer/Aero-Holding Chambers (AEROCHAMBER PLUS WITH MASK) inhaler; Use as directed.  Dispense: 1 each; Refill: 0 - azithromycin (ZITHROMAX) 250 MG tablet; Take as directed.  Dispense: 6 tablet; Refill: 0 -Take medication as prescribed. -Ibuprofen or Tylenol for pain, fever, or general discomfort. -Increase fluids.  I would also like you to monitor your heart rate to see if this has an impact on your resting heart rate. -Get plenty rest. -As discussed, I would like for you to attempt to find a PCP.  I recommend the following offices: Molli Barrows, FNP-C, [facility]- [phone] Dr.  Garret Reddish, [place] -Sleep elevated on 2 pillows to help with cough at bedtime. -May also use humidifier or vaporizer to help with cough when at home and during sleep. -If your symptoms do not improve within the next 7 to 10 days, I would like for you to follow-up.  If you are still having episodes of chest tightness or feelings of shortness of breath, I would like for you to follow-up in our clinic that has the ability to perform a chest x-ray.

## 2018-12-07 NOTE — Patient Instructions (Addendum)
Upper Respiratory Infection, Adult -Take medication as prescribed. -Ibuprofen or Tylenol for pain, fever, or general discomfort. -Increase fluids.  I would also like you to monitor your heart rate to see if this has an impact on your resting heart rate. -Get plenty rest. -As discussed, I would like for you to attempt to find a PCP.  I recommend the following offices: Molli Barrows, FNP-C, [facility]- [phone] Dr. Garret Reddish, [place] -Sleep elevated on 2 pillows to help with cough at bedtime. -May also use humidifier or vaporizer to help with cough when at home and during sleep. -If your symptoms do not improve within the next 7 to 10 days, I would like for you to follow-up.  If you are still having episodes of chest tightness or feelings of shortness of breath, I would like for you to follow-up in our clinic that has the ability to perform a chest x-ray.   An upper respiratory infection (URI) is a common viral infection of the nose, throat, and upper air passages that lead to the lungs. The most common type of URI is the common cold. URIs usually get better on their own, without medical treatment. What are the causes? A URI is caused by a virus. You may catch a virus by:  Breathing in droplets from an infected person's cough or sneeze.  Touching something that has been exposed to the virus (contaminated) and then touching your mouth, nose, or eyes. What increases the risk? You are more likely to get a URI if:  You are very young or very old.  It is autumn or winter.  You have close contact with others, such as at a daycare, school, or health care facility.  You smoke.  You have long-term (chronic) heart or lung disease.  You have a weakened disease-fighting (immune) system.  You have nasal allergies or asthma.  You are experiencing a lot of stress.  You work in an area that has poor air circulation.  You have poor  nutrition. What are the signs or symptoms? A URI usually involves some of the following symptoms:  Runny or stuffy (congested) nose.  Sneezing.  Cough.  Sore throat.  Headache.  Fatigue.  Fever.  Loss of appetite.  Pain in your forehead, behind your eyes, and over your cheekbones (sinus pain).  Muscle aches.  Redness or irritation of the eyes.  Pressure in the ears or face. How is this diagnosed? This condition may be diagnosed based on your medical history and symptoms, and a physical exam. Your health care provider may use a cotton swab to take a mucus sample from your nose (nasal swab). This sample can be tested to determine what virus is causing the illness. How is this treated? URIs usually get better on their own within 7-10 days. You can take steps at home to relieve your symptoms. Medicines cannot cure URIs, but your health care provider may recommend certain medicines to help relieve symptoms, such as:  Over-the-counter cold medicines.  Cough suppressants. Coughing is a type of defense against infection that helps to clear the respiratory system, so take these medicines only as recommended by your health care provider.  Fever-reducing medicines. Follow these instructions at home: Activity  Rest as needed.  If you have a fever, stay home from work or school until your fever is gone or until your health care provider says you are no longer contagious. Your health care provider may have you wear  a face mask to prevent your infection from spreading. Relieving symptoms  Gargle with a salt-water mixture 3-4 times a day or as needed. To make a salt-water mixture, completely dissolve -1 tsp of salt in 1 cup of warm water.  Use a cool-mist humidifier to add moisture to the air. This can help you breathe more easily. Eating and drinking   Drink enough fluid to keep your urine pale yellow.  Eat soups and other clear broths. General instructions   Take  over-the-counter and prescription medicines only as told by your health care provider. These include cold medicines, fever reducers, and cough suppressants.  Do not use any products that contain nicotine or tobacco, such as cigarettes and e-cigarettes. If you need help quitting, ask your health care provider.  Stay away from secondhand smoke.  Stay up to date on all immunizations, including the yearly (annual) flu vaccine.  Keep all follow-up visits as told by your health care provider. This is important. How to prevent the spread of infection to others   URIs can be passed from person to person (are contagious). To prevent the infection from spreading: ? Wash your hands often with soap and water. If soap and water are not available, use hand sanitizer. ? Avoid touching your mouth, face, eyes, or nose. ? Cough or sneeze into a tissue or your sleeve or elbow instead of into your hand or into the air. Contact a health care provider if:  You are getting worse instead of better.  You have a fever or chills.  Your mucus is brown or red.  You have yellow or brown discharge coming from your nose.  You have pain in your face, especially when you bend forward.  You have swollen neck glands.  You have pain while swallowing.  You have white areas in the back of your throat. Get help right away if:  You have shortness of breath that gets worse.  You have severe or persistent: ? Headache. ? Ear pain. ? Sinus pain. ? Chest pain.  You have chronic lung disease along with any of the following: ? Wheezing. ? Prolonged cough. ? Coughing up blood. ? A change in your usual mucus.  You have a stiff neck.  You have changes in your: ? Vision. ? Hearing. ? Thinking. ? Mood. Summary  An upper respiratory infection (URI) is a common infection of the nose, throat, and upper air passages that lead to the lungs.  A URI is caused by a virus.  URIs usually get better on their own  within 7-10 days.  Medicines cannot cure URIs, but your health care provider may recommend certain medicines to help relieve symptoms. This information is not intended to replace advice given to you by your health care provider. Make sure you discuss any questions you have with your health care provider. Document Released: 04/04/2001 Document Revised: 05/25/2017 Document Reviewed: 05/25/2017 Elsevier Interactive Patient Education  2019 [employer]  Sinusitis, Adult Sinusitis is inflammation of your sinuses. Sinuses are hollow spaces in the bones around your face. Your sinuses are located:  Around your eyes.  In the middle of your forehead.  Behind your nose.  In your cheekbones. Mucus normally drains out of your sinuses. When your nasal tissues become inflamed or swollen, mucus can become trapped or blocked. This allows bacteria, viruses, and fungi to grow, which leads to infection. Most infections of the sinuses are caused by a virus. Sinusitis can develop quickly. It can last for up to  4 weeks (acute) or for more than 12 weeks (chronic). Sinusitis often develops after a cold. What are the causes? This condition is caused by anything that creates swelling in the sinuses or stops mucus from draining. This includes:  Allergies.  Asthma.  Infection from bacteria or viruses.  Deformities or blockages in your nose or sinuses.  Abnormal growths in the nose (nasal polyps).  Pollutants, such as chemicals or irritants in the air.  Infection from fungi (rare). What increases the risk? You are more likely to develop this condition if you:  Have a weak body defense system (immune system).  Do a lot of swimming or diving.  Overuse nasal sprays.  Smoke. What are the signs or symptoms? The main symptoms of this condition are pain and a feeling of pressure around the affected sinuses. Other symptoms include:  Stuffy nose or congestion.  Thick drainage from your nose.  Swelling  and warmth over the affected sinuses.  Headache.  Upper toothache.  A cough that may get worse at night.  Extra mucus that collects in the throat or the back of the nose (postnasal drip).  Decreased sense of smell and taste.  Fatigue.  A fever.  Sore throat.  Bad breath. How is this diagnosed? This condition is diagnosed based on:  Your symptoms.  Your medical history.  A physical exam.  Tests to find out if your condition is acute or chronic. This may include: ? Checking your nose for nasal polyps. ? Viewing your sinuses using a device that has a light (endoscope). ? Testing for allergies or bacteria. ? Imaging tests, such as an MRI or CT scan. In rare cases, a bone biopsy may be done to rule out more serious types of fungal sinus disease. How is this treated? Treatment for sinusitis depends on the cause and whether your condition is chronic or acute.  If caused by a virus, your symptoms should go away on their own within 10 days. You may be given medicines to relieve symptoms. They include: ? Medicines that shrink swollen nasal passages (topical intranasal decongestants). ? Medicines that treat allergies (antihistamines). ? A spray that eases inflammation of the nostrils (topical intranasal corticosteroids). ? Rinses that help get rid of thick mucus in your nose (nasal saline washes).  If caused by bacteria, your health care provider may recommend waiting to see if your symptoms improve. Most bacterial infections will get better without antibiotic medicine. You may be given antibiotics if you have: ? A severe infection. ? A weak immune system.  If caused by narrow nasal passages or nasal polyps, you may need to have surgery. Follow these instructions at home: Medicines  Take, use, or apply over-the-counter and prescription medicines only as told by your health care provider. These may include nasal sprays.  If you were prescribed an antibiotic medicine, take it  as told by your health care provider. Do not stop taking the antibiotic even if you start to feel better. Hydrate and humidify   Drink enough fluid to keep your urine pale yellow. Staying hydrated will help to thin your mucus.  Use a cool mist humidifier to keep the humidity level in your home above 50%.  Inhale steam for 10-15 minutes, 3-4 times a day, or as told by your health care provider. You can do this in the bathroom while a hot shower is running.  Limit your exposure to cool or dry air. Rest  Rest as much as possible.  Sleep with your head  raised (elevated).  Make sure you get enough sleep each night. General instructions   Apply a warm, moist washcloth to your face 3-4 times a day or as told by your health care provider. This will help with discomfort.  Wash your hands often with soap and water to reduce your exposure to germs. If soap and water are not available, use hand sanitizer.  Do not smoke. Avoid being around people who are smoking (secondhand smoke).  Keep all follow-up visits as told by your health care provider. This is important. Contact a health care provider if:  You have a fever.  Your symptoms get worse.  Your symptoms do not improve within 10 days. Get help right away if:  You have a severe headache.  You have persistent vomiting.  You have severe pain or swelling around your face or eyes.  You have vision problems.  You develop confusion.  Your neck is stiff.  You have trouble breathing. Summary  Sinusitis is soreness and inflammation of your sinuses. Sinuses are hollow spaces in the bones around your face.  This condition is caused by nasal tissues that become inflamed or swollen. The swelling traps or blocks the flow of mucus. This allows bacteria, viruses, and fungi to grow, which leads to infection.  If you were prescribed an antibiotic medicine, take it as told by your health care provider. Do not stop taking the antibiotic even  if you start to feel better.  Keep all follow-up visits as told by your health care provider. This is important. This information is not intended to replace advice given to you by your health care provider. Make sure you discuss any questions you have with your health care provider. Document Released: 10/09/2005 Document Revised: 03/11/2018 Document Reviewed: 03/11/2018 Elsevier Interactive Patient Education  2019 [employer].

## 2018-12-09 DIAGNOSIS — L821 Other seborrheic keratosis: Secondary | ICD-10-CM | POA: Diagnosis not present

## 2018-12-09 DIAGNOSIS — D225 Melanocytic nevi of trunk: Secondary | ICD-10-CM | POA: Diagnosis not present

## 2018-12-09 DIAGNOSIS — D485 Neoplasm of uncertain behavior of skin: Secondary | ICD-10-CM | POA: Diagnosis not present

## 2018-12-09 DIAGNOSIS — Z1283 Encounter for screening for malignant neoplasm of skin: Secondary | ICD-10-CM | POA: Diagnosis not present

## 2018-12-17 ENCOUNTER — Other Ambulatory Visit: Payer: Self-pay | Admitting: Obstetrics and Gynecology

## 2018-12-17 ENCOUNTER — Other Ambulatory Visit (HOSPITAL_COMMUNITY)
Admission: RE | Admit: 2018-12-17 | Discharge: 2018-12-17 | Disposition: A | Discharge status: Home / Self Care | Payer: 59 | Source: Ambulatory Visit | Attending: Obstetrics and Gynecology | Admitting: Obstetrics and Gynecology

## 2018-12-17 DIAGNOSIS — Z01419 Encounter for gynecological examination (general) (routine) without abnormal findings: Secondary | ICD-10-CM | POA: Insufficient documentation

## 2018-12-17 DIAGNOSIS — Z3041 Encounter for surveillance of contraceptive pills: Secondary | ICD-10-CM | POA: Diagnosis not present

## 2018-12-26 LAB — CYTOLOGY - PAP
Diagnosis: NEGATIVE
HPV 16/18/45 genotyping: NEGATIVE
HPV: DETECTED — AB

## 2018-12-30 NOTE — Progress Notes (Signed)
[name] [address] [employer], [place] [address] Phone: [phone] Subjective:     CC: Low back pain follow-up  [id number]  Madison [name] is a 33 y.o. female coming in with complaint of back pain. Has been having decrease in her pain with OMT. Is here for OMT today. Had relief for a few weeks. Is usually sore after manipulation. Does still have pressure in her chest.     Significant scoliosis noted.  Patient feels overall has been doing somewhat better.  Past Medical History:  Diagnosis Date  . Allergy   . Asthma   . Hx of migraines 01/012003   can tell when they come on, treats with excedrin migraine.  . Scoliosis 10/24/1995  . Vitamin D deficiency    Past Surgical History:  Procedure Laterality Date  . partial spinal fusion C7-T2 due to scoliosis 03/1998 N/A 04/11/1998  . TONSILLECTOMY     Social History   Socioeconomic History  . Marital status: Married    Spouse name: Not on file  . Number of children: 0  . Years of education: Not on file  . Highest education level: Not on file  Occupational History  . Not on file  Social Needs  . Financial resource strain: Not on file  . Food insecurity:    Worry: Not on file    Inability: Not on file  . Transportation needs:    Medical: Not on file    Non-medical: Not on file  Tobacco Use  . Smoking status: Never Smoker  . Smokeless tobacco: Never Used  Substance and Sexual Activity  . Alcohol use: Not on file  . Drug use: No  . Sexual activity: Yes    Partners: Male    Birth control/protection: I.U.D.    Comment: skyla  Lifestyle  . Physical activity:    Days per week: Not on file    Minutes per session: Not on file  . Stress: Not on file  Relationships  . Social connections:    Talks on phone: Not on file    Gets together: Not on file    Attends religious service: Not on file    Active member of club or organization: Not on file    Attends meetings of clubs or  organizations: Not on file    Relationship status: Not on file  Other Topics Concern  . Not on file  Social History Narrative  . Not on file   Allergies  Allergen Reactions  . Prednisone     Tingling in hands and face   Family History  Problem Relation Age of Onset  . Breast cancer Maternal Grandmother   . Cancer Maternal Grandmother   . Hearing loss Father   . Prostate cancer Maternal Grandfather   . Cancer Maternal Grandfather   . Heart disease Maternal Grandfather   . Brain cancer Paternal Grandmother   . Cancer Paternal Grandmother   . Melanoma Paternal Grandfather   . Cancer Paternal Grandfather   . Colon cancer Paternal Aunt     Current Outpatient Medications (Endocrine & Metabolic):  Marland Kitchen  Norethindrone-Ethinyl Estradiol-Fe Biphas (LO LOESTRIN FE) 1 MG-10 MCG / 10 MCG tablet, Take 1 tablet by mouth daily.   Current Outpatient Medications (Respiratory):  .  fexofenadine (ALLEGRA) 30 MG tablet, Take 30 mg by mouth daily.  Marland Kitchen  albuterol (PROVENTIL HFA;VENTOLIN HFA) 108 (90 Base) MCG/ACT inhaler, Inhale 2 puffs into the lungs every 6 (six) hours as needed for  up to 10 days. Marland Kitchen  azelastine (ASTELIN) 0.1 % nasal spray, Place 2 sprays into both nostrils 2 (two) times daily for 10 days. Use in each nostril as directed  Current Outpatient Medications (Analgesics):  .  meloxicam (MOBIC) 15 MG tablet, Take 1 tablet (15 mg total) by mouth daily.   Current Outpatient Medications (Other):  .  azithromycin (ZITHROMAX) 250 MG tablet, Take as directed. Marland Kitchen  esomeprazole (NEXIUM) 10 MG packet, Take 10 mg by mouth daily before breakfast. .  Spacer/Aero-Holding Chambers (AEROCHAMBER PLUS WITH MASK) inhaler, Use as directed.    Past medical history, social, surgical and family history all reviewed in electronic medical record.  No pertanent information unless stated regarding to the chief complaint.   Review of Systems:  No headache, visual changes, nausea, vomiting, diarrhea,  constipation, dizziness, abdominal pain, skin rash, fevers, chills, night sweats, weight loss, swollen lymph nodes, body aches, joint swelling, chest pain, shortness of breath, mood changes.  Positive muscle aches  Objective  Blood pressure 100/70, pulse 69, height 5' 3"  (1.6 m), weight 131 lb (59.4 kg), SpO2 99 %.   General: No apparent distress alert and oriented x3 mood and affect normal, dressed appropriately.  HEENT: Pupils equal, extraocular movements intact  Respiratory: Patient's speak in full sentences and does not appear short of breath  Cardiovascular: No lower extremity edema, non tender, no erythema  Skin: Warm dry intact with no signs of infection or rash on extremities or on axial skeleton.  Abdomen: Soft nontender  Neuro: Cranial nerves II through XII are intact, neurovascularly intact in all extremities with 2+ DTRs and 2+ pulses.  Lymph: No lymphadenopathy of posterior or anterior cervical chain or axillae bilaterally.  Gait normal with good balance and coordination.  MSK:  Non tender with full range of motion and good stability and symmetric strength and tone of shoulders, elbows, wrist, hip, knee and ankles bilaterally.  Back Exam:  Inspection: Loss of lordosis significant scoliosis Motion: Flexion 45 deg, Extension 25 deg, Side Bending to 45 deg bilaterally,  Rotation to 25 deg bilaterally  SLR laying: Negative  XSLR laying: Negative  Palpable tenderness: Tender to palpation of paraspinal musculature lumbar spine right greater than left. FABER: Faber positive. Sensory change: Gross sensation intact to all lumbar and sacral dermatomes.  Reflexes: 2+ at both patellar tendons, 2+ at achilles tendons, Babinski's downgoing.  Strength at foot  Plantar-flexion: 5/5 Dorsi-flexion: 5/5 Eversion: 5/5 Inversion: 5/5  Leg strength  Quad: 5/5 Hamstring: 5/5 Hip flexor: 5/5 Hip abductors: 5/5  Gait unremarkable.  Osteopathic findings  T9 extended rotated and side bent left L2  flexed rotated and side bent right Sacrum right on right    Impression and Recommendations:     This case required medical decision making of moderate complexity. The above documentation has been reviewed and is accurate and complete Madison Pulley, DO       Note: This dictation was prepared with Dragon dictation along with smaller phrase technology. Any transcriptional errors that result from this process are unintentional.

## 2018-12-31 ENCOUNTER — Ambulatory Visit (INDEPENDENT_AMBULATORY_CARE_PROVIDER_SITE_OTHER): Payer: 59 | Admitting: Family Medicine

## 2018-12-31 ENCOUNTER — Encounter: Payer: Self-pay | Admitting: Family Medicine

## 2018-12-31 VITALS — BP 100/70 | HR 69 | Ht 63.0 in | Wt 131.0 lb

## 2018-12-31 DIAGNOSIS — M999 Biomechanical lesion, unspecified: Secondary | ICD-10-CM

## 2018-12-31 DIAGNOSIS — M41125 Adolescent idiopathic scoliosis, thoracolumbar region: Secondary | ICD-10-CM | POA: Diagnosis not present

## 2018-12-31 NOTE — Assessment & Plan Note (Signed)
Patient is doing much better overall.  Discussed posture and ergonomics.  Discussed home exercises.  Discussed continue to stay active.  Given a trial of a inhaled corticosteroid.  Worsening symptoms patient will stop.  Patient is going to monitor for the side effects.  Follow-up again in 4 to 6 weeks

## 2018-12-31 NOTE — Assessment & Plan Note (Signed)
Decision today to treat with OMT was based on Physical Exam  After verbal consent patient was treated with HVLA, ME, FPR techniques in  thoracic, lumbar and sacral areas  Patient tolerated the procedure well with improvement in symptoms  Patient given exercises, stretches and lifestyle modifications  See medications in patient instructions if given  Patient will follow up in 4-6 weeks 

## 2018-12-31 NOTE — Patient Instructions (Signed)
Saunders Glance are awesome  See me again in 5ish weeks

## 2019-01-13 MED FILL — LO LOESTRIN FE 1-10 TABLET: 1 MG-10 MCG | 84 days supply | Qty: 84 | Fill #0

## 2019-01-20 ENCOUNTER — Telehealth: Payer: 59 | Admitting: Family

## 2019-01-20 DIAGNOSIS — R110 Nausea: Secondary | ICD-10-CM | POA: Diagnosis not present

## 2019-01-20 MED ORDER — ONDANSETRON HCL 4 MG PO TABS: 4.0000 mg | ORAL_TABLET | Freq: Three times a day (TID) | ORAL | 0 refills | Status: DC | PRN

## 2019-01-20 NOTE — Progress Notes (Signed)
We are sorry that you are not feeling well. Here is how we plan to help!  Based on what you have shared with me it looks like you have Nausea.   Although nausea and vomiting can make you feel miserable, it's important to remember that these are not diseases, but rather symptoms of an underlying illness.  When we treat short term symptoms, we always caution that any symptoms that persist should be fully evaluated in a medical office.  I have prescribed a medication that will help alleviate your symptoms and allow you to stay hydrated:  Zofran 4 mg 1 tablet every 8 hours as needed for nausea and vomiting  HOME CARE:  Drink clear liquids.  This is very important! Dehydration (the lack of fluid) can lead to a serious complication.  Start off with 1 tablespoon every 5 minutes for 8 hours.  You may begin eating bland foods after 8 hours without vomiting.  Start with saltine crackers, white bread, rice, mashed potatoes, applesauce.  After 48 hours on a bland diet, you may resume a normal diet.  Try to go to sleep.  Sleep often empties the stomach and relieves the need to vomit.  GET HELP RIGHT AWAY IF:   Your symptoms do not improve or worsen within 2 days after treatment.  You have a fever for over 3 days.  You cannot keep down fluids after trying the medication.  MAKE SURE YOU:   Understand these instructions.  Will watch your condition.  Will get help right away if you are not doing well or get worse.   Thank you for choosing an e-visit. Your e-visit answers were reviewed by a board certified advanced clinical practitioner to complete your personal care plan. Depending upon the condition, your plan could have included both over the counter or prescription medications. Please review your pharmacy choice. Be sure that the pharmacy you have chosen is open so that you can pick up your prescription now.  If there is a problem you may message your provider in [place] to have the  prescription routed to another pharmacy. Your safety is important to [place]. If you have drug allergies check your prescription carefully.  For the next 24 hours, you can use MyChart to ask questions about today's visit, request a non-urgent call back, or ask for a work or school excuse from your e-visit provider. You will get an e-mail in the next two days asking about your experience. I hope that your e-visit has been valuable and will speed your recovery.

## 2019-01-22 ENCOUNTER — Encounter: Payer: Self-pay | Admitting: Family Medicine

## 2019-02-04 ENCOUNTER — Ambulatory Visit: Payer: 59 | Admitting: Family Medicine

## 2019-03-18 NOTE — Progress Notes (Signed)
[facility] [address], [place] [address] Phone: [phone] Subjective:    I'm seeing this patient by the request  of:    CC: Back pain follow-up  ERD:[id number]     Update 03/19/2019: Madison [name] is a 33 y.o. female coming in with complaint of lower back pain on the right side. Patient feels like her back needs to pop. Intermittent pain with sitting increasing her pain.  Patient was found to have more of a significant scoliosis in the thoracolumbar and lumbosacral junctions.  Has been doing relatively well.  Patient now has not been doing her exercises on a regular routine.  Patient has been working out doing different video classes intermittently.  Feels when she works out she does feel better.  Walking her dog 3 miles a day as well.      Past Medical History:  Diagnosis Date  . Allergy   . Asthma   . Hx of migraines 01/012003   can tell when they come on, treats with excedrin migraine.  . Scoliosis 10/24/1995  . Vitamin D deficiency    Past Surgical History:  Procedure Laterality Date  . partial spinal fusion C7-T2 due to scoliosis 03/1998 N/A 04/11/1998  . TONSILLECTOMY     Social History   Socioeconomic History  . Marital status: Married    Spouse name: Not on file  . Number of children: 0  . Years of education: Not on file  . Highest education level: Not on file  Occupational History  . Not on file  Social Needs  . Financial resource strain: Not on file  . Food insecurity:    Worry: Not on file    Inability: Not on file  . Transportation needs:    Medical: Not on file    Non-medical: Not on file  Tobacco Use  . Smoking status: Never Smoker  . Smokeless tobacco: Never Used  Substance and Sexual Activity  . Alcohol use: Not on file  . Drug use: No  . Sexual activity: Yes    Partners: Male    Birth control/protection: I.U.D.    Comment: skyla  Lifestyle  . Physical activity:    Days per week: Not on file     Minutes per session: Not on file  . Stress: Not on file  Relationships  . Social connections:    Talks on phone: Not on file    Gets together: Not on file    Attends religious service: Not on file    Active member of club or organization: Not on file    Attends meetings of clubs or organizations: Not on file    Relationship status: Not on file  Other Topics Concern  . Not on file  Social History Narrative  . Not on file   Allergies  Allergen Reactions  . Prednisone     Tingling in hands and face   Family History  Problem Relation Age of Onset  . Breast cancer Maternal Grandmother   . Cancer Maternal Grandmother   . Hearing loss Father   . Prostate cancer Maternal Grandfather   . Cancer Maternal Grandfather   . Heart disease Maternal Grandfather   . Brain cancer Paternal Grandmother   . Cancer Paternal Grandmother   . Melanoma Paternal Grandfather   . Cancer Paternal Grandfather   . Colon cancer Paternal Aunt     Current Outpatient Medications (Endocrine & Metabolic):  Marland Kitchen  Norethindrone-Ethinyl Estradiol-Fe Biphas (LO LOESTRIN FE)  1 MG-10 MCG / 10 MCG tablet, Take 1 tablet by mouth daily.   Current Outpatient Medications (Respiratory):  .  fexofenadine (ALLEGRA) 30 MG tablet, Take 30 mg by mouth daily.  Marland Kitchen  albuterol (PROVENTIL HFA;VENTOLIN HFA) 108 (90 Base) MCG/ACT inhaler, Inhale 2 puffs into the lungs every 6 (six) hours as needed for up to 10 days. Marland Kitchen  azelastine (ASTELIN) 0.1 % nasal spray, Place 2 sprays into both nostrils 2 (two) times daily for 10 days. Use in each nostril as directed  Current Outpatient Medications (Analgesics):  .  meloxicam (MOBIC) 15 MG tablet, Take 1 tablet (15 mg total) by mouth daily.   Current Outpatient Medications (Other):  .  azithromycin (ZITHROMAX) 250 MG tablet, Take as directed. Marland Kitchen  esomeprazole (NEXIUM) 10 MG packet, Take 10 mg by mouth daily before breakfast. .  ondansetron (ZOFRAN) 4 MG tablet, Take 1 tablet (4 mg total) by  mouth every 8 (eight) hours as needed for nausea or vomiting. Marland Kitchen  Spacer/Aero-Holding Chambers (AEROCHAMBER PLUS WITH MASK) inhaler, Use as directed.    Past medical history, social, surgical and family history all reviewed in electronic medical record.  No pertanent information unless stated regarding to the chief complaint.   Review of Systems:  No headache, visual changes, nausea, vomiting, diarrhea, constipation, dizziness, abdominal pain, skin rash, fevers, chills, night sweats, weight loss, swollen lymph nodes, body aches, joint swelling,  chest pain, shortness of breath, mood changes.  Positive muscle aches  Objective  Blood pressure 104/72, pulse 78, height 5' 3"  (1.6 m), weight 133 lb (60.3 kg), SpO2 99 %.    General: No apparent distress alert and oriented x3 mood and affect normal, dressed appropriately.  HEENT: Pupils equal, extraocular movements intact  Respiratory: Patient's speak in full sentences and does not appear short of breath  Cardiovascular: No lower extremity edema, non tender, no erythema  Skin: Warm dry intact with no signs of infection or rash on extremities or on axial skeleton.  Abdomen: Soft nontender  Neuro: Cranial nerves II through XII are intact, neurovascularly intact in all extremities with 2+ DTRs and 2+ pulses.  Lymph: No lymphadenopathy of posterior or anterior cervical chain or axillae bilaterally.  Gait normal with good balance and coordination.  MSK:  Non tender with full range of motion and good stability and symmetric strength and tone of shoulders, elbows, wrist, hip, knee and ankles bilaterally.  Patient back exam shows significant scoliosis.  Levoscoliosis of the thoracolumbar juncture dextroscoliosis of the lumbosacral area.  Tenderness to palpation diffusely in the paraspinal musculature of the thoracolumbar juncture in the right sacroiliac joint   T3-T7 rotated right side bent left L1-L5 rotated left side bent right in neutral position  Sacrum right on right   Impression and Recommendations:     This case required medical decision making of moderate complexity. The above documentation has been reviewed and is accurate and complete Lyndal Pulley, DO       Note: This dictation was prepared with Dragon dictation along with smaller phrase technology. Any transcriptional errors that result from this process are unintentional.

## 2019-03-19 ENCOUNTER — Other Ambulatory Visit: Payer: Self-pay

## 2019-03-19 ENCOUNTER — Encounter: Payer: Self-pay | Admitting: Family Medicine

## 2019-03-19 ENCOUNTER — Ambulatory Visit (INDEPENDENT_AMBULATORY_CARE_PROVIDER_SITE_OTHER): Payer: 59 | Admitting: Family Medicine

## 2019-03-19 VITALS — BP 104/72 | HR 78 | Ht 63.0 in | Wt 133.0 lb

## 2019-03-19 DIAGNOSIS — M999 Biomechanical lesion, unspecified: Secondary | ICD-10-CM

## 2019-03-19 DIAGNOSIS — M41125 Adolescent idiopathic scoliosis, thoracolumbar region: Secondary | ICD-10-CM | POA: Diagnosis not present

## 2019-03-19 NOTE — Assessment & Plan Note (Signed)
Decision today to treat with OMT was based on Physical Exam  After verbal consent patient was treated with HVLA, ME, FPR techniques in  thoracic, lumbar and sacral areas  Patient tolerated the procedure well with improvement in symptoms  Patient given exercises, stretches and lifestyle modifications  See medications in patient instructions if given  Patient will follow up in 6-8 weeks

## 2019-03-19 NOTE — Assessment & Plan Note (Signed)
Significant scoliosis but patient is doing very well.  We will focus more on muscle energy today.  We discussed with patient about changing her workout routine from barre to more of a Pilates to try to change on some of the muscle imbalances.  Patient is in agreement with the plan.  Follow-up again in 6 to 8 weeks

## 2019-03-19 NOTE — Patient Instructions (Addendum)
Great to see you  Alvera Singh is your friend.  Stay active and find time for yourself Enjoy the dog  See me again in 6-8 weeks

## 2019-05-02 MED FILL — LO LOESTRIN FE 1-10 TABLET: 1 MG-10 MCG | 84 days supply | Qty: 84 | Fill #1

## 2019-05-14 ENCOUNTER — Ambulatory Visit: Payer: 59 | Admitting: Family Medicine

## 2019-05-15 ENCOUNTER — Telehealth: Payer: 59 | Admitting: Family

## 2019-05-15 DIAGNOSIS — J329 Chronic sinusitis, unspecified: Secondary | ICD-10-CM

## 2019-05-15 DIAGNOSIS — B9689 Other specified bacterial agents as the cause of diseases classified elsewhere: Secondary | ICD-10-CM

## 2019-05-15 MED ORDER — AMOXICILLIN-POT CLAVULANATE 875-125 MG PO TABS: 1.0000 | ORAL_TABLET | Freq: Two times a day (BID) | ORAL | 0 refills | Status: AC

## 2019-05-15 MED FILL — AMOX-CLAV 875-125 MG TABLET: 875-125 | 7 days supply | Qty: 14 | Fill #0

## 2019-05-15 NOTE — Progress Notes (Signed)
Greater than 5 minutes, yet less than 10 minutes of time have been spent researching, coordinating, and implementing care for this patient today.  Thank you for the details you included in the comment boxes. Those details are very helpful in determining the best course of treatment for you and help [place] to provide the best care.  We are sorry that you are not feeling well.  Here is how we plan to help!  Based on what you have shared with me it looks like you have sinusitis.  Sinusitis is inflammation and infection in the sinus cavities of the head.  Based on your presentation I believe you most likely have Acute Bacterial Sinusitis.  This is an infection caused by bacteria and is treated with antibiotics. I have prescribed Augmentin 877m/125mg one tablet twice daily with food, for 7 days. You may use an oral decongestant such as Mucinex D or if you have glaucoma or high blood pressure use plain Mucinex. Saline nasal spray help and can safely be used as often as needed for congestion.  If you develop worsening sinus pain, fever or notice severe headache and vision changes, or if symptoms are not better after completion of antibiotic, please schedule an appointment with a health care provider.    Sinus infections are not as easily transmitted as other respiratory infection, however we still recommend that you avoid close contact with loved ones, especially the very young and elderly.  Remember to wash your hands thoroughly throughout the day as this is the number one way to prevent the spread of infection!  Home Care:  Only take medications as instructed by your medical team.  Complete the entire course of an antibiotic.  Do not take these medications with alcohol.  A steam or ultrasonic humidifier can help congestion.  You can place a towel over your head and breathe in the steam from hot water coming from a faucet.  Avoid close contacts especially the very young and the elderly.  Cover your  mouth when you cough or sneeze.  Always remember to wash your hands.  Get Help Right Away If:  You develop worsening fever or sinus pain.  You develop a severe head ache or visual changes.  Your symptoms persist after you have completed your treatment plan.  Make sure you  Understand these instructions.  Will watch your condition.  Will get help right away if you are not doing well or get worse.  Your e-visit answers were reviewed by a board certified advanced clinical practitioner to complete your personal care plan.  Depending on the condition, your plan could have included both over the counter or prescription medications.  If there is a problem please reply  once you have received a response from your provider.  Your safety is important to [place]  If you have drug allergies check your prescription carefully.    You can use MyChart to ask questions about today's visit, request a non-urgent call back, or ask for a work or school excuse for 24 hours related to this e-Visit. If it has been greater than 24 hours you will need to follow up with your provider, or enter a new e-Visit to address those concerns.  You will get an e-mail in the next two days asking about your experience.  I hope that your e-visit has been valuable and will speed your recovery. Thank you for using e-visits.

## 2019-06-20 ENCOUNTER — Encounter: Payer: Self-pay | Admitting: Family Medicine

## 2019-06-20 ENCOUNTER — Ambulatory Visit (INDEPENDENT_AMBULATORY_CARE_PROVIDER_SITE_OTHER): Payer: 59 | Admitting: Family Medicine

## 2019-06-20 ENCOUNTER — Other Ambulatory Visit: Payer: Self-pay

## 2019-06-20 VITALS — BP 110/78 | HR 80 | Ht 63.0 in | Wt 135.0 lb

## 2019-06-20 DIAGNOSIS — M41125 Adolescent idiopathic scoliosis, thoracolumbar region: Secondary | ICD-10-CM | POA: Diagnosis not present

## 2019-06-20 DIAGNOSIS — M999 Biomechanical lesion, unspecified: Secondary | ICD-10-CM | POA: Diagnosis not present

## 2019-06-20 NOTE — Progress Notes (Signed)
[name] [address] [place], [place] [address] Phone: [phone] Subjective:   Madison Madison [name], am serving as a scribe for Dr. Hulan Saas.    CC: low back pain follow up   JGO:[id number]   03/19/2019 Significant scoliosis but patient is doing very well.  We will focus more on muscle energy today.  We discussed with patient about changing her workout routine from barre to more of a Pilates to try to change on some of the muscle imbalances.  Patient is in agreement with the plan.  Follow-up again in 6 to 8 weeks  Update 06/20/2019 Madison Madison [name] is a 33 y.o. female coming in with complaint of back pain. Feels that she needed to come sooner but did get sick. Pain on right side and radiating into right hip. Ergonomic changes include standing desk and chair. Madison [name] weakness or numbness.      Past Medical History:  Diagnosis Date  . Allergy   . Asthma   . Hx of migraines 01/012003   can tell when they come on, treats with excedrin migraine.  . Scoliosis 10/24/1995  . Vitamin D deficiency    Past Surgical History:  Procedure Laterality Date  . partial spinal fusion C7-T2 due to scoliosis 03/1998 N/A 04/11/1998  . TONSILLECTOMY     Social History   Socioeconomic History  . Marital status: Married    Spouse name: Not on file  . Number of children: 0  . Years of education: Not on file  . Highest education level: Not on file  Occupational History  . Not on file  Social Needs  . Financial resource strain: Not on file  . Food insecurity    Worry: Not on file    Inability: Not on file  . Transportation needs    Medical: Not on file    Non-medical: Not on file  Tobacco Use  . Smoking status: Never Smoker  . Smokeless tobacco: Never Used  Substance and Sexual Activity  . Alcohol use: Not on file  . Drug use: Madison [name]  . Sexual activity: Yes    Partners: Male    Birth control/protection: I.U.D.    Comment: skyla  Lifestyle  . Physical  activity    Days per week: Not on file    Minutes per session: Not on file  . Stress: Not on file  Relationships  . Social Herbalist on phone: Not on file    Gets together: Not on file    Attends religious service: Not on file    Active member of club or organization: Not on file    Attends meetings of clubs or organizations: Not on file    Relationship status: Not on file  Other Topics Concern  . Not on file  Social History Narrative  . Not on file   Allergies  Allergen Reactions  . Prednisone     Tingling in hands and face   Family History  Problem Relation Age of Onset  . Breast cancer Maternal Grandmother   . Cancer Maternal Grandmother   . Hearing loss Father   . Prostate cancer Maternal Grandfather   . Cancer Maternal Grandfather   . Heart disease Maternal Grandfather   . Brain cancer Paternal Grandmother   . Cancer Paternal Grandmother   . Melanoma Paternal Grandfather   . Cancer Paternal Grandfather   . Colon cancer Paternal Aunt     Current Outpatient Medications (Endocrine & Metabolic):  .  Norethindrone-Ethinyl Estradiol-Fe Biphas (LO LOESTRIN FE) 1 MG-10 MCG / 10 MCG tablet, Take 1 tablet by mouth daily.   Current Outpatient Medications (Respiratory):  .  fexofenadine (ALLEGRA) 30 MG tablet, Take 30 mg by mouth daily.  Marland Kitchen  albuterol (PROVENTIL HFA;VENTOLIN HFA) 108 (90 Base) MCG/ACT inhaler, Inhale 2 puffs into the lungs every 6 (six) hours as needed for up to 10 days. Marland Kitchen  azelastine (ASTELIN) 0.1 % nasal spray, Place 2 sprays into both nostrils 2 (two) times daily for 10 days. Use in each nostril as directed  Current Outpatient Medications (Analgesics):  .  meloxicam (MOBIC) 15 MG tablet, Take 1 tablet (15 mg total) by mouth daily.   Current Outpatient Medications (Other):  .  Spacer/Aero-Holding Chambers (AEROCHAMBER PLUS WITH MASK) inhaler, Use as directed. Marland Kitchen  azithromycin (ZITHROMAX) 250 MG tablet, Take as directed. Marland Kitchen  esomeprazole  (NEXIUM) 10 MG packet, Take 10 mg by mouth daily before breakfast. .  ondansetron (ZOFRAN) 4 MG tablet, Take 1 tablet (4 mg total) by mouth every 8 (eight) hours as needed for nausea or vomiting.    Past medical history, social, surgical and family history all reviewed in electronic medical record.  Madison [name] pertanent information unless stated regarding to the chief complaint.   Review of Systems:  Madison [name] headache, visual changes, nausea, vomiting, diarrhea, constipation, dizziness, abdominal pain, skin rash, fevers, chills, night sweats, weight loss, swollen lymph nodes, body aches, joint swelling,  chest pain, shortness of breath, mood changes.  +muscle aches   Objective  Blood pressure 110/78, pulse 80, height 5' 3"  (1.6 m), weight 135 lb (61.2 kg), SpO2 99 %.    General: Madison [name] apparent distress alert and oriented x3 mood and affect normal, dressed appropriately.  HEENT: Pupils equal, extraocular movements intact  Respiratory: Patient's speak in full sentences and does not appear short of breath  Cardiovascular: Madison [name] lower extremity edema, non tender, Madison [name] erythema  Skin: Warm dry intact with Madison [name] signs of infection or rash on extremities or on axial skeleton.  Abdomen: Soft nontender  Neuro: Cranial nerves II through XII are intact, neurovascularly intact in all extremities with 2+ DTRs and 2+ pulses.  Lymph: Madison [name] lymphadenopathy of posterior or anterior cervical chain or axillae bilaterally.  Gait normal with good balance and coordination.  MSK:  Non tender with full range of motion and good stability and symmetric strength and tone of shoulders, elbows, wrist, hip, knee and ankles bilaterally. Hypermobility    Back - Normal skin, Spine with scoliosis and Madison [name] deformity. TTP  RL back to palpation and over right GT Madison [name] tenderness to vertebral process palpation.  Paraspinous muscles are not tender and without spasm.   Range of motion is full at neck and lumbar sacral regions  Osteopathic findings  T9 extended  rotated and side bent left L2 flexed rotated and side bent right Sacrum right on right      Impression and Recommendations:     This case required medical decision making of moderate complexity. The above documentation has been reviewed and is accurate and complete Lyndal Pulley, DO       Note: This dictation was prepared with Dragon dictation along with smaller phrase technology. Any transcriptional errors that result from this process are unintentional.

## 2019-06-20 NOTE — Assessment & Plan Note (Signed)
Scoliosis noted.  Discussed HEP which activities to do  RTC in 4 weeks

## 2019-06-20 NOTE — Assessment & Plan Note (Signed)
Decision today to treat with OMT was based on Physical Exam  After verbal consent patient was treated with HVLA, ME techniques in thoracic, lumbar and sacral  areas  Patient tolerated the procedure well with improvement in symptoms  Patient given exercises, stretches and lifestyle modifications  See medications in patient instructions if given  Patient will follow up in 4-8 weeks

## 2019-06-20 NOTE — Patient Instructions (Signed)
Good to see you Exercises 3x a week Ice 90mn 2x a day See me in 4 weeks

## 2019-06-27 ENCOUNTER — Telehealth: Payer: Self-pay | Admitting: Family Medicine

## 2019-06-27 NOTE — Telephone Encounter (Signed)
Do you approve transfer?

## 2019-06-27 NOTE — Telephone Encounter (Signed)
Patient is calling is request a Transfer of Care from Dr. Martinique to Dr. Ethlyn Gallery.  And requesting an appt with Dr. Ethlyn Gallery for [place].  CB- [phone]

## 2019-06-27 NOTE — Telephone Encounter (Signed)
ok 

## 2019-06-27 NOTE — Telephone Encounter (Signed)
Fine with me

## 2019-07-02 NOTE — Telephone Encounter (Signed)
ok 

## 2019-07-02 NOTE — Telephone Encounter (Signed)
Please schedule with [place] for TOC. Thanks!

## 2019-07-02 NOTE — Telephone Encounter (Signed)
Dr. Ethlyn Gallery - Please advise on pt's request to transfer. Thank you!

## 2019-07-02 NOTE — Telephone Encounter (Signed)
Scheduled TOC appt with pt.

## 2019-07-09 ENCOUNTER — Encounter: Payer: Self-pay | Admitting: Family Medicine

## 2019-07-09 ENCOUNTER — Ambulatory Visit (INDEPENDENT_AMBULATORY_CARE_PROVIDER_SITE_OTHER): Payer: 59 | Admitting: Family Medicine

## 2019-07-09 ENCOUNTER — Other Ambulatory Visit: Payer: Self-pay

## 2019-07-09 DIAGNOSIS — Z13220 Encounter for screening for lipoid disorders: Secondary | ICD-10-CM

## 2019-07-09 DIAGNOSIS — Z131 Encounter for screening for diabetes mellitus: Secondary | ICD-10-CM | POA: Diagnosis not present

## 2019-07-09 DIAGNOSIS — J069 Acute upper respiratory infection, unspecified: Secondary | ICD-10-CM

## 2019-07-09 DIAGNOSIS — T7840XA Allergy, unspecified, initial encounter: Secondary | ICD-10-CM | POA: Diagnosis not present

## 2019-07-09 DIAGNOSIS — E559 Vitamin D deficiency, unspecified: Secondary | ICD-10-CM | POA: Diagnosis not present

## 2019-07-09 DIAGNOSIS — J4520 Mild intermittent asthma, uncomplicated: Secondary | ICD-10-CM

## 2019-07-09 MED ORDER — ALBUTEROL SULFATE HFA 108 (90 BASE) MCG/ACT IN AERS: 2.0000 | INHALATION_SPRAY | Freq: Four times a day (QID) | RESPIRATORY_TRACT | 2 refills | Status: DC | PRN

## 2019-07-09 MED ORDER — MONTELUKAST SODIUM 10 MG PO TABS: 10.0000 mg | ORAL_TABLET | Freq: Every day | ORAL | 1 refills | Status: DC

## 2019-07-09 MED FILL — MONTELUKAST SOD 10 MG TAB: 10 | 90 days supply | Qty: 90 | Fill #0

## 2019-07-09 MED FILL — ALBUTEROL SULFATE HFA 108 (: 108 (90 BAS | 25 days supply | Qty: 18 | Fill #0

## 2019-07-09 NOTE — Progress Notes (Signed)
Virtual Visit via Video Note  I connected with Madison [name]   on 07/09/19 at  1:00 PM EDT by a video enabled telemedicine application and verified that I am speaking with the correct person using two identifiers.  Location patient: home Location provider:work office Persons participating in the virtual visit: patient, provider  I discussed the limitations of evaluation and management by telemedicine and the availability of in person appointments. The patient expressed understanding and agreed to proceed.   Madison [name] DOB: 02-20-1986 Encounter date: 07/09/2019  This is a 33 y.o. female who presents to establish care. No chief complaint on file.   History of present illness:  Has had chronic sinus issues "since she was born". Did allergy shots for 5 years and completed these a few years ago. Allergy med otc just isn't working for her. Prefers not to repeat allergy shots. Using allegra, sudafed, mucinex prn. Medicated nasal sprays have typically given her migraines. Does use saline. Used to feel pressure in sinuses, but now feels more expansion in maxillary and frontal sinuses. Then gets a lot of fluid or feels like there is fluid in ears.    Low back pain - following with Charlann Boxer. This is working well for her.    Asthma: last albuterol inhaler use was in may. Usually just flares when allergies are really bad.   Hx of migraines: pressure shifts, stress trigger. Take excedrin when she gets them. Hasn't had one since February until Monday. excedrin does help these.   Follows with gyn- Christophe Louis with [facility]. Follows with derm- provider in Dr. [facility] practice.    Past Medical History:  Diagnosis Date  . Allergy   . Asthma   . Hx of migraines 01/012003   can tell when they come on, treats with excedrin migraine.  . Scoliosis 10/24/1995  . Vitamin D deficiency    Past Surgical History:  Procedure Laterality Date  . partial spinal fusion C7-T2 due to scoliosis 03/1998  N/A 04/11/1998  . TONSILLECTOMY    . WISDOM TOOTH EXTRACTION     Allergies  Allergen Reactions  . Prednisone     Tingling in hands and face  . Meloxicam     heartburn   No outpatient medications have been marked as taking for the 07/09/19 encounter (Office Visit) with Caren Macadam, MD.   Social History   Tobacco Use  . Smoking status: Never Smoker  . Smokeless tobacco: Never Used  Substance Use Topics  . Alcohol use: Yes    Comment: occasionally   Family History  Problem Relation Age of Onset  . Breast cancer Maternal Grandmother 32  . Osteoporosis Maternal Grandmother   . Heart failure Maternal Grandmother   . Healthy Mother   . Hearing loss Father   . Other Father        prostate level elevation  . Diabetes Mellitus II Brother        TWIN  . Prostate cancer Maternal Grandfather   . Heart disease Maternal Grandfather   . Heart failure Maternal Grandfather   . Brain cancer Paternal Grandmother   . Melanoma Paternal Grandfather   . Colon cancer Paternal Aunt      Review of Systems  Constitutional: Negative for chills, fatigue and fever.  HENT: Positive for congestion and sinus pressure. Negative for sinus pain and sore throat. Ear pain: fullness.   Respiratory: Negative for cough, chest tightness, shortness of breath and wheezing.   Cardiovascular: Negative for chest pain, palpitations  and leg swelling.    Objective:  There were no vitals taken for this visit.      BP Readings from Last 3 Encounters:  06/20/19 110/78  03/19/19 104/72  12/31/18 100/70   Wt Readings from Last 3 Encounters:  06/20/19 135 lb (61.2 kg)  03/19/19 133 lb (60.3 kg)  12/31/18 131 lb (59.4 kg)    EXAM:  GENERAL: alert, oriented, appears well and in no acute distress  HEENT: atraumatic, conjunctiva clear, no obvious abnormalities on inspection of external nose and ears  NECK: normal movements of the head and neck  LUNGS: on inspection no signs of respiratory distress,  breathing rate appears normal, no obvious gross SOB, gasping or wheezing  CV: no obvious cyanosis  MS: moves all visible extremities without noticeable abnormality  PSYCH/NEURO: pleasant and cooperative, no obvious depression or anxiety, speech and thought processing grossly intact  SKIN: no facial or neck abnormalities  Assessment/Plan  1. Allergic state, initial encounter Unable to tolerate nasal sprays. Continue allegra. Can add benadryl at bedtime. Add singulair. Continue with nasal saline. We will check back in 1-2 months time at physical but ok to refer back to allergy if any worsening. - montelukast (SINGULAIR) 10 MG tablet; Take 1 tablet (10 mg total) by mouth at bedtime.  Dispense: 90 tablet; Refill: 1 - CBC with Differential/Platelet; Future  2. Mild intermittent asthma, unspecified whether complicated Just prn albuterol use. Hopefully will be improved breathing with better allergy control.  - albuterol (VENTOLIN HFA) 108 (90 Base) MCG/ACT inhaler; Inhale 2 puffs into the lungs every 6 (six) hours as needed for up to 10 days.  Dispense: 18 g; Refill: 2   3. Vitamin D deficiency - VITAMIN D 25 Hydroxy (Vit-D Deficiency, Fractures); Future  5. Lipid screening - Lipid panel; Future  6. Screening for diabetes mellitus - Comprehensive metabolic panel; Future    Return for physical exam.   I discussed the assessment and treatment plan with the patient. The patient was provided an opportunity to ask questions and all were answered. The patient agreed with the plan and demonstrated an understanding of the instructions.   The patient was advised to call back or seek an in-person evaluation if the symptoms worsen or if the condition fails to improve as anticipated.  I provided 30 minutes of non-face-to-face time during this encounter.   Micheline Rough, MD

## 2019-07-10 ENCOUNTER — Telehealth: Payer: Self-pay | Admitting: *Deleted

## 2019-07-10 NOTE — Telephone Encounter (Signed)
Left a detailed message at the pts cell number to call for appts as below.

## 2019-07-10 NOTE — Telephone Encounter (Signed)
-----   Message from Caren Macadam, MD sent at 07/09/2019  1:34 PM EDT ----- Please set up physical in 1-2 months. She can do labwork ahead of time or at visit.

## 2019-07-18 ENCOUNTER — Other Ambulatory Visit: Payer: Self-pay

## 2019-07-18 ENCOUNTER — Ambulatory Visit: Payer: 59 | Admitting: Family Medicine

## 2019-07-18 ENCOUNTER — Encounter: Payer: Self-pay | Admitting: Family Medicine

## 2019-07-18 VITALS — BP 100/62 | HR 70 | Ht 63.0 in | Wt 134.0 lb

## 2019-07-18 DIAGNOSIS — M41125 Adolescent idiopathic scoliosis, thoracolumbar region: Secondary | ICD-10-CM

## 2019-07-18 DIAGNOSIS — M999 Biomechanical lesion, unspecified: Secondary | ICD-10-CM | POA: Diagnosis not present

## 2019-07-18 NOTE — Patient Instructions (Signed)
Happy Anniversary See me again in 5 weeks

## 2019-07-18 NOTE — Assessment & Plan Note (Signed)
Decision today to treat with OMT was based on Physical Exam  After verbal consent patient was treated with HVLA, ME, FPR techniques in , thoracic, lumbar and sacral areas  Patient tolerated the procedure well with improvement in symptoms  Patient given exercises, stretches and lifestyle modifications  See medications in patient instructions if given  Patient will follow up in 4-8 weeks

## 2019-07-18 NOTE — Assessment & Plan Note (Signed)
Stable overall.  Making significant progress.  Patient seems to be a little better place overall.  Discussed with patient icing regimen and home exercises.  Patient will increase activity as tolerated.  Follow-up again in 4 to 8 weeks

## 2019-07-18 NOTE — Progress Notes (Signed)
[facility] [place], [place] [address] Phone: [phone] Subjective:   I [name] am serving as a Education administrator for Dr. Hulan Saas.    CC: Back pain follow-up  NOM:[id number]  Madison [name] is a 33 y.o. female coming in with complaint of back pain. States that she is doing better but sleeping on the right side is painful. Good and bad days but pain is no where near as bad as before.  Patient has been making progress.  Doing a new workout routine which he thinks is beneficial.  Patient denies any numbness or tingling.     Past Medical History:  Diagnosis Date  . Allergy   . Asthma   . Hx of migraines 01/012003   can tell when they come on, treats with excedrin migraine.  . Scoliosis 10/24/1995  . Vitamin D deficiency    Past Surgical History:  Procedure Laterality Date  . partial spinal fusion C7-T2 due to scoliosis 03/1998 N/A 04/11/1998  . TONSILLECTOMY    . WISDOM TOOTH EXTRACTION     Social History   Socioeconomic History  . Marital status: Married    Spouse name: Not on file  . Number of children: 0  . Years of education: Not on file  . Highest education level: Not on file  Occupational History  . Not on file  Social Needs  . Financial resource strain: Not on file  . Food insecurity    Worry: Not on file    Inability: Not on file  . Transportation needs    Medical: Not on file    Non-medical: Not on file  Tobacco Use  . Smoking status: Never Smoker  . Smokeless tobacco: Never Used  Substance and Sexual Activity  . Alcohol use: Yes    Comment: occasionally  . Drug use: No  . Sexual activity: Yes    Partners: Male    Birth control/protection: I.U.D.    Comment: skyla  Lifestyle  . Physical activity    Days per week: Not on file    Minutes per session: Not on file  . Stress: Not on file  Relationships  . Social Herbalist on phone: Not on file    Gets together: Not on file    Attends  religious service: Not on file    Active member of club or organization: Not on file    Attends meetings of clubs or organizations: Not on file    Relationship status: Not on file  Other Topics Concern  . Not on file  Social History Narrative  . Not on file   Allergies  Allergen Reactions  . Prednisone     Tingling in hands and face  . Meloxicam     heartburn   Family History  Problem Relation Age of Onset  . Breast cancer Maternal Grandmother 61  . Osteoporosis Maternal Grandmother   . Heart failure Maternal Grandmother   . Healthy Mother   . Hearing loss Father   . Other Father        prostate level elevation  . Diabetes Mellitus II Brother        TWIN  . Prostate cancer Maternal Grandfather   . Heart disease Maternal Grandfather   . Heart failure Maternal Grandfather   . Brain cancer Paternal Grandmother   . Melanoma Paternal Grandfather   . Colon cancer Paternal Aunt     Current Outpatient Medications (Endocrine & Metabolic):  .  Norethindrone-Ethinyl Estradiol-Fe Biphas (LO LOESTRIN FE) 1 MG-10 MCG / 10 MCG tablet, Take 1 tablet by mouth daily.   Current Outpatient Medications (Respiratory):  .  albuterol (VENTOLIN HFA) 108 (90 Base) MCG/ACT inhaler, Inhale 2 puffs into the lungs every 6 (six) hours as needed for up to 10 days. .  fexofenadine (ALLEGRA) 30 MG tablet, Take 30 mg by mouth daily.  .  montelukast (SINGULAIR) 10 MG tablet, Take 1 tablet (10 mg total) by mouth at bedtime.    Current Outpatient Medications (Other):  .  Spacer/Aero-Holding Chambers (AEROCHAMBER PLUS WITH MASK) inhaler, Use as directed.    Past medical history, social, surgical and family history all reviewed in electronic medical record.  No pertanent information unless stated regarding to the chief complaint.   Review of Systems:  No headache, visual changes, nausea, vomiting, diarrhea, constipation, dizziness, abdominal pain, skin rash, fevers, chills, night sweats, weight loss,  swollen lymph nodes, body aches, joint swelling, , chest pain, shortness of breath, mood changes.  Positive muscle aches  Objective  Blood pressure 100/62, pulse 70, height 5' 3"  (1.6 m), weight 134 lb (60.8 kg), SpO2 98 %.    General: No apparent distress alert and oriented x3 mood and affect normal, dressed appropriately.  HEENT: Pupils equal, extraocular movements intact  Respiratory: Patient's speak in full sentences and does not appear short of breath  Cardiovascular: No lower extremity edema, non tender, no erythema  Skin: Warm dry intact with no signs of infection or rash on extremities or on axial skeleton.  Abdomen: Soft nontender  Neuro: Cranial nerves II through XII are intact, neurovascularly intact in all extremities with 2+ DTRs and 2+ pulses.  Lymph: No lymphadenopathy of posterior or anterior cervical chain or axillae bilaterally.  Gait normal with good balance and coordination.  MSK:  Non tender with full range of motion and good stability and symmetric strength and tone of shoulders, elbows, wrist, hip, knee and ankles bilaterally.  Severe scoliosis of the back noted.  Continues to have tightness of the paraspinal musculature more on the left lumbar spine as well as the right sacroiliac joint.  Mild positive Corky Sox on the left side.  Negative straight leg test on the left.  Mild tenderness in the paraspinal musculature lumbar spine  Osteopathic findings C2 flexed rotated and side bent left T3 extended rotated and side bent right with improved curve from T3-T7  L1 flexed rotated and side bent left  Sacrum right on right      Impression and Recommendations:     This case required medical decision making of moderate complexity. The above documentation has been reviewed and is accurate and complete Lyndal Pulley, DO       Note: This dictation was prepared with Dragon dictation along with smaller phrase technology. Any transcriptional errors that result from this  process are unintentional.

## 2019-08-05 MED FILL — LO LOESTRIN FE 1-10 TABLET: 1 MG-10 MCG | 84 days supply | Qty: 84 | Fill #2

## 2019-08-15 ENCOUNTER — Encounter: Payer: Self-pay | Admitting: Family Medicine

## 2019-08-16 NOTE — Progress Notes (Signed)
[name] [place], [place] [address] Phone: [phone] Subjective:   I Madison [name] am serving as a Education administrator for Dr. Hulan Saas.   CC:  Back pain   [id number]  Madison [name] is a 33 y.o. female coming in with complaint of back pain. Patient states she is doing well.  Patient has been doing relatively well overall.  Still some tightness.  Has been able to make some significant changes though.  Patient states that if she does certain stretches seems to be more beneficial.     Past Medical History:  Diagnosis Date   Allergy    Asthma    Hx of migraines 01/012003   can tell when they come on, treats with excedrin migraine.   Scoliosis 10/24/1995   Vitamin D deficiency    Past Surgical History:  Procedure Laterality Date   partial spinal fusion C7-T2 due to scoliosis 03/1998 N/A 04/11/1998   TONSILLECTOMY     WISDOM TOOTH EXTRACTION     Social History   Socioeconomic History   Marital status: Married    Spouse name: Not on file   Number of children: 0   Years of education: Not on file   Highest education level: Not on file  Occupational History   Not on file  Social Needs   Financial resource strain: Not on file   Food insecurity    Worry: Not on file    Inability: Not on file   Transportation needs    Medical: Not on file    Non-medical: Not on file  Tobacco Use   Smoking status: Never Smoker   Smokeless tobacco: Never Used  Substance and Sexual Activity   Alcohol use: Yes    Comment: occasionally   Drug use: No   Sexual activity: Yes    Partners: Male    Birth control/protection: I.U.D.    Comment: skyla  Lifestyle   Physical activity    Days per week: Not on file    Minutes per session: Not on file   Stress: Not on file  Relationships   Social connections    Talks on phone: Not on file    Gets together: Not on file    Attends religious service: Not on file    Active  member of club or organization: Not on file    Attends meetings of clubs or organizations: Not on file    Relationship status: Not on file  Other Topics Concern   Not on file  Social History Narrative   Not on file   Allergies  Allergen Reactions   Prednisone     Tingling in hands and face   Meloxicam     heartburn   Family History  Problem Relation Age of Onset   Breast cancer Maternal Grandmother 28   Osteoporosis Maternal Grandmother    Heart failure Maternal Grandmother    Healthy Mother    Hearing loss Father    Other Father        prostate level elevation   Diabetes Mellitus II Brother        TWIN   Prostate cancer Maternal Grandfather    Heart disease Maternal Grandfather    Heart failure Maternal Grandfather    Brain cancer Paternal Grandmother    Melanoma Paternal Grandfather    Colon cancer Paternal Aunt     Current Outpatient Medications (Endocrine & Metabolic):    Norethindrone-Ethinyl Estradiol-Fe Biphas (LO LOESTRIN FE) 1 MG-10  MCG / 10 MCG tablet, Take 1 tablet by mouth daily.   Current Outpatient Medications (Respiratory):    fexofenadine (ALLEGRA) 30 MG tablet, Take 30 mg by mouth daily.    montelukast (SINGULAIR) 10 MG tablet, Take 1 tablet (10 mg total) by mouth at bedtime.   albuterol (VENTOLIN HFA) 108 (90 Base) MCG/ACT inhaler, Inhale 2 puffs into the lungs every 6 (six) hours as needed for up to 10 days.    Current Outpatient Medications (Other):    Spacer/Aero-Holding Chambers (AEROCHAMBER PLUS WITH MASK) inhaler, Use as directed.    Past medical history, social, surgical and family history all reviewed in electronic medical record.  No pertanent information unless stated regarding to the chief complaint.   Review of Systems:  No headache, visual changes, nausea, vomiting, diarrhea, constipation, dizziness, abdominal pain, skin rash, fevers, chills, night sweats, weight loss, swollen lymph nodes, body aches, joint  swelling,  chest pain, shortness of breath, mood changes.  Positive muscle aches  Objective  Blood pressure 100/62, pulse 77, height 5' 3"  (1.6 m), SpO2 99 %.    General: No apparent distress alert and oriented x3 mood and affect normal, dressed appropriately.  HEENT: Pupils equal, extraocular movements intact  Respiratory: Patient's speak in full sentences and does not appear short of breath  Cardiovascular: No lower extremity edema, non tender, no erythema  Skin: Warm dry intact with no signs of infection or rash on extremities or on axial skeleton.  Abdomen: Soft nontender  Neuro: Cranial nerves II through XII are intact, neurovascularly intact in all extremities with 2+ DTRs and 2+ pulses.  Lymph: No lymphadenopathy of posterior or anterior cervical chain or axillae bilaterally.  Gait normal with good balance and coordination.  MSK:  Non tender with full range of motion and good stability and symmetric strength and tone of shoulders, elbows, wrist, hip, knee and ankles bilaterally.  Low back exam does show that patient does have some scoliosis and still noted.  No significant change when test.  Patient still has tenderness to palpation in paraspinal musculature in the lumbar spine.  Seems to be more diffuse than previous.  Tightness with Corky Sox.  Hypermobility still noted.  Negative straight leg test.  Osteopathic findings ]  T9 extended rotated and side bent left L2 flexed rotated and side bent right Sacrum right on right    Impression and Recommendations:     This case required medical decision making of moderate complexity. The above documentation has been reviewed and is accurate and complete Lyndal Pulley, DO       Note: This dictation was prepared with Dragon dictation along with smaller phrase technology. Any transcriptional errors that result from this process are unintentional.

## 2019-08-18 ENCOUNTER — Encounter: Payer: Self-pay | Admitting: Family Medicine

## 2019-08-18 ENCOUNTER — Ambulatory Visit (INDEPENDENT_AMBULATORY_CARE_PROVIDER_SITE_OTHER): Payer: 59 | Admitting: Family Medicine

## 2019-08-18 VITALS — BP 100/62 | HR 77 | Ht 63.0 in

## 2019-08-18 DIAGNOSIS — M999 Biomechanical lesion, unspecified: Secondary | ICD-10-CM | POA: Diagnosis not present

## 2019-08-18 DIAGNOSIS — M41125 Adolescent idiopathic scoliosis, thoracolumbar region: Secondary | ICD-10-CM | POA: Diagnosis not present

## 2019-08-18 NOTE — Assessment & Plan Note (Signed)
Decision today to treat with OMT was based on Physical Exam  After verbal consent patient was treated with HVLA, ME, FPR techniques in  thoracic, lumbar and sacral areas  Patient tolerated the procedure well with improvement in symptoms  Patient given exercises, stretches and lifestyle modifications  See medications in patient instructions if given  Patient will follow up in 4-8 weeks

## 2019-08-18 NOTE — Assessment & Plan Note (Signed)
Stable, discussed HEP  Discussed which activity  RTC in 4-6 weeks

## 2019-08-18 NOTE — Patient Instructions (Signed)
Good to see you Doing great Keep stretching See me again in 5-6 weeks

## 2019-09-24 ENCOUNTER — Encounter: Payer: 59 | Admitting: Family Medicine

## 2019-09-24 IMAGING — DX DG LUMBAR SPINE 2-3V
3 series · 3 of 3 positions shown · non-contrast
Comparison: No prior.

CLINICAL DATA: Chronic low back pain.

EXAM:
LUMBAR SPINE - 2-3 VIEW

[l-spine ap]
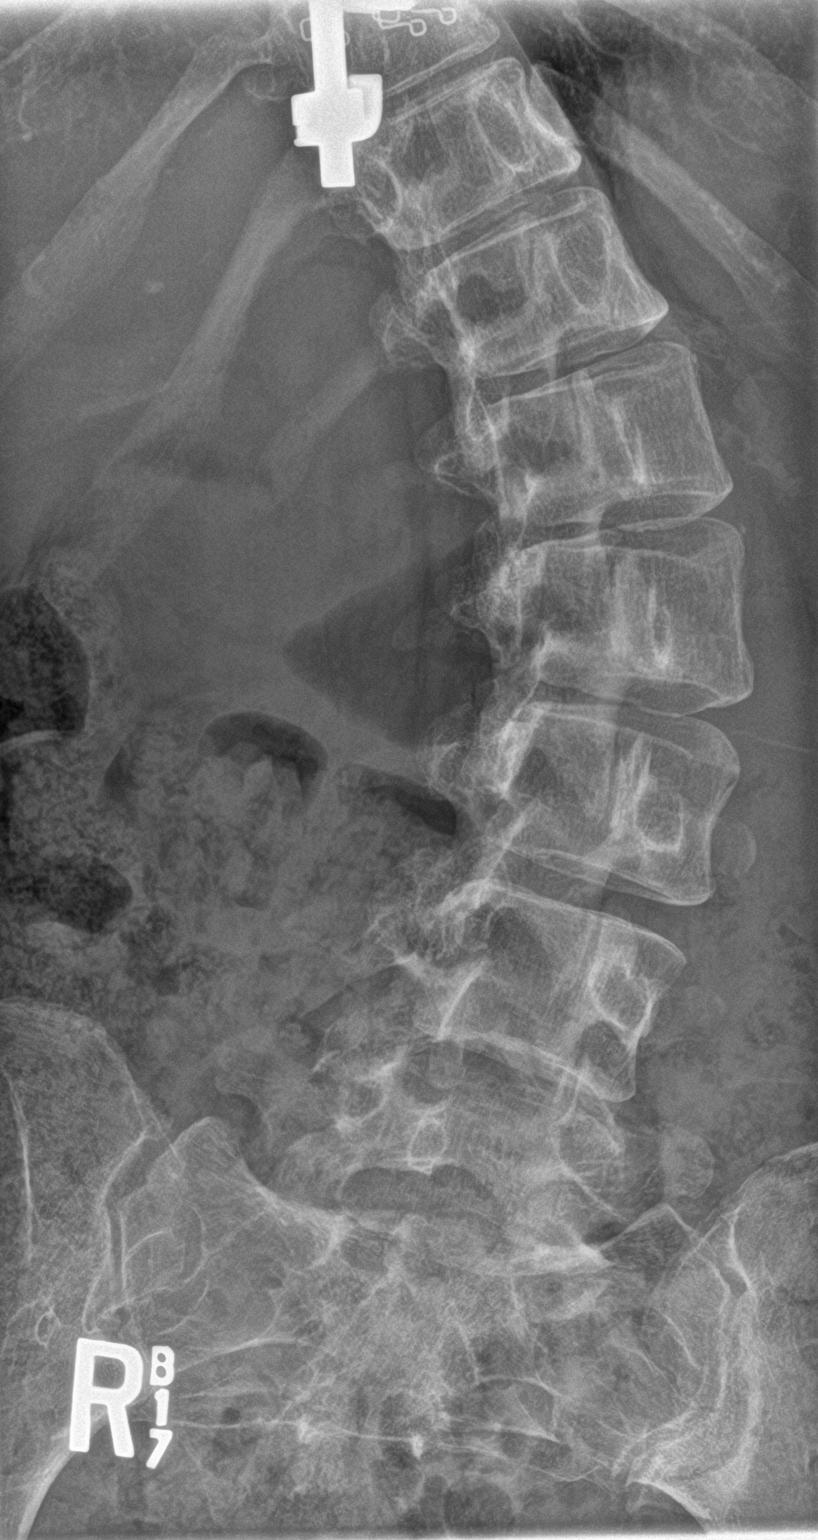

[l-spine lat]
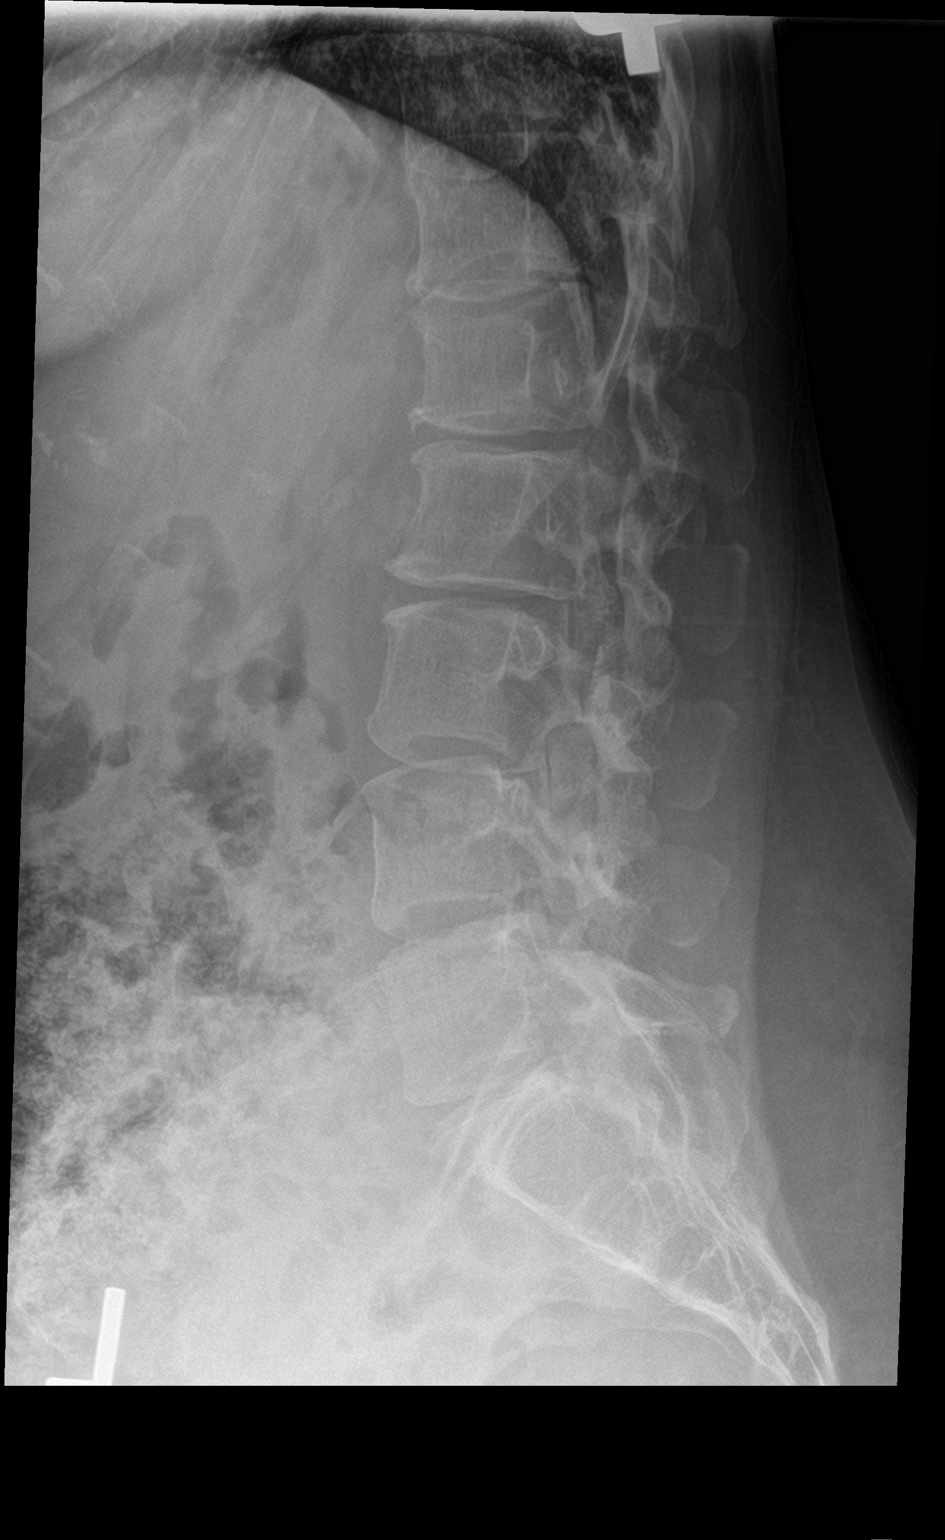

[l-spine spot]
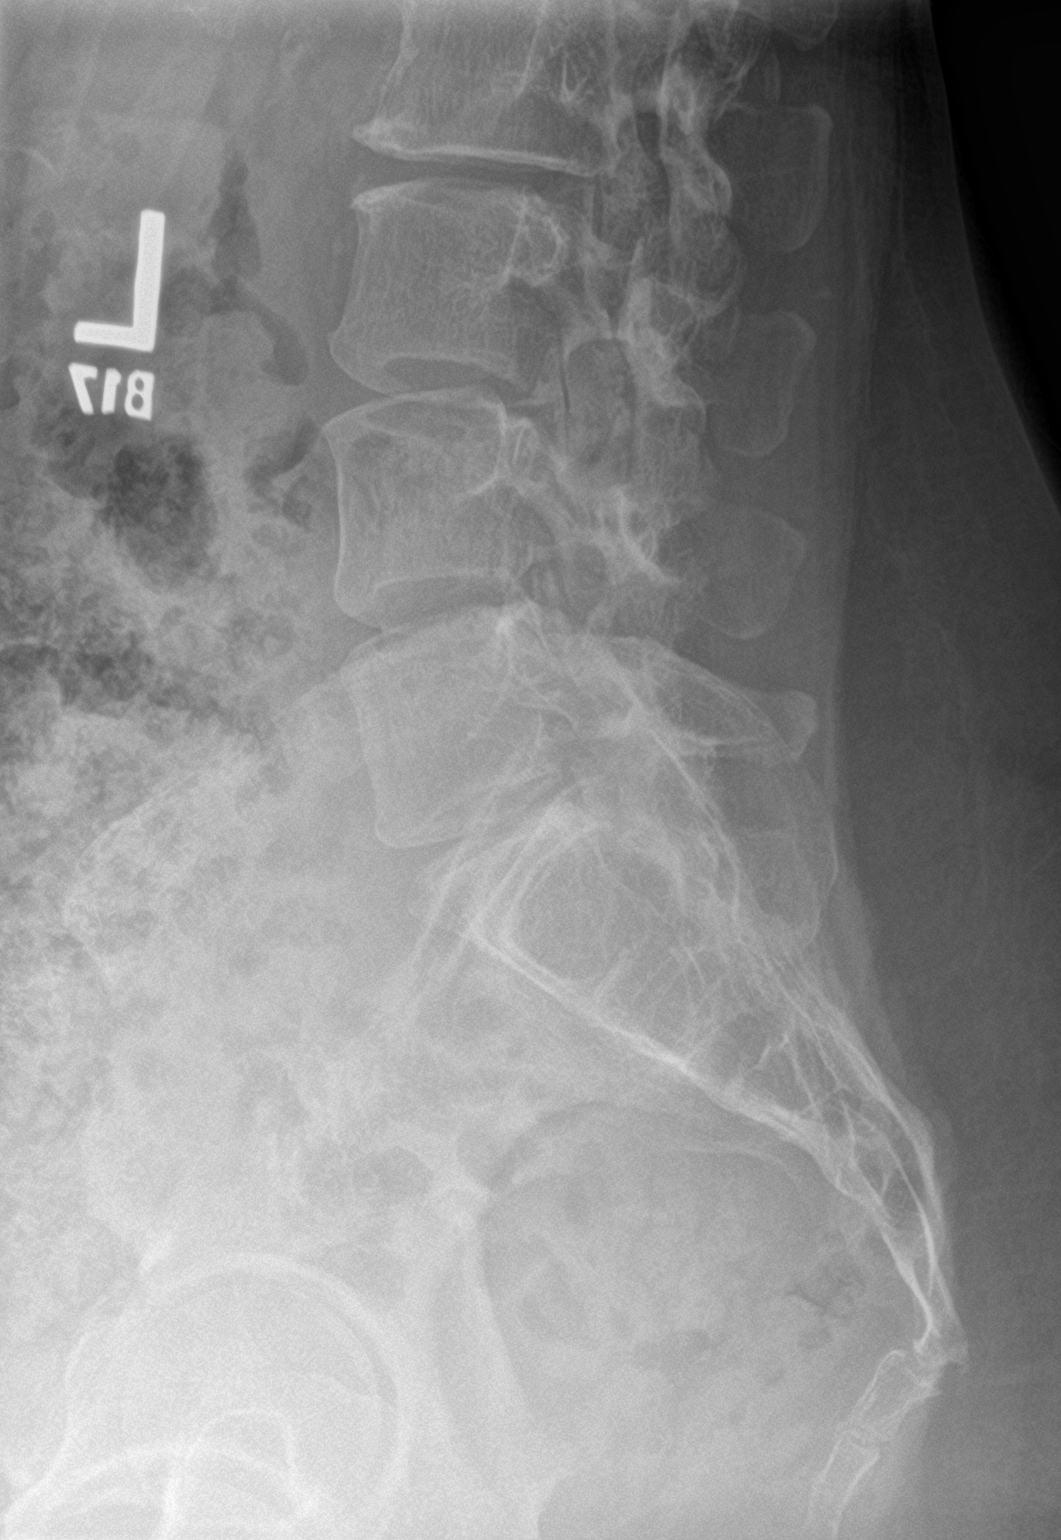

[3 of 3 positions shown; findings below may reference images not displayed]

FINDINGS: Severe lumbar spine scoliosis concave right. Diffuse degenerative
change. No acute bony abnormality identified. Stool noted throughout
the colon.
IMPRESSION: 1. Severe lumbar spine scoliosis concave right. Diffuse degenerative
change. No acute bony abnormality.

2. Stool noted throughout the colon. Constipation could present this
fashion.

## 2019-09-30 ENCOUNTER — Ambulatory Visit (INDEPENDENT_AMBULATORY_CARE_PROVIDER_SITE_OTHER): Payer: 59 | Admitting: Family Medicine

## 2019-09-30 ENCOUNTER — Encounter: Payer: Self-pay | Admitting: Family Medicine

## 2019-09-30 VITALS — BP 108/76 | HR 59 | Ht 63.0 in | Wt 134.0 lb

## 2019-09-30 DIAGNOSIS — M41125 Adolescent idiopathic scoliosis, thoracolumbar region: Secondary | ICD-10-CM

## 2019-09-30 DIAGNOSIS — M999 Biomechanical lesion, unspecified: Secondary | ICD-10-CM

## 2019-09-30 NOTE — Patient Instructions (Addendum)
  [address] 1st floor [place], [place] [address] Phone [phone]  [place] See me again in 2 months

## 2019-09-30 NOTE — Progress Notes (Signed)
[facility] [place], [place] [address] Phone: [phone] Subjective:   Madison [name], am serving as a scribe for Dr. Hulan Saas.  This visit occurred during the SARS-CoV-2 public health emergency.  Safety protocols were in place, including screening questions prior to the visit, additional usage of staff PPE, and extensive cleaning of exam room while observing appropriate contact time as indicated for disinfecting solutions.     CC: Back pain, neck pain follow-up  XLK:[id number]   08/18/2019 OMT  09/30/2019 Madison [name] is a 33 y.o. female coming in with complaint of back pain. [name] new issues since last visit.  Patient has scoliosis.  Has been doing relatively well though with conservative therapy.  Patient denies any numbness or tingling, denies any radiation tingling to extremities.  Nothing that is stopping from activity.     Past Medical History:  Diagnosis Date  . Allergy   . Asthma   . Hx of migraines 01/012003   can tell when they come on, treats with excedrin migraine.  . Scoliosis 10/24/1995  . Vitamin D deficiency    Past Surgical History:  Procedure Laterality Date  . partial spinal fusion C7-T2 due to scoliosis 03/1998 N/A 04/11/1998  . TONSILLECTOMY    . WISDOM TOOTH EXTRACTION     Social History   Socioeconomic History  . Marital status: Married    Spouse name: Not on file  . Number of children: 0  . Years of education: Not on file  . Highest education level: Not on file  Occupational History  . Not on file  Social Needs  . Financial resource strain: Not on file  . Food insecurity    Worry: Not on file    Inability: Not on file  . Transportation needs    Medical: Not on file    Non-medical: Not on file  Tobacco Use  . Smoking status: Never Smoker  . Smokeless tobacco: Never Used  Substance and Sexual Activity  . Alcohol use: Yes    Comment: occasionally  . Drug use: [name]  . Sexual activity:  Yes    Partners: Male    Birth control/protection: I.U.D.    Comment: skyla  Lifestyle  . Physical activity    Days per week: Not on file    Minutes per session: Not on file  . Stress: Not on file  Relationships  . Social Herbalist on phone: Not on file    Gets together: Not on file    Attends religious service: Not on file    Active member of club or organization: Not on file    Attends meetings of clubs or organizations: Not on file    Relationship status: Not on file  Other Topics Concern  . Not on file  Social History Narrative  . Not on file   Allergies  Allergen Reactions  . Prednisone     Tingling in hands and face  . Meloxicam     heartburn   Family History  Problem Relation Age of Onset  . Breast cancer Maternal Grandmother 48  . Osteoporosis Maternal Grandmother   . Heart failure Maternal Grandmother   . Healthy Mother   . Hearing loss Father   . Other Father        prostate level elevation  . Diabetes Mellitus II Brother        TWIN  . Prostate cancer Maternal Grandfather   . Heart disease  Maternal Grandfather   . Heart failure Maternal Grandfather   . Brain cancer Paternal Grandmother   . Melanoma Paternal Grandfather   . Colon cancer Paternal Aunt     Current Outpatient Medications (Endocrine & Metabolic):  Marland Kitchen  Norethindrone-Ethinyl Estradiol-Fe Biphas (LO LOESTRIN FE) 1 MG-10 MCG / 10 MCG tablet, Take 1 tablet by mouth daily.   Current Outpatient Medications (Respiratory):  .  albuterol (VENTOLIN HFA) 108 (90 Base) MCG/ACT inhaler, Inhale 2 puffs into the lungs every 6 (six) hours as needed for up to 10 days. .  fexofenadine (ALLEGRA) 30 MG tablet, Take 30 mg by mouth daily.  .  montelukast (SINGULAIR) 10 MG tablet, Take 1 tablet (10 mg total) by mouth at bedtime.    Current Outpatient Medications (Other):  .  Spacer/Aero-Holding Chambers (AEROCHAMBER PLUS WITH MASK) inhaler, Use as directed.    Past medical history, social,  surgical and family history all reviewed in electronic medical record.  [name] pertanent information unless stated regarding to the chief complaint.   Review of Systems:  [name] headache, visual changes, nausea, vomiting, diarrhea, constipation, dizziness, abdominal pain, skin rash, fevers, chills, night sweats, weight loss, swollen lymph nodes, body aches, joint swelling, muscle aches, chest pain, shortness of breath, mood changes.   Objective  There were [name] vitals taken for this visit. Systems examined below as of    General: [name] apparent distress alert and oriented x3 mood and affect normal, dressed appropriately.  HEENT: Pupils equal, extraocular movements intact  Respiratory: Patient's speak in full sentences and does not appear short of breath  Cardiovascular: [name] lower extremity edema, non tender, [name] erythema  Skin: Warm dry intact with [name] signs of infection or rash on extremities or on axial skeleton.  Abdomen: Soft nontender  Neuro: Cranial nerves II through XII are intact, neurovascularly intact in all extremities with 2+ DTRs and 2+ pulses.  Lymph: [name] lymphadenopathy of posterior or anterior cervical chain or axillae bilaterally.  Gait normal with good balance and coordination.  MSK:  Non tender with full range of motion and good stability and symmetric strength and tone of shoulders, elbows, wrist, hip, knee and ankles bilaterally.  Back exam shows the patient does have severe scoliosis in the thoracic and lumbar areas.  Significant tightness noted in the parascapular region on the right side in the lumbar paraspinal musculature on the left side.  Mild positive Corky Sox on the left side.  Osteopathic findings  T1-T4 neutral side bent left rotated right T9 extended rotated and side bent left L2 flexed rotated and side bent right Sacrum left on left    Impression and Recommendations:     This case required medical decision making of moderate complexity. The above documentation has been  reviewed and is accurate and complete Lyndal Pulley, DO       Note: This dictation was prepared with Dragon dictation along with smaller phrase technology. Any transcriptional errors that result from this process are unintentional.

## 2019-09-30 NOTE — Assessment & Plan Note (Signed)
Decision today to treat with OMT was based on Physical Exam  After verbal consent patient was treated with HVLA, ME, FPR techniques in , thoracic, lumbar and sacral areas  Patient tolerated the procedure well with improvement in symptoms  Patient given exercises, stretches and lifestyle modifications  See medications in patient instructions if given  Patient will follow up in 8 weeks

## 2019-09-30 NOTE — Assessment & Plan Note (Signed)
Severe overall the patient is doing very well with conservative therapy.  Discussed with patient icing regimen, home exercises, which activities to do which wants to avoid.  Patient will follow up with me again in 71-monthinterval.

## 2019-10-21 MED FILL — MONTELUKAST SOD 10 MG TAB: 10 | 90 days supply | Qty: 90 | Fill #1

## 2019-10-21 MED FILL — LO LOESTRIN FE 1-10 TABLET: 1 MG-10 MCG | 84 days supply | Qty: 84 | Fill #3

## 2019-12-02 ENCOUNTER — Ambulatory Visit: Payer: 59 | Admitting: Family Medicine

## 2019-12-02 ENCOUNTER — Other Ambulatory Visit: Payer: Self-pay

## 2019-12-02 ENCOUNTER — Encounter: Payer: Self-pay | Admitting: Family Medicine

## 2019-12-02 VITALS — BP 122/84 | HR 70 | Ht 63.0 in | Wt 137.0 lb

## 2019-12-02 DIAGNOSIS — M999 Biomechanical lesion, unspecified: Secondary | ICD-10-CM | POA: Diagnosis not present

## 2019-12-02 DIAGNOSIS — M41125 Adolescent idiopathic scoliosis, thoracolumbar region: Secondary | ICD-10-CM

## 2019-12-02 NOTE — Assessment & Plan Note (Signed)
chronic problem, mild exacerbation. Discussed medical management and medications. Discussed HEP and icing regimen. Increase activity slowly. See me again in 8 weeks

## 2019-12-02 NOTE — Patient Instructions (Signed)
Continue what you are doing See me in 8-10 weeks

## 2019-12-02 NOTE — Assessment & Plan Note (Addendum)
Decision today to treat with OMT was based on Physical Exam  After verbal consent patient was treated with HVLA, ME, FPR techniques in  thoracic, lumbar and sacral areas  Patient tolerated the procedure well with improvement in symptoms  Patient given exercises, stretches and lifestyle modifications  See medications in patient instructions if given  Patient will follow up in 4-8 weeks

## 2019-12-02 NOTE — Progress Notes (Signed)
[place] Phone: [phone] Subjective:   Fontaine No, am serving as a scribe for Dr. Hulan Saas. This visit occurred during the SARS-CoV-2 public health emergency.  Safety protocols were in place, including screening questions prior to the visit, additional usage of staff PPE, and extensive cleaning of exam room while observing appropriate contact time as indicated for disinfecting solutions.   I'm seeing this patient by the request  of:  Koberlein, Steele Berg, MD  CC: back pain follow up   [id number]  Madison [name] is a 34 y.o. female coming in with complaint of back pain. Last seen on 09/30/2019 for OMT. Patient states her neck is stiff from sitting at her desk but otherwise is feeling good. No reoccurrence of low back pain since last visit.      COVID vaccination dates 11/06/2019 11/27/2019 Past Medical History:  Diagnosis Date  . Allergy   . Asthma   . Hx of migraines 01/012003   can tell when they come on, treats with excedrin migraine.  . Scoliosis 10/24/1995  . Vitamin D deficiency    Past Surgical History:  Procedure Laterality Date  . partial spinal fusion C7-T2 due to scoliosis 03/1998 N/A 04/11/1998  . TONSILLECTOMY    . WISDOM TOOTH EXTRACTION     Social History   Socioeconomic History  . Marital status: Married    Spouse name: Not on file  . Number of children: 0  . Years of education: Not on file  . Highest education level: Not on file  Occupational History  . Not on file  Tobacco Use  . Smoking status: Never Smoker  . Smokeless tobacco: Never Used  Substance and Sexual Activity  . Alcohol use: Yes    Comment: occasionally  . Drug use: No  . Sexual activity: Yes    Partners: Male    Birth control/protection: I.U.D.    Comment: skyla  Other Topics Concern  . Not on file  Social History Narrative  . Not on file   Social Determinants of Health   Financial  Resource Strain:   . Difficulty of Paying Living Expenses: Not on file  Food Insecurity:   . Worried About Charity fundraiser in the Last Year: Not on file  . Ran Out of Food in the Last Year: Not on file  Transportation Needs:   . Lack of Transportation (Medical): Not on file  . Lack of Transportation (Non-Medical): Not on file  Physical Activity:   . Days of Exercise per Week: Not on file  . Minutes of Exercise per Session: Not on file  Stress:   . Feeling of Stress : Not on file  Social Connections:   . Frequency of Communication with Friends and Family: Not on file  . Frequency of Social Gatherings with Friends and Family: Not on file  . Attends Religious Services: Not on file  . Active Member of Clubs or Organizations: Not on file  . Attends Archivist Meetings: Not on file  . Marital Status: Not on file   Allergies  Allergen Reactions  . Prednisone     Tingling in hands and face  . Meloxicam     heartburn   Family History  Problem Relation Age of Onset  . Breast cancer Maternal Grandmother 51  . Osteoporosis Maternal Grandmother   . Heart failure Maternal Grandmother   . Healthy Mother   . Hearing loss Father   .  Other Father        prostate level elevation  . Diabetes Mellitus II Brother        TWIN  . Prostate cancer Maternal Grandfather   . Heart disease Maternal Grandfather   . Heart failure Maternal Grandfather   . Brain cancer Paternal Grandmother   . Melanoma Paternal Grandfather   . Colon cancer Paternal Aunt     Current Outpatient Medications (Endocrine & Metabolic):  Marland Kitchen  Norethindrone-Ethinyl Estradiol-Fe Biphas (LO LOESTRIN FE) 1 MG-10 MCG / 10 MCG tablet, Take 1 tablet by mouth daily.   Current Outpatient Medications (Respiratory):  .  albuterol (VENTOLIN HFA) 108 (90 Base) MCG/ACT inhaler, Inhale 2 puffs into the lungs every 6 (six) hours as needed for up to 10 days. .  fexofenadine (ALLEGRA) 30 MG tablet, Take 30 mg by mouth daily.   .  montelukast (SINGULAIR) 10 MG tablet, Take 1 tablet (10 mg total) by mouth at bedtime.    Current Outpatient Medications (Other):  .  Spacer/Aero-Holding Chambers (AEROCHAMBER PLUS WITH MASK) inhaler, Use as directed.   Reviewed prior external information including notes and imaging from  primary care provider As well as notes that were available from care everywhere and other healthcare systems.  Past medical history, social, surgical and family history all reviewed in electronic medical record.  No pertanent information unless stated regarding to the chief complaint.   Review of Systems:  No headache, visual changes, nausea, vomiting, diarrhea, constipation, dizziness, abdominal pain, skin rash, fevers, chills, night sweats, weight loss, swollen lymph nodes, body aches, joint swelling, chest pain, shortness of breath, mood changes. POSITIVE muscle aches  Objective  There were no vitals taken for this visit.   General: No apparent distress alert and oriented x3 mood and affect normal, dressed appropriately.  HEENT: Pupils equal, extraocular movements intact  Respiratory: Patient's speak in full sentences and does not appear short of breath  Cardiovascular: No lower extremity edema, non tender, no erythema  Skin: Warm dry intact with no signs of infection or rash on extremities or on axial skeleton.  Abdomen: Soft nontender  Neuro: Cranial nerves II through XII are intact, neurovascularly intact in all extremities with 2+ DTRs and 2+ pulses.  Lymph: No lymphadenopathy of posterior or anterior cervical chain or axillae bilaterally.  Gait normal with good balance and coordination.  MSK:  Non tender with full range of motion and good stability and symmetric strength and tone of shoulders, elbows, wrist, hip, knee and ankles bilaterally.   Advanced scoliosis thoracic and lumbar spine.TTP paraspinal musculature more in pain in right side of SI joint   Osteopathic findings T1-4  Neutral side bent and rotated right  T8 extended rotated and side bent left  L2 Flexed rotated and side bent right  Sacrum left on left    Impression and Recommendations:     This case required medical decision making of moderate complexity. The above documentation has been reviewed and is accurate and complete Lyndal Pulley, DO       Note: This dictation was prepared with Dragon dictation along with smaller phrase technology. Any transcriptional errors that result from this process are unintentional.

## 2019-12-17 DIAGNOSIS — H5203 Hypermetropia, bilateral: Secondary | ICD-10-CM | POA: Diagnosis not present

## 2019-12-17 DIAGNOSIS — H52223 Regular astigmatism, bilateral: Secondary | ICD-10-CM | POA: Diagnosis not present

## 2020-01-01 ENCOUNTER — Other Ambulatory Visit: Payer: Self-pay

## 2020-01-01 ENCOUNTER — Ambulatory Visit: Payer: 59 | Admitting: Family Medicine

## 2020-01-01 ENCOUNTER — Encounter: Payer: Self-pay | Admitting: Family Medicine

## 2020-01-01 VITALS — BP 118/80 | HR 92 | Ht 63.0 in | Wt 136.0 lb

## 2020-01-01 DIAGNOSIS — M41125 Adolescent idiopathic scoliosis, thoracolumbar region: Secondary | ICD-10-CM

## 2020-01-01 DIAGNOSIS — M62838 Other muscle spasm: Secondary | ICD-10-CM | POA: Diagnosis not present

## 2020-01-01 MED ORDER — CYCLOBENZAPRINE HCL 5 MG PO TABS: 5.0000 mg | ORAL_TABLET | Freq: Every evening | ORAL | 1 refills | Status: DC | PRN

## 2020-01-01 MED FILL — CYCLOBENZAPRINE HCL 5 MG TA: 5 | 15 days supply | Qty: 30 | Fill #0

## 2020-01-01 NOTE — Progress Notes (Signed)
   I, Wendy Poet, LAT, ATC, am serving as scribe for Dr. Lynne Leader.  Madison [name] is a 34 y.o. female who presents to [place] [facility] today for neck and shoulder pain.  She was last seen by Dr. Tamala Julian on 12/02/19 for OMT of her neck and has seen Dr. Tamala Julian several times for OMT.  Since her last visit, pt reports having neck pain that started Saturday evening as a pinching sensation rates the pain 6/10 aching discomfort and sometimes numbness that radiates down the back of the L shoulder. Patient does have a history of severe scoliosis and a thoracolumbar fusion. Has tried ibuprofen, ice and heat therapy and a massage gun. Laying down hurts worse and lots of neck movements cause the pain to be more severe.    Pertinent review of systems: No fevers or chills.  No radiculopathy.  Relevant historical information: History thoracolumbar scoliosis status post fusion at 34 years old   Exam:  BP 118/80 (BP Location: Left Arm, Patient Position: Sitting, Cuff Size: Normal)   Pulse 92   Ht 5' 3"  (1.6 m)   Wt 136 lb (61.7 kg)   SpO2 99%   BMI 24.09 kg/m  General: Well Developed, well nourished, and in no acute distress.   MSK: C-spine: Normal-appearing Nontender spinal midline. Tender palpation left cervical paraspinal musculature and left trapezius. Normal cervical motion. Upper extremity strength reflexes and sensation are intact and equal bilaterally. T-spine: Scoliosis curvature convex right.  Right shoulder sits higher than left. Nontender. L-spine: Nontender. Lower extremity reflexes and sensation are equal normal throughout.      Assessment and Plan: 34 y.o. female with left cervical paraspinal muscular spasm and dysfunction.  Patient also has trapezius spasm and dysfunction.  Plan for home exercise program as taught in clinic today by ATC.  We will proceed with trial of conservative management including heat TENS unit NSAIDs cyclobenzaprine and  referral to physical therapy.  Patient has a existing appointment scheduled with my partner Dr. Tamala Julian in April.  That would be good opportunity to follow-up this issue as well.  [id number]; 15 additional minutes spent for Therapeutic exercises as stated in above notes.  This included exercises focusing on stretching, strengthening, with significant focus on eccentric aspects.   Long term goals include an improvement in range of motion, strength, endurance as well as avoiding reinjury. Patient's frequency would include in 1-2 times a day, 3-5 times a week for a duration of 6-12 weeks.  Proper technique shown and discussed handout in great detail with ATC.  All questions were discussed and answered.    Orders Placed This Encounter  Procedures  . Ambulatory referral to Physical Therapy    Referral Priority:   Routine    Referral Type:   Physical Medicine    Referral Reason:   Specialty Services Required    Requested Specialty:   Physical Therapy   Meds ordered this encounter  Medications  . cyclobenzaprine (FLEXERIL) 5 MG tablet    Sig: Take 1-2 tablets (5-10 mg total) by mouth at bedtime as needed for muscle spasms.    Dispense:  30 tablet    Refill:  1     Discussed warning signs or symptoms. Please see discharge instructions. Patient expresses understanding.   The above documentation has been reviewed and is accurate and complete Lynne Leader

## 2020-01-01 NOTE — Patient Instructions (Addendum)
Thank you for coming in today. Attend PT.  Use heating pad and TENS unit.  Use muscle relaxer as needed.  Recheck as scheduled with Dr Tamala Julian.  Keep me updated if you are not doing well.  Come back or go to the emergency room if you notice new weakness new numbness problems walking or bowel or bladder problems.  Please perform the exercise program that we have prepared for you and gone over in detail on a daily basis.  In addition to the handout you were provided you can access your program through: [email or web address]   Your unique program code is:  [email or web address]   Cervical Strain and Sprain Rehab Ask your health care provider which exercises are safe for you. Do exercises exactly as told by your health care provider and adjust them as directed. It is normal to feel mild stretching, pulling, tightness, or discomfort as you do these exercises. Stop right away if you feel sudden pain or your pain gets worse. Do not begin these exercises until told by your health care provider. Stretching and range-of-motion exercises Cervical side bending  1. Using good posture, sit on a stable chair or stand up. 2. Without moving your shoulders, slowly tilt your left / right ear to your shoulder until you feel a stretch in the opposite side neck muscles. You should be looking straight ahead. 3. Hold for __________ seconds. 4. Repeat with the other side of your neck. Repeat __________ times. Complete this exercise __________ times a day. Cervical rotation  1. Using good posture, sit on a stable chair or stand up. 2. Slowly turn your head to the side as if you are looking over your left / right shoulder. ? Keep your eyes level with the ground. ? Stop when you feel a stretch along the side and the back of your neck. 3. Hold for __________ seconds. 4. Repeat this by turning to your other side. Repeat __________ times. Complete this exercise __________ times a day. Thoracic extension and pectoral  stretch 1. Roll a towel or a small blanket so it is about 4 inches (10 cm) in diameter. 2. Lie down on your back on a firm surface. 3. Put the towel lengthwise, under your spine in the middle of your back. It should not be under your shoulder blades. The towel should line up with your spine from your middle back to your lower back. 4. Put your hands behind your head and let your elbows fall out to your sides. 5. Hold for __________ seconds. Repeat __________ times. Complete this exercise __________ times a day. Strengthening exercises Isometric upper cervical flexion 1. Lie on your back with a thin pillow behind your head and a small rolled-up towel under your neck. 2. Gently tuck your chin toward your chest and nod your head down to look toward your feet. Do not lift your head off the pillow. 3. Hold for __________ seconds. 4. Release the tension slowly. Relax your neck muscles completely before you repeat this exercise. Repeat __________ times. Complete this exercise __________ times a day. Isometric cervical extension  1. Stand about 6 inches (15 cm) away from a wall, with your back facing the wall. 2. Place a soft object, about 6-8 inches (15-20 cm) in diameter, between the back of your head and the wall. A soft object could be a small pillow, a ball, or a folded towel. 3. Gently tilt your head back and press into the soft object. Keep your jaw and forehead relaxed. 4. Hold  for __________ seconds. 5. Release the tension slowly. Relax your neck muscles completely before you repeat this exercise. Repeat __________ times. Complete this exercise __________ times a day. Posture and body mechanics Body mechanics refers to the movements and positions of your body while you do your daily activities. Posture is part of body mechanics. Good posture and healthy body mechanics can help to relieve stress in your body's tissues and joints. Good posture means that your spine is in its natural S-curve  position (your spine is neutral), your shoulders are pulled back slightly, and your head is not tipped forward. The following are general guidelines for applying improved posture and body mechanics to your everyday activities. Sitting  1. When sitting, keep your spine neutral and keep your feet flat on the floor. Use a footrest, if necessary, and keep your thighs parallel to the floor. Avoid rounding your shoulders, and avoid tilting your head forward. 2. When working at a desk or a computer, keep your desk at a height where your hands are slightly lower than your elbows. Slide your chair under your desk so you are close enough to maintain good posture. 3. When working at a computer, place your monitor at a height where you are looking straight ahead and you do not have to tilt your head forward or downward to look at the screen. Standing   When standing, keep your spine neutral and keep your feet about hip-width apart. Keep a slight bend in your knees. Your ears, shoulders, and hips should line up.  When you do a task in which you stand in one place for a long time, place one foot up on a stable object that is 2-4 inches (5-10 cm) high, such as a footstool. This helps keep your spine neutral. Resting When lying down and resting, avoid positions that are most painful for you. Try to support your neck in a neutral position. You can use a contour pillow or a small rolled-up towel. Your pillow should support your neck but not push on it. This information is not intended to replace advice given to you by your health care provider. Make sure you discuss any questions you have with your health care provider. Document Revised: 01/29/2019 Document Reviewed: 07/10/2018 Elsevier Patient Education  [employer].

## 2020-01-06 MED FILL — LO LOESTRIN FE 1-10 TABLET: 1 MG-10 MCG | 84 days supply | Qty: 84 | Fill #0

## 2020-01-20 DIAGNOSIS — Z01419 Encounter for gynecological examination (general) (routine) without abnormal findings: Secondary | ICD-10-CM | POA: Diagnosis not present

## 2020-01-20 DIAGNOSIS — A630 Anogenital (venereal) warts: Secondary | ICD-10-CM | POA: Diagnosis not present

## 2020-01-20 DIAGNOSIS — Z3169 Encounter for other general counseling and advice on procreation: Secondary | ICD-10-CM | POA: Diagnosis not present

## 2020-01-20 DIAGNOSIS — Z309 Encounter for contraceptive management, unspecified: Secondary | ICD-10-CM | POA: Diagnosis not present

## 2020-01-21 ENCOUNTER — Ambulatory Visit: Payer: 59 | Admitting: Physical Therapy

## 2020-01-22 ENCOUNTER — Encounter: Payer: Self-pay | Admitting: Family Medicine

## 2020-01-27 ENCOUNTER — Other Ambulatory Visit: Payer: Self-pay

## 2020-01-28 ENCOUNTER — Other Ambulatory Visit: Payer: 59

## 2020-01-30 ENCOUNTER — Other Ambulatory Visit: Payer: Self-pay

## 2020-02-02 ENCOUNTER — Other Ambulatory Visit: Payer: Self-pay

## 2020-02-02 ENCOUNTER — Ambulatory Visit (INDEPENDENT_AMBULATORY_CARE_PROVIDER_SITE_OTHER): Payer: 59 | Admitting: Family Medicine

## 2020-02-02 ENCOUNTER — Encounter: Payer: Self-pay | Admitting: Family Medicine

## 2020-02-02 VITALS — BP 98/70 | HR 74 | Temp 98.1°F | Ht 62.75 in | Wt 135.4 lb

## 2020-02-02 DIAGNOSIS — T7840XA Allergy, unspecified, initial encounter: Secondary | ICD-10-CM | POA: Diagnosis not present

## 2020-02-02 DIAGNOSIS — Z833 Family history of diabetes mellitus: Secondary | ICD-10-CM | POA: Diagnosis not present

## 2020-02-02 DIAGNOSIS — R5383 Other fatigue: Secondary | ICD-10-CM | POA: Diagnosis not present

## 2020-02-02 DIAGNOSIS — Z13220 Encounter for screening for lipoid disorders: Secondary | ICD-10-CM | POA: Diagnosis not present

## 2020-02-02 DIAGNOSIS — M2550 Pain in unspecified joint: Secondary | ICD-10-CM

## 2020-02-02 DIAGNOSIS — Z0000 Encounter for general adult medical examination without abnormal findings: Secondary | ICD-10-CM

## 2020-02-02 DIAGNOSIS — E538 Deficiency of other specified B group vitamins: Secondary | ICD-10-CM

## 2020-02-02 LAB — C-REACTIVE PROTEIN: CRP: <1 mg/dL (ref 0.5–20.0)

## 2020-02-02 LAB — COMPREHENSIVE METABOLIC PANEL
ALT: 12 U/L (ref 0–35)
AST: 15 U/L (ref 0–37)
Albumin: 4.5 g/dL (ref 3.5–5.2)
Alkaline Phosphatase: 40 U/L (ref 39–117)
BUN: 13 mg/dL (ref 6–23)
CO2: 29 mEq/L (ref 19–32)
Calcium: 9.8 mg/dL (ref 8.4–10.5)
Chloride: 103 mEq/L (ref 96–112)
Creatinine, Ser: 0.69 mg/dL (ref 0.40–1.20)
GFR: 96.84 mL/min (ref >60.00)
Glucose, Bld: 88 mg/dL (ref 70–99)
Potassium: 4.4 mEq/L (ref 3.5–5.1)
Sodium: 139 mEq/L (ref 135–145)
Total Bilirubin: 0.3 mg/dL (ref 0.2–1.2)
Total Protein: 7 g/dL (ref 6.0–8.3)

## 2020-02-02 LAB — CBC WITH DIFFERENTIAL/PLATELET
Basophils Absolute: 0 10*3/uL (ref 0.0–0.1)
Basophils Relative: 0.4 % (ref 0.0–3.0)
Eosinophils Absolute: 0.2 10*3/uL (ref 0.0–0.7)
Eosinophils Relative: 3.4 % (ref 0.0–5.0)
HCT: 44.6 % (ref 36.0–46.0)
Hemoglobin: 14.9 g/dL (ref 12.0–15.0)
Lymphocytes Relative: 27 % (ref 12.0–46.0)
Lymphs Abs: 1.8 10*3/uL (ref 0.7–4.0)
MCHC: 33.5 g/dL (ref 30.0–36.0)
MCV: 95 fl (ref 78.0–100.0)
Monocytes Absolute: 0.4 10*3/uL (ref 0.1–1.0)
Monocytes Relative: 6.4 % (ref 3.0–12.0)
Neutro Abs: 4.1 10*3/uL (ref 1.4–7.7)
Neutrophils Relative %: 62.8 % (ref 43.0–77.0)
Platelets: 230 10*3/uL (ref 150.0–400.0)
RBC: 4.69 Mil/uL (ref 3.87–5.11)
RDW: 13.3 % (ref 11.5–15.5)
WBC: 6.5 10*3/uL (ref 4.0–10.5)

## 2020-02-02 LAB — LIPID PANEL
Cholesterol: 186 mg/dL (ref 0–200)
HDL: 51.3 mg/dL (ref >39.00)
LDL Cholesterol: 123 mg/dL — ABNORMAL HIGH (ref 0–99)
NonHDL: 135.13
Total CHOL/HDL Ratio: 4
Triglycerides: 62 mg/dL (ref 0.0–149.0)
VLDL: 12.4 mg/dL (ref 0.0–40.0)

## 2020-02-02 LAB — SEDIMENTATION RATE: Sed Rate: 19 mm/hr (ref 0–20)

## 2020-02-02 LAB — VITAMIN B12: Vitamin B-12: 289 pg/mL (ref 211–911)

## 2020-02-02 LAB — TSH: TSH: 1.2 u[IU]/mL (ref 0.35–4.50)

## 2020-02-02 LAB — VITAMIN D 25 HYDROXY (VIT D DEFICIENCY, FRACTURES): VITD: 30.51 ng/mL (ref 30.00–100.00)

## 2020-02-02 LAB — HEMOGLOBIN A1C: Hgb A1c MFr Bld: 4.7 % (ref 4.6–6.5)

## 2020-02-02 NOTE — Progress Notes (Signed)
Madison [name] DOB: 1986/09/26 Encounter date: 02/02/2020  This is a 34 y.o. female who presents for complete physical   History of present illness/Additional concerns: Feels like she is tired all the time. Has had trouble with B12 levels in the past (6-7 years ago) and did injections. Working from home. Since COVID started she has put on 6-7 lbs and can't get rid of this. Actually eating healthier than she used to. Exercising 30+ minutes/day.   Allergies/sinus: have been horrible, but hasn't felt like she had infection yet. Not sure if related to fatigue.   Low back pain: has been doing well. Needs new work chair; but might be returning to work.   Asthma: has been doing really well with this. Last time she used inhaler was Jan/Feb.  Migraines: hasn't had one in a year and a half.   Follows w gyn - Christophe Louis with [facility]: just saw her 2 weeks ago 25 with derm regularly  Past Medical History:  Diagnosis Date  . Allergy   . Asthma   . Hx of migraines 01/012003   can tell when they come on, treats with excedrin migraine.  . Scoliosis 10/24/1995  . Vitamin D deficiency    Past Surgical History:  Procedure Laterality Date  . partial spinal fusion C7-T2 due to scoliosis 03/1998 N/A 04/11/1998  . TONSILLECTOMY    . WISDOM TOOTH EXTRACTION     Allergies  Allergen Reactions  . Prednisone     Tingling in hands and face  . Meloxicam     heartburn   Current Meds  Medication Sig  . cyclobenzaprine (FLEXERIL) 5 MG tablet Take 1-2 tablets (5-10 mg total) by mouth at bedtime as needed for muscle spasms.  . fexofenadine (ALLEGRA) 30 MG tablet Take 30 mg by mouth daily.   . montelukast (SINGULAIR) 10 MG tablet Take 1 tablet (10 mg total) by mouth at bedtime.  . Multiple Vitamins-Minerals (MULTIVITAMIN ADULTS PO) Take by mouth.  . Norethindrone-Ethinyl Estradiol-Fe Biphas (LO LOESTRIN FE) 1 MG-10 MCG / 10 MCG tablet Take 1 tablet by mouth daily.  . Probiotic Product (PROBIOTIC PO)  Take by mouth.  . Spacer/Aero-Holding Chambers (AEROCHAMBER PLUS WITH MASK) inhaler Use as directed.   Social History   Tobacco Use  . Smoking status: Never Smoker  . Smokeless tobacco: Never Used  Substance Use Topics  . Alcohol use: Yes    Comment: occasionally   Family History  Problem Relation Age of Onset  . Breast cancer Maternal Grandmother 72  . Osteoporosis Maternal Grandmother   . Heart failure Maternal Grandmother   . COPD Maternal Grandmother   . Healthy Mother   . Hearing loss Father   . Other Father        prostate level elevation  . Diabetes Mellitus II Brother        TWIN  . Prostate cancer Maternal Grandfather        w mets  . Heart disease Maternal Grandfather   . Heart failure Maternal Grandfather   . Brain cancer Paternal Grandmother   . Melanoma Paternal Grandfather   . Colon cancer Paternal Aunt      Review of Systems  Constitutional: Negative for activity change, appetite change, chills, fatigue, fever and unexpected weight change.  HENT: Negative for congestion, ear pain, hearing loss, sinus pressure, sinus pain, sore throat and trouble swallowing.   Eyes: Negative for pain and visual disturbance.  Respiratory: Negative for cough, chest tightness, shortness of breath and wheezing.  Cardiovascular: Negative for chest pain, palpitations and leg swelling.  Gastrointestinal: Negative for abdominal pain, blood in stool, constipation, diarrhea, nausea and vomiting.  Genitourinary: Negative for difficulty urinating and menstrual problem.  Musculoskeletal: Negative for arthralgias and back pain.  Skin: Negative for rash.  Neurological: Negative for dizziness, weakness, numbness and headaches.  Hematological: Negative for adenopathy. Does not bruise/bleed easily.  Psychiatric/Behavioral: Negative for sleep disturbance and suicidal ideas. The patient is not nervous/anxious.     CBC:  Lab Results  Component Value Date   WBC 9.1 06/29/2017   HGB 12.7  06/29/2017   HCT 40.3 06/29/2017   MCH 30.2 06/29/2017   MCHC 31.5 06/29/2017   RDW 13.2 06/29/2017   PLT 275 06/29/2017   CMP: Lab Results  Component Value Date   NA 139 11/02/2017   K 4.5 11/02/2017   CL 102 11/02/2017   CO2 24 11/02/2017   GLUCOSE 79 11/02/2017   BUN 11 11/02/2017   CREATININE 0.62 11/02/2017   LABGLOB 3.1 11/02/2017   GFRAA 138 11/02/2017   CALCIUM 9.8 11/02/2017   PROT 7.7 11/02/2017   AGRATIO 1.5 11/02/2017   BILITOT 0.2 11/02/2017   ALKPHOS 52 11/02/2017   ALT 15 11/02/2017   AST 18 11/02/2017   LIPID: Lab Results  Component Value Date   CHOL 157 06/29/2017   TRIG 64 06/29/2017   HDL 58 06/29/2017   LDLCALC 86 06/29/2017   LABVLDL 13 06/29/2017    Objective:  BP 98/70 (BP Location: Left Arm, Patient Position: Sitting, Cuff Size: Normal)   Pulse 74   Temp 98.1 F (36.7 C) (Temporal)   Ht 5' 2.75" (1.594 m)   Wt 135 lb 6.4 oz (61.4 kg)   LMP 12/12/2019 Comment: states OB/GYN stated abnormal cycles can occur with OCP-jaf  BMI 24.18 kg/m   Weight: 135 lb 6.4 oz (61.4 kg)   BP Readings from Last 3 Encounters:  02/02/20 98/70  01/01/20 118/80  12/02/19 122/84   Wt Readings from Last 3 Encounters:  02/02/20 135 lb 6.4 oz (61.4 kg)  01/01/20 136 lb (61.7 kg)  12/02/19 137 lb (62.1 kg)    Physical Exam Constitutional:      General: She is not in acute distress.    Appearance: She is well-developed.  HENT:     Head: Normocephalic and atraumatic.     Right Ear: External ear normal.     Left Ear: External ear normal.     Mouth/Throat:     Pharynx: No oropharyngeal exudate.  Eyes:     Conjunctiva/sclera: Conjunctivae normal.     Pupils: Pupils are equal, round, and reactive to light.  Neck:     Thyroid: No thyromegaly.  Cardiovascular:     Rate and Rhythm: Normal rate and regular rhythm.     Heart sounds: Normal heart sounds. No murmur. No friction rub. No gallop.   Pulmonary:     Effort: Pulmonary effort is normal.     Breath  sounds: Normal breath sounds.  Abdominal:     General: Bowel sounds are normal. There is no distension.     Palpations: Abdomen is soft. There is no mass.     Tenderness: There is no abdominal tenderness. There is no guarding.     Hernia: No hernia is present.  Musculoskeletal:        General: No tenderness or deformity. Normal range of motion.     Cervical back: Normal range of motion and neck supple.  Lymphadenopathy:  Cervical: No cervical adenopathy.  Skin:    General: Skin is warm and dry.     Findings: No rash.  Neurological:     Mental Status: She is alert and oriented to person, place, and time.     Deep Tendon Reflexes: Reflexes normal.     Reflex Scores:      Tricep reflexes are 2+ on the right side and 2+ on the left side.      Bicep reflexes are 2+ on the right side and 2+ on the left side.      Brachioradialis reflexes are 2+ on the right side and 2+ on the left side.      Patellar reflexes are 2+ on the right side and 2+ on the left side. Psychiatric:        Speech: Speech normal.        Behavior: Behavior normal.        Thought Content: Thought content normal.     Assessment/Plan: There are no preventive care reminders to display for this patient. Health Maintenance reviewed.  1. Preventative health care Keep up with exercise and healthy eating.   2. Fatigue, unspecified type Start with bloodwork; further eval pending results. - CBC with Differential/Platelet; Future - Comprehensive metabolic panel; Future - TSH; Future - Vitamin B12; Future - VITAMIN D 25 Hydroxy (Vit-D Deficiency, Fractures); Future  3. B12 deficiency - Vitamin B12; Future  4. Family history of diabetes mellitus - Hemoglobin A1c; Future  5. Lipid screening - Lipid panel; Future  6. Arthralgia, unspecified joint - ANA; Future - Sedimentation rate; Future - C-reactive protein; Future  Return for pending labs.  Micheline Rough, MD

## 2020-02-02 NOTE — Addendum Note (Signed)
Addended by: Suzette Battiest on: 02/02/2020 08:36 AM   Modules accepted: Orders

## 2020-02-03 ENCOUNTER — Encounter: Payer: Self-pay | Admitting: Family Medicine

## 2020-02-03 ENCOUNTER — Ambulatory Visit: Payer: 59 | Admitting: Family Medicine

## 2020-02-03 VITALS — BP 110/70 | HR 75 | Ht 60.0 in | Wt 132.0 lb

## 2020-02-03 DIAGNOSIS — M999 Biomechanical lesion, unspecified: Secondary | ICD-10-CM | POA: Diagnosis not present

## 2020-02-03 DIAGNOSIS — M41125 Adolescent idiopathic scoliosis, thoracolumbar region: Secondary | ICD-10-CM | POA: Diagnosis not present

## 2020-02-03 NOTE — Patient Instructions (Addendum)
Ok to run-max 38mles Muscle relaxor Stretch after working out See me again in 4 weeks

## 2020-02-03 NOTE — Progress Notes (Signed)
[facility] [address] [place] Phone: [phone] Subjective:   I [name] am serving as a Education administrator for Dr. Hulan Saas.  This visit occurred during the SARS-CoV-2 public health emergency.  Safety protocols were in place, including screening questions prior to the visit, additional usage of staff PPE, and extensive cleaning of exam room while observing appropriate contact time as indicated for disinfecting solutions.   I'm seeing this patient by the request  of:  Caren Macadam, MD  CC: Neck and back pain follow-up  [id number]  Madison [name] is a 34 y.o. female coming in with complaint of back pain. Last seen on 12/02/2019 for OMT. Patient states she is stiff on the right side of her neck. Overall doing well. Discussed HEP       Past Medical History:  Diagnosis Date  . Allergy   . Asthma   . Hx of migraines 01/012003   can tell when they come on, treats with excedrin migraine.  . Scoliosis 10/24/1995  . Vitamin D deficiency    Past Surgical History:  Procedure Laterality Date  . partial spinal fusion C7-T2 due to scoliosis 03/1998 N/A 04/11/1998  . TONSILLECTOMY    . WISDOM TOOTH EXTRACTION     Social History   Socioeconomic History  . Marital status: Married    Spouse name: Not on file  . Number of children: 0  . Years of education: Not on file  . Highest education level: Not on file  Occupational History  . Not on file  Tobacco Use  . Smoking status: Never Smoker  . Smokeless tobacco: Never Used  Substance and Sexual Activity  . Alcohol use: Yes    Comment: occasionally  . Drug use: No  . Sexual activity: Yes    Partners: Male    Birth control/protection: I.U.D.    Comment: skyla  Other Topics Concern  . Not on file  Social History Narrative  . Not on file   Social Determinants of Health   Financial Resource Strain:   . Difficulty of Paying Living Expenses:   Food Insecurity:   .  Worried About Charity fundraiser in the Last Year:   . Arboriculturist in the Last Year:   Transportation Needs:   . Film/video editor (Medical):   Marland Kitchen Lack of Transportation (Non-Medical):   Physical Activity:   . Days of Exercise per Week:   . Minutes of Exercise per Session:   Stress:   . Feeling of Stress :   Social Connections:   . Frequency of Communication with Friends and Family:   . Frequency of Social Gatherings with Friends and Family:   . Attends Religious Services:   . Active Member of Clubs or Organizations:   . Attends Archivist Meetings:   Marland Kitchen Marital Status:    Allergies  Allergen Reactions  . Prednisone     Tingling in hands and face  . Meloxicam     heartburn   Family History  Problem Relation Age of Onset  . Breast cancer Maternal Grandmother 83  . Osteoporosis Maternal Grandmother   . Heart failure Maternal Grandmother   . COPD Maternal Grandmother   . Healthy Mother   . Hearing loss Father   . Other Father        prostate level elevation  . Diabetes Mellitus II Brother        TWIN  . Prostate cancer Maternal  Grandfather        w mets  . Heart disease Maternal Grandfather   . Heart failure Maternal Grandfather   . Brain cancer Paternal Grandmother   . Melanoma Paternal Grandfather   . Colon cancer Paternal Aunt     Current Outpatient Medications (Endocrine & Metabolic):  Marland Kitchen  Norethindrone-Ethinyl Estradiol-Fe Biphas (LO LOESTRIN FE) 1 MG-10 MCG / 10 MCG tablet, Take 1 tablet by mouth daily.   Current Outpatient Medications (Respiratory):  .  fexofenadine (ALLEGRA) 30 MG tablet, Take 30 mg by mouth daily.  .  montelukast (SINGULAIR) 10 MG tablet, Take 1 tablet (10 mg total) by mouth at bedtime. Marland Kitchen  albuterol (VENTOLIN HFA) 108 (90 Base) MCG/ACT inhaler, Inhale 2 puffs into the lungs every 6 (six) hours as needed for up to 10 days.    Current Outpatient Medications (Other):  .  cyclobenzaprine (FLEXERIL) 5 MG tablet, Take 1-2  tablets (5-10 mg total) by mouth at bedtime as needed for muscle spasms. .  Multiple Vitamins-Minerals (MULTIVITAMIN ADULTS PO), Take by mouth. .  Probiotic Product (PROBIOTIC PO), Take by mouth. .  Spacer/Aero-Holding Chambers (AEROCHAMBER PLUS WITH MASK) inhaler, Use as directed.   Reviewed prior external information including notes and imaging from  primary care provider As well as notes that were available from care everywhere and other healthcare systems.  Past medical history, social, surgical and family history all reviewed in electronic medical record.  No pertanent information unless stated regarding to the chief complaint.   Review of Systems:  No headache, visual changes, nausea, vomiting, diarrhea, constipation, dizziness, abdominal pain, skin rash, fevers, chills, night sweats, weight loss, swollen lymph nodes, body aches, joint swelling, chest pain, shortness of breath, mood changes. POSITIVE muscle aches  Objective  Blood pressure 110/70, pulse 75, height 5' (1.524 m), weight 132 lb (59.9 kg), SpO2 97 %.   General: No apparent distress alert and oriented x3 mood and affect normal, dressed appropriately.  HEENT: Pupils equal, extraocular movements intact  Respiratory: Patient's speak in full sentences and does not appear short of breath  Cardiovascular: No lower extremity edema, non tender, no erythema  Neuro: Cranial nerves II through XII are intact, neurovascularly intact in all extremities with 2+ DTRs and 2+ pulses.  Gait normal with good balance and coordination.  MSK:  Non tender with full range of motion and good stability and symmetric strength and tone of shoulders, elbows, wrist, hip, knee and ankles bilaterally.    Back - Normal skin, significant scoliosis   muscle tightness in the right parascapular region.   Range of motion is full at neck and lumbar sacral regions  Osteopathic findings  C4 flexed rotated and side bent left T7 extended rotated and side bent  left L2 flexed rotated and side bent right Sacrum right on right     Impression and Recommendations:     This case required medical decision making of moderate complexity. The above documentation has been reviewed and is accurate and complete Lyndal Pulley, DO       Note: This dictation was prepared with Dragon dictation along with smaller phrase technology. Any transcriptional errors that result from this process are unintentional.

## 2020-02-03 NOTE — Assessment & Plan Note (Signed)

## 2020-02-03 NOTE — Assessment & Plan Note (Signed)
Chronic problem, mild exacerbation or increase in pain, discussed medication including flexeril  HEP given, respond well to OMT  RTC in 4-8 weeks

## 2020-02-04 MED ORDER — MONTELUKAST SODIUM 10 MG PO TABS: 10.0000 mg | ORAL_TABLET | Freq: Every day | ORAL | 1 refills | Status: DC

## 2020-02-04 MED FILL — MONTELUKAST SOD 10 MG TAB: 10 | 90 days supply | Qty: 90 | Fill #0

## 2020-02-04 NOTE — Addendum Note (Signed)
Addended by: Agnes Lawrence on: 02/04/2020 02:39 PM   Modules accepted: Orders

## 2020-02-05 LAB — ANA: Anti Nuclear Antibody (ANA): NEGATIVE

## 2020-03-04 ENCOUNTER — Other Ambulatory Visit: Payer: Self-pay | Admitting: Obstetrics and Gynecology

## 2020-03-04 DIAGNOSIS — Z3202 Encounter for pregnancy test, result negative: Secondary | ICD-10-CM | POA: Diagnosis not present

## 2020-03-04 DIAGNOSIS — N72 Inflammatory disease of cervix uteri: Secondary | ICD-10-CM | POA: Diagnosis not present

## 2020-03-04 DIAGNOSIS — N870 Mild cervical dysplasia: Secondary | ICD-10-CM | POA: Diagnosis not present

## 2020-03-10 ENCOUNTER — Other Ambulatory Visit: Payer: Self-pay

## 2020-03-10 ENCOUNTER — Encounter: Payer: Self-pay | Admitting: Family Medicine

## 2020-03-10 ENCOUNTER — Ambulatory Visit: Payer: 59 | Admitting: Family Medicine

## 2020-03-10 VITALS — BP 106/72 | HR 75 | Ht 60.0 in | Wt 126.0 lb

## 2020-03-10 DIAGNOSIS — M999 Biomechanical lesion, unspecified: Secondary | ICD-10-CM

## 2020-03-10 DIAGNOSIS — M41125 Adolescent idiopathic scoliosis, thoracolumbar region: Secondary | ICD-10-CM | POA: Diagnosis not present

## 2020-03-10 NOTE — Assessment & Plan Note (Addendum)

## 2020-03-10 NOTE — Assessment & Plan Note (Signed)
Chronic problem with mild exacerbation.  Discussed medication management again in great length.  Patient is doing over-the-counter medications from time to time.  Patient was not taking anything for pain relief on a regular basis.  We have discussed the potential of that over the course of time but patient has had difficulties with different medications previously.  This includes meloxicam.  We discussed icing regimen.  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  Follow-up again in 4 to 8 weeks

## 2020-03-10 NOTE — Progress Notes (Signed)
[facility] [place] Phone: [phone] Subjective:   Madison [name], am serving as a scribe for Dr. Hulan Saas. This visit occurred during the SARS-CoV-2 public health emergency.  Safety protocols were in place, including screening questions prior to the visit, additional usage of staff PPE, and extensive cleaning of exam room while observing appropriate contact time as indicated for disinfecting solutions.   I'm seeing this patient by the request  of:  Koberlein, Steele Berg, MD  CC: Low back pain follow-up  [id number]  Madison [name] is a 34 y.o. female coming in with complaint of back pain. Last seen on 02/03/2020 for OMT. Patient states that she continues to have right lower back pain and right shoulder pain. Is able to pull on hip and adjust lower back. Pain in shoulder feels more like a tightness.        Past Medical History:  Diagnosis Date  . Allergy   . Asthma   . Hx of migraines 01/012003   can tell when they come on, treats with excedrin migraine.  . Scoliosis 10/24/1995  . Vitamin D deficiency    Past Surgical History:  Procedure Laterality Date  . partial spinal fusion C7-T2 due to scoliosis 03/1998 N/A 04/11/1998  . TONSILLECTOMY    . WISDOM TOOTH EXTRACTION     Social History   Socioeconomic History  . Marital status: Married    Spouse name: Not on file  . Number of children: 0  . Years of education: Not on file  . Highest education level: Not on file  Occupational History  . Not on file  Tobacco Use  . Smoking status: Never Smoker  . Smokeless tobacco: Never Used  Substance and Sexual Activity  . Alcohol use: Yes    Comment: occasionally  . Drug use: [name]  . Sexual activity: Yes    Partners: Male    Birth control/protection: I.U.D.    Comment: skyla  Other Topics Concern  . Not on file  Social History Narrative  . Not on file   Social Determinants of Health   Financial  Resource Strain:   . Difficulty of Paying Living Expenses:   Food Insecurity:   . Worried About Charity fundraiser in the Last Year:   . Arboriculturist in the Last Year:   Transportation Needs:   . Film/video editor (Medical):   Marland Kitchen Lack of Transportation (Non-Medical):   Physical Activity:   . Days of Exercise per Week:   . Minutes of Exercise per Session:   Stress:   . Feeling of Stress :   Social Connections:   . Frequency of Communication with Friends and Family:   . Frequency of Social Gatherings with Friends and Family:   . Attends Religious Services:   . Active Member of Clubs or Organizations:   . Attends Archivist Meetings:   Marland Kitchen Marital Status:    Allergies  Allergen Reactions  . Prednisone     Tingling in hands and face  . Meloxicam     heartburn   Family History  Problem Relation Age of Onset  . Breast cancer Maternal Grandmother 54  . Osteoporosis Maternal Grandmother   . Heart failure Maternal Grandmother   . COPD Maternal Grandmother   . Healthy Mother   . Hearing loss Father   . Other Father        prostate level elevation  . Diabetes Mellitus  II Brother        TWIN  . Prostate cancer Maternal Grandfather        w mets  . Heart disease Maternal Grandfather   . Heart failure Maternal Grandfather   . Brain cancer Paternal Grandmother   . Melanoma Paternal Grandfather   . Colon cancer Paternal Aunt     Current Outpatient Medications (Endocrine & Metabolic):  Marland Kitchen  Norethindrone-Ethinyl Estradiol-Fe Biphas (LO LOESTRIN FE) 1 MG-10 MCG / 10 MCG tablet, Take 1 tablet by mouth daily.   Current Outpatient Medications (Respiratory):  .  fexofenadine (ALLEGRA) 30 MG tablet, Take 30 mg by mouth daily.  .  montelukast (SINGULAIR) 10 MG tablet, Take 1 tablet (10 mg total) by mouth at bedtime. Marland Kitchen  albuterol (VENTOLIN HFA) 108 (90 Base) MCG/ACT inhaler, Inhale 2 puffs into the lungs every 6 (six) hours as needed for up to 10 days.    Current  Outpatient Medications (Other):  .  cyclobenzaprine (FLEXERIL) 5 MG tablet, Take 1-2 tablets (5-10 mg total) by mouth at bedtime as needed for muscle spasms. .  Multiple Vitamins-Minerals (MULTIVITAMIN ADULTS PO), Take by mouth. .  Probiotic Product (PROBIOTIC PO), Take by mouth. .  Spacer/Aero-Holding Chambers (AEROCHAMBER PLUS WITH MASK) inhaler, Use as directed.   Reviewed prior external information including notes and imaging from  primary care provider As well as notes that were available from care everywhere and other healthcare systems.  Past medical history, social, surgical and family history all reviewed in electronic medical record.  [name] pertanent information unless stated regarding to the chief complaint.   Review of Systems:  [name] headache, visual changes, nausea, vomiting, diarrhea, constipation, dizziness, abdominal pain, skin rash, fevers, chills, night sweats, weight loss, swollen lymph nodes, , joint swelling, chest pain, shortness of breath, mood changes. POSITIVE muscle aches, body aches  Objective  Blood pressure 106/72, pulse 75, height 5' (1.524 m), weight 126 lb (57.2 kg), SpO2 99 %.   General: [name] apparent distress alert and oriented x3 mood and affect normal, dressed appropriately.  HEENT: Pupils equal, extraocular movements intact  Respiratory: Patient's speak in full sentences and does not appear short of breath  Cardiovascular: [name] lower extremity edema, non tender, [name] erythema  Neuro: Cranial nerves II through XII are intact, neurovascularly intact in all extremities with 2+ DTRs and 2+ pulses.  Low back exam shows the patient does have significant scoliosis of the lumbosacral as well as the thoracic levels.  Tightness noted in the right parascapular region as well.  Full range of motion of the neck noted.  Hypermobility still noted to multiple joints.  Osteopathic findings C4 flexed rotated and side bent left T6 extended rotated and side bent left with inhaled  rib Group curvature noted at the thoracolumbar juncture L2 flexed rotated and side bent left Sacrum right on right    Impression and Recommendations:     This case required medical decision making of moderate complexity. The above documentation has been reviewed and is accurate and complete Lyndal Pulley, DO       Note: This dictation was prepared with Dragon dictation along with smaller phrase technology. Any transcriptional errors that result from this process are unintentional.

## 2020-03-10 NOTE — Patient Instructions (Signed)
See me in 4-5 weeks

## 2020-03-24 ENCOUNTER — Telehealth: Payer: Self-pay | Admitting: Family Medicine

## 2020-03-24 NOTE — Telephone Encounter (Signed)
Patient called stating that she received a bill from her recent visit with Dr Georgina Snell. She was billed for a Therapeutic Exercises charge that insurance is not paying for. I asked if someone came in to go over exercises with her, she said yes, but that they have done that in a visit with Dr Tamala Julian as well but she did not receive this charge.  Please advise.

## 2020-03-26 NOTE — Telephone Encounter (Signed)
lmovm for pt to return call.  

## 2020-04-07 ENCOUNTER — Ambulatory Visit: Payer: 59 | Admitting: Family Medicine

## 2020-04-07 ENCOUNTER — Other Ambulatory Visit: Payer: Self-pay

## 2020-04-07 ENCOUNTER — Encounter: Payer: Self-pay | Admitting: Family Medicine

## 2020-04-07 DIAGNOSIS — M25511 Pain in right shoulder: Secondary | ICD-10-CM | POA: Diagnosis not present

## 2020-04-07 DIAGNOSIS — M41125 Adolescent idiopathic scoliosis, thoracolumbar region: Secondary | ICD-10-CM | POA: Diagnosis not present

## 2020-04-07 DIAGNOSIS — M999 Biomechanical lesion, unspecified: Secondary | ICD-10-CM | POA: Diagnosis not present

## 2020-04-07 NOTE — Progress Notes (Signed)
[facility] [address] [place] Phone: [phone] Subjective:   I Madison [name] am serving as a Education administrator for Dr. Hulan Saas.  This visit occurred during the SARS-CoV-2 public health emergency.  Safety protocols were in place, including screening questions prior to the visit, additional usage of staff PPE, and extensive cleaning of exam room while observing appropriate contact time as indicated for disinfecting solutions.   I'm seeing this patient by the request  of:  Koberlein, Steele Berg, MD  CC: Neck and back pain follow-up  [id number]  Madison [name] is a 34 y.o. female coming in with complaint of back and neck pain Patient states if the barometric pressure changes she experiences spasms on her right side. Feeling better today.    Medications patient has been prescribed: Flexeril which patient has had to use secondary to the amount of pain  Nothing new just exacerbation of patient's previous pain.        Reviewed prior external information including notes and imaging from previsou exam, outside providers and external EMR if available.   As well as notes that were available from care everywhere and other healthcare systems.  Past medical history, social, surgical and family history all reviewed in electronic medical record.  No pertanent information unless stated regarding to the chief complaint.   Past Medical History:  Diagnosis Date  . Allergy   . Asthma   . Hx of migraines 01/012003   can tell when they come on, treats with excedrin migraine.  . Scoliosis 10/24/1995  . Vitamin D deficiency     Allergies  Allergen Reactions  . Prednisone     Tingling in hands and face  . Meloxicam     heartburn     Review of Systems:  No headache, visual changes, nausea, vomiting, diarrhea, constipation, dizziness, abdominal pain, skin rash, fevers, chills, night sweats, weight loss, swollen lymph nodes, body aches,  joint swelling, chest pain, shortness of breath, mood changes. POSITIVE muscle aches  Objective  There were no vitals taken for this visit.   General: No apparent distress alert and oriented x3 mood and affect normal, dressed appropriately.  HEENT: Pupils equal, extraocular movements intact  Respiratory: Patient's speak in full sentences and does not appear short of breath  Cardiovascular: No lower extremity edema, non tender, no erythema  Neuro: Cranial nerves II through XII are intact, neurovascularly intact in all extremities with 2+ DTRs and 2+ pulses.  Gait normal with good balance and coordination.  MSK:  Non tender with full range of motion and good stability and symmetric strength and tone of shoulders, elbows, wrist, hip, knee and ankles bilaterally.  Back - Normal skin, Spine with normal alignment and no deformity.  No tenderness to vertebral process palpation.  Paraspinous muscles are not tender and without spasm.   Range of motion is full at neck and lumbar sacral regions  Osteopathic findings  C2 flexed rotated and side bent right Group curve noted mostly of the thoracolumbar juncture right rotation left side bending from T10-L2 Sacrum right on right   After verbal consent patient was prepped with alcohol swab and with a 25-gauge needle injected into 3 distinct trigger points and 3 distinct muscles on the right shoulder region.  This includes the trapezius, deltoid, and levator scapula.  Total of 3 cc of 0.5% Marcaine and 1 cc of Kenalog 40 mg were used.  Minimal blood loss.  Band-Aids placed.  Postinjection instructions given  Assessment and Plan:  Scoliosis Chronic problem with exacerbation.  Patient does have some trigger points noted in the right shoulder region and given injection today that I think will be beneficial.  We discussed icing regimen and home exercise, which activities to do which wants to avoid.  Patient will increase activity slowly.  Follow-up again in 4  to 8 weeks    Nonallopathic problems  Decision today to treat with OMT was based on Physical Exam  After verbal consent patient was treated with HVLA, ME, FPR techniques in   thoracic, lumbar, and sacral  areas  Patient tolerated the procedure well with improvement in symptoms  Patient given exercises, stretches and lifestyle modifications  See medications in patient instructions if given  Patient will follow up in 4-8 weeks      The above documentation has been reviewed and is accurate and complete Lyndal Pulley, DO       Note: This dictation was prepared with Dragon dictation along with smaller phrase technology. Any transcriptional errors that result from this process are unintentional.

## 2020-04-07 NOTE — Patient Instructions (Signed)
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

## 2020-04-07 NOTE — Assessment & Plan Note (Signed)
Chronic problem with exacerbation.  Patient does have some trigger points noted in the right shoulder region and given injection today that I think will be beneficial.  We discussed icing regimen and home exercise, which activities to do which wants to avoid.  Patient will increase activity slowly.  Follow-up again in 4 to 8 weeks

## 2020-04-12 MED FILL — LO LOESTRIN FE 1-10 TABLET: 1 MG-10 MCG | 84 days supply | Qty: 84 | Fill #0

## 2020-04-16 ENCOUNTER — Encounter: Payer: Self-pay | Admitting: Family Medicine

## 2020-04-22 ENCOUNTER — Telehealth: Payer: 59 | Admitting: Emergency Medicine

## 2020-04-22 DIAGNOSIS — J069 Acute upper respiratory infection, unspecified: Secondary | ICD-10-CM

## 2020-04-22 DIAGNOSIS — R0981 Nasal congestion: Secondary | ICD-10-CM

## 2020-04-22 MED ORDER — IPRATROPIUM BROMIDE 0.03 % NA SOLN: 2.0000 | Freq: Two times a day (BID) | NASAL | 0 refills | Status: DC

## 2020-04-22 MED ORDER — AMOXICILLIN-POT CLAVULANATE 875-125 MG PO TABS: 1.0000 | ORAL_TABLET | Freq: Two times a day (BID) | ORAL | 0 refills | Status: DC

## 2020-04-22 MED FILL — AMOX-CLAV 875-125 MG TABLET: 875-125 | 7 days supply | Qty: 14 | Fill #0

## 2020-04-22 NOTE — Progress Notes (Signed)
We are sorry that you are not feeling well.  Here is how we plan to help!  Based on what you have shared with me it looks like you have sinusitis.  Sinusitis is inflammation and infection in the sinus cavities of the head.  Based on your presentation I believe you most likely have Acute Viral Sinusitis.This is an infection most likely caused by a virus. There is not specific treatment for viral sinusitis other than to help you with the symptoms until the infection runs its course.  You may use an oral decongestant such as Mucinex D or if you have glaucoma or high blood pressure use plain Mucinex. Saline nasal spray help and can safely be used as often as needed for congestion, I have prescribed: Ipratropium Bromide nasal spray 0.03% 2 sprays in eah nostril 2-3 times a day  Some authorities believe that zinc sprays or the use of Echinacea may shorten the course of your symptoms.  Sinus infections are not as easily transmitted as other respiratory infection, however we still recommend that you avoid close contact with loved ones, especially the very young and elderly.  Remember to wash your hands thoroughly throughout the day as this is the number one way to prevent the spread of infection!  Home Care:  Only take medications as instructed by your medical team.  Do not take these medications with alcohol.  A steam or ultrasonic humidifier can help congestion.  You can place a towel over your head and breathe in the steam from hot water coming from a faucet.  Avoid close contacts especially the very young and the elderly.  Cover your mouth when you cough or sneeze.  Always remember to wash your hands.  Get Help Right Away If:  You develop worsening fever or sinus pain.  You develop a severe head ache or visual changes.  Your symptoms persist after you have completed your treatment plan.  Make sure you  Understand these instructions.  Will watch your condition.  Will get help right  away if you are not doing well or get worse.  Your e-visit answers were reviewed by a board certified advanced clinical practitioner to complete your personal care plan.  Depending on the condition, your plan could have included both over the counter or prescription medications.  If there is a problem please reply  once you have received a response from your provider.  Your safety is important to [place].  If you have drug allergies check your prescription carefully.    You can use MyChart to ask questions about today's visit, request a non-urgent call back, or ask for a work or school excuse for 24 hours related to this e-Visit. If it has been greater than 24 hours you will need to follow up with your provider, or enter a new e-Visit to address those concerns.  You will get an e-mail in the next two days asking about your experience.  I hope that your e-visit has been valuable and will speed your recovery. Thank you for using e-visits.   Approximately 5 minutes was used in reviewing the patient's chart, questionnaire, prescribing medications, and documentation.

## 2020-05-14 ENCOUNTER — Encounter: Payer: Self-pay | Admitting: Family Medicine

## 2020-05-14 ENCOUNTER — Other Ambulatory Visit: Payer: Self-pay

## 2020-05-14 ENCOUNTER — Ambulatory Visit: Payer: 59 | Admitting: Family Medicine

## 2020-05-14 VITALS — BP 100/70 | HR 81 | Ht 60.0 in | Wt 134.0 lb

## 2020-05-14 DIAGNOSIS — M999 Biomechanical lesion, unspecified: Secondary | ICD-10-CM

## 2020-05-14 DIAGNOSIS — M41125 Adolescent idiopathic scoliosis, thoracolumbar region: Secondary | ICD-10-CM

## 2020-05-14 NOTE — Assessment & Plan Note (Signed)

## 2020-05-14 NOTE — Progress Notes (Signed)
[facility] [address] [place] Phone: [phone] Subjective:   I [name] am serving as a Education administrator for Dr. Hulan Saas.  This visit occurred during the SARS-CoV-2 public health emergency.  Safety protocols were in place, including screening questions prior to the visit, additional usage of staff PPE, and extensive cleaning of exam room while observing appropriate contact time as indicated for disinfecting solutions.   I'm seeing this patient by the request  of:  Koberlein, Steele Berg, MD  CC: Neck and back pain follow-up  [id number]   04/07/2020 Chronic problem with exacerbation.  Patient does have some trigger points noted in the right shoulder region and given injection today that I think will be beneficial.  We discussed icing regimen and home exercise, which activities to do which wants to avoid.  Patient will increase activity slowly.  Follow-up again in 4 to 8 weeks  7/23/20221 Shaquavia Whisonant is a 33 y.o. female coming in with complaint of back pain. Patient states she is 85% better. Certain movements cause pain. Believes she is moving in the right direction.  Patient is feeling like she is getting back to her baseline.  Has not been working out though regularly.  Concerned that if she does increase activity might have more of an exacerbation again.       Past Medical History:  Diagnosis Date  . Allergy   . Asthma   . Hx of migraines 01/012003   can tell when they come on, treats with excedrin migraine.  . Scoliosis 10/24/1995  . Vitamin D deficiency    Past Surgical History:  Procedure Laterality Date  . partial spinal fusion C7-T2 due to scoliosis 03/1998 N/A 04/11/1998  . TONSILLECTOMY    . WISDOM TOOTH EXTRACTION     Social History   Socioeconomic History  . Marital status: Married    Spouse name: Not on file  . Number of children: 0  . Years of education: Not on file  . Highest education level: Not  on file  Occupational History  . Not on file  Tobacco Use  . Smoking status: Never Smoker  . Smokeless tobacco: Never Used  Vaping Use  . Vaping Use: Never used  Substance and Sexual Activity  . Alcohol use: Yes    Comment: occasionally  . Drug use: No  . Sexual activity: Yes    Partners: Male    Birth control/protection: I.U.D.    Comment: skyla  Other Topics Concern  . Not on file  Social History Narrative  . Not on file   Social Determinants of Health   Financial Resource Strain:   . Difficulty of Paying Living Expenses:   Food Insecurity:   . Worried About Charity fundraiser in the Last Year:   . Arboriculturist in the Last Year:   Transportation Needs:   . Film/video editor (Medical):   Marland Kitchen Lack of Transportation (Non-Medical):   Physical Activity:   . Days of Exercise per Week:   . Minutes of Exercise per Session:   Stress:   . Feeling of Stress :   Social Connections:   . Frequency of Communication with Friends and Family:   . Frequency of Social Gatherings with Friends and Family:   . Attends Religious Services:   . Active Member of Clubs or Organizations:   . Attends Archivist Meetings:   Marland Kitchen Marital Status:    Allergies  Allergen Reactions  .  Prednisone     Tingling in hands and face  . Meloxicam     heartburn   Family History  Problem Relation Age of Onset  . Breast cancer Maternal Grandmother 62  . Osteoporosis Maternal Grandmother   . Heart failure Maternal Grandmother   . COPD Maternal Grandmother   . Healthy Mother   . Hearing loss Father   . Other Father        prostate level elevation  . Diabetes Mellitus II Brother        TWIN  . Prostate cancer Maternal Grandfather        w mets  . Heart disease Maternal Grandfather   . Heart failure Maternal Grandfather   . Brain cancer Paternal Grandmother   . Melanoma Paternal Grandfather   . Colon cancer Paternal Aunt     Current Outpatient Medications (Endocrine & Metabolic):   Marland Kitchen  Norethindrone-Ethinyl Estradiol-Fe Biphas (LO LOESTRIN FE) 1 MG-10 MCG / 10 MCG tablet, Take 1 tablet by mouth daily.   Current Outpatient Medications (Respiratory):  .  fexofenadine (ALLEGRA) 30 MG tablet, Take 30 mg by mouth daily.  .  montelukast (SINGULAIR) 10 MG tablet, Take 1 tablet (10 mg total) by mouth at bedtime. Marland Kitchen  albuterol (VENTOLIN HFA) 108 (90 Base) MCG/ACT inhaler, Inhale 2 puffs into the lungs every 6 (six) hours as needed for up to 10 days.    Current Outpatient Medications (Other):  .  cyclobenzaprine (FLEXERIL) 5 MG tablet, Take 1-2 tablets (5-10 mg total) by mouth at bedtime as needed for muscle spasms. .  Multiple Vitamins-Minerals (MULTIVITAMIN ADULTS PO), Take by mouth. .  Probiotic Product (PROBIOTIC PO), Take by mouth. .  Spacer/Aero-Holding Chambers (AEROCHAMBER PLUS WITH MASK) inhaler, Use as directed.   Reviewed prior external information including notes and imaging from  primary care provider As well as notes that were available from care everywhere and other healthcare systems.  Past medical history, social, surgical and family history all reviewed in electronic medical record.  No pertanent information unless stated regarding to the chief complaint.   Review of Systems:  No headache, visual changes, nausea, vomiting, diarrhea, constipation, dizziness, abdominal pain, skin rash, fevers, chills, night sweats, weight loss, swollen lymph nodes, body aches, joint swelling, chest pain, shortness of breath, mood changes. POSITIVE muscle aches  Objective  Blood pressure 100/70, pulse 81, height 5' (1.524 m), weight 134 lb (60.8 kg), SpO2 98 %.   General: No apparent distress alert and oriented x3 mood and affect normal, dressed appropriately.  HEENT: Pupils equal, extraocular movements intact  Respiratory: Patient's speak in full sentences and does not appear short of breath  Cardiovascular: No lower extremity edema, non tender, no erythema  Neuro:  Cranial nerves II through XII are intact, neurovascularly intact in all extremities with 2+ DTRs and 2+ pulses.  Gait normal with good balance and coordination.  MSK:   Scoliosis noted.  Patient does have some tenderness to palpation in the right parascapular region.  More in the right sacroiliac joint as well.  Some mild tenderness in the left sacral.  Negative straight leg test.  Neck exam does have good range of motion lacking last 5 degrees of sidebending bilaterally.  Osteopathic findings   T5 extended rotated and side bent right L2 flexed rotated and side bent right Sacrum right on right     Impression and Recommendations:     The above documentation has been reviewed and is accurate and complete Lyndal Pulley, DO  Note: This dictation was prepared with Dragon dictation along with smaller phrase technology. Any transcriptional errors that result from this process are unintentional.

## 2020-05-14 NOTE — Assessment & Plan Note (Signed)
Stable.  Discussed with patient about posture and ergonomics.  Patient is going to start increasing working out again on a more regular basis that I think will be beneficial.  We discussed icing regimen and home exercise, which activities to do which wants to avoid.  Increase activity slowly over the course the next several weeks.  Follow-up again in 4 to 8 weeks.

## 2020-05-14 NOTE — Patient Instructions (Signed)
Good to see you Overall much better When back from trip palettes 2 times a week Keep walking See me again in 4-6 weeks

## 2020-06-21 NOTE — Progress Notes (Signed)
[facility] [address] [place] Phone: [phone] Subjective:   I [name] am serving as a Education administrator for Dr. Hulan Saas.  This visit occurred during the SARS-CoV-2 public health emergency.  Safety protocols were in place, including screening questions prior to the visit, additional usage of staff PPE, and extensive cleaning of exam room while observing appropriate contact time as indicated for disinfecting solutions.   I'm seeing this patient by the request  of:  Koberlein, Steele Berg, MD  CC: Back pain, neck pain follow-up  [id number]  Madison [name] is a 34 y.o. female coming in with complaint of back and neck pain. OMT 05/14/2020. Patient states she is almost back to normal.  Has described a couple different spasms in the left upper shoulder region.  Medications patient has been prescribed: Flexeril very intermittently        Reviewed prior external information including notes and imaging from previsou exam, outside providers and external EMR if available.   As well as notes that were available from care everywhere and other healthcare systems.  Past medical history, social, surgical and family history all reviewed in electronic medical record.  No pertanent information unless stated regarding to the chief complaint.   Past Medical History:  Diagnosis Date  . Allergy   . Asthma   . Hx of migraines 01/012003   can tell when they come on, treats with excedrin migraine.  . Scoliosis 10/24/1995  . Vitamin D deficiency     Allergies  Allergen Reactions  . Prednisone     Tingling in hands and face  . Meloxicam     heartburn     Review of Systems:  No headache, visual changes, nausea, vomiting, diarrhea, constipation, dizziness, abdominal pain, skin rash, fevers, chills, night sweats, weight loss, swollen lymph nodes, body aches, joint swelling, chest pain, shortness of breath, mood changes. POSITIVE muscle  aches  Objective  Blood pressure 110/80, pulse 67, height 5' (1.524 m), weight 135 lb (61.2 kg), SpO2 98 %.   General: No apparent distress alert and oriented x3 mood and affect normal, dressed appropriately.  HEENT: Pupils equal, extraocular movements intact  Respiratory: Patient's speak in full sentences and does not appear short of breath  Cardiovascular: No lower extremity edema, non tender, no erythema  Neuro: Cranial nerves II through XII are intact, neurovascularly intact in all extremities with 2+ DTRs and 2+ pulses.  Gait normal with good balance and coordination.  MSK:  Non tender with full range of motion and good stability and symmetric strength and tone of shoulders, elbows, wrist, hip, knee and ankles bilaterally.  Back - Normal skin, Spine with normal alignment and no deformity.  No tenderness to vertebral process palpation.  Paraspinous muscles are not tender and without spasm.   Range of motion is full at neck and lumbar sacral regions  Osteopathic findings  T3-7 Neutral rotated left side bent right L2-5 neutral rotated right side bent left Sacrum right on right       Assessment and Plan:  Scoliosis Patient continues to have some mild discomfort with the scoliosis.  Mild exacerbation of more in the left upper aspect today which is different than patient's baseline.  Discussed with patient about icing regimen, home exercise, which activities to do which wants to avoid.  Patient is to increase activity slowly over the course the next several weeks.  Patient will follow up with me again 6 weeks.  Discussed medications with  this chronic problem mild exacerbation including the Flexeril up to 3 nights in a row    Nonallopathic problems  Decision today to treat with OMT was based on Physical Exam  After verbal consent patient was treated with HVLA, ME, FPR techniques in thoracic, lumbar, and sacral  areas  Patient tolerated the procedure well with improvement in  symptoms  Patient given exercises, stretches and lifestyle modifications  See medications in patient instructions if given  Patient will follow up in 4-8 weeks      The above documentation has been reviewed and is accurate and complete Lyndal Pulley, DO       Note: This dictation was prepared with Dragon dictation along with smaller phrase technology. Any transcriptional errors that result from this process are unintentional.

## 2020-06-22 ENCOUNTER — Ambulatory Visit: Payer: 59 | Admitting: Family Medicine

## 2020-06-22 ENCOUNTER — Encounter: Payer: Self-pay | Admitting: Family Medicine

## 2020-06-22 ENCOUNTER — Other Ambulatory Visit: Payer: Self-pay

## 2020-06-22 VITALS — BP 110/80 | HR 67 | Ht 60.0 in | Wt 135.0 lb

## 2020-06-22 DIAGNOSIS — M999 Biomechanical lesion, unspecified: Secondary | ICD-10-CM

## 2020-06-22 DIAGNOSIS — M41125 Adolescent idiopathic scoliosis, thoracolumbar region: Secondary | ICD-10-CM

## 2020-06-22 NOTE — Patient Instructions (Signed)
Good to see you Overall doing well Watch the left shoulder Ok to start running See me again in 6 weeks

## 2020-06-22 NOTE — Assessment & Plan Note (Signed)
Patient continues to have some mild discomfort with the scoliosis.  Mild exacerbation of more in the left upper aspect today which is different than patient's baseline.  Discussed with patient about icing regimen, home exercise, which activities to do which wants to avoid.  Patient is to increase activity slowly over the course the next several weeks.  Patient will follow up with me again 6 weeks.  Discussed medications with this chronic problem mild exacerbation including the Flexeril up to 3 nights in a row

## 2020-07-06 ENCOUNTER — Other Ambulatory Visit (HOSPITAL_COMMUNITY): Payer: Self-pay | Admitting: Obstetrics and Gynecology

## 2020-07-06 MED FILL — LO LOESTRIN FE 1-10 TABLET: 1 MG-10 MCG | 84 days supply | Qty: 84 | Fill #0

## 2020-08-02 NOTE — Progress Notes (Signed)
[place] Phone: [phone] Subjective:   Fontaine No, am serving as a scribe for Dr. Hulan Saas. This visit occurred during the SARS-CoV-2 public health emergency.  Safety protocols were in place, including screening questions prior to the visit, additional usage of staff PPE, and extensive cleaning of exam room while observing appropriate contact time as indicated for disinfecting solutions.   I'm seeing this patient by the request  of:  Koberlein, Steele Berg, MD  CC: Neck and back pain follow-up  [id number]  Madison [name] is a 34 y.o. female coming in with complaint of back and neck pain. OMT 06/22/2020. Patient states that she did have some pain over the weekend. Patient did take Zanaflex one night and her pain subsided. Has started to run and has pain in LRQ. Also getting side stitches around bra line. Otherwise feels stronger.   Medications patient has been prescribed: Flexeril  Taking: Intermittently very rarely         Reviewed prior external information including notes and imaging from previsou exam, outside providers and external EMR if available.   As well as notes that were available from care everywhere and other healthcare systems.  Past medical history, social, surgical and family history all reviewed in electronic medical record.  No pertanent information unless stated regarding to the chief complaint.   Past Medical History:  Diagnosis Date  . Allergy   . Asthma   . Hx of migraines 01/012003   can tell when they come on, treats with excedrin migraine.  . Scoliosis 10/24/1995  . Vitamin D deficiency     Allergies  Allergen Reactions  . Prednisone     Tingling in hands and face  . Meloxicam     heartburn     Review of Systems:  No headache, visual changes, nausea, vomiting, diarrhea, constipation, dizziness, abdominal pain, skin rash, fevers, chills, night sweats,  weight loss, swollen lymph nodes, body aches, joint swelling, chest pain, shortness of breath, mood changes. POSITIVE muscle aches  Objective  Blood pressure 104/82, pulse 81, height 5' (1.524 m), weight 136 lb (61.7 kg), SpO2 99 %.   General: No apparent distress alert and oriented x3 mood and affect normal, dressed appropriately.  HEENT: Pupils equal, extraocular movements intact  Respiratory: Patient's speak in full sentences and does not appear short of breath  Cardiovascular: No lower extremity edema, non tender, no erythema   Significant scoliosis pain at patient's baseline.  Some tenderness to palpation in the paraspinal musculature right greater than left of the lumbar spine.  Some mild increase in the left shoulder Mild increase in hip flexor tightness and pelvic musculature.  Osteopathic findings  T3-7 neutral rotated left side bent right L2-5 neutral rotated right side bent left Sacrum right on right     Assessment and Plan:  Scoliosis Patient has been doing remarkably well overall.  Has been increasing activity and is running some.  We discussed different exercises for more of the hip flexors that I think are going to be contributing.  Patient does have significant tightness of the pelvic girdle discussed posture and ergonomics, discussed home exercise, increase activity slowly.  Patient will follow up with me again 6 to 8 weeks.    Nonallopathic problems  Decision today to treat with OMT was based on Physical Exam  After verbal consent patient was treated with HVLA, ME, FPR techniques in  thoracic, lumbar, and sacral  areas  Patient tolerated the procedure well with improvement in symptoms  Patient given exercises, stretches and lifestyle modifications  See medications in patient instructions if given  Patient will follow up in 4-8 weeks      The above documentation has been reviewed and is accurate and complete Lyndal Pulley, DO       Note: This  dictation was prepared with Dragon dictation along with smaller phrase technology. Any transcriptional errors that result from this process are unintentional.

## 2020-08-03 ENCOUNTER — Encounter: Payer: Self-pay | Admitting: Family Medicine

## 2020-08-03 ENCOUNTER — Other Ambulatory Visit: Payer: Self-pay

## 2020-08-03 ENCOUNTER — Ambulatory Visit: Payer: 59 | Admitting: Family Medicine

## 2020-08-03 VITALS — BP 104/82 | HR 81 | Ht 60.0 in | Wt 136.0 lb

## 2020-08-03 DIAGNOSIS — M999 Biomechanical lesion, unspecified: Secondary | ICD-10-CM | POA: Diagnosis not present

## 2020-08-03 DIAGNOSIS — M41125 Adolescent idiopathic scoliosis, thoracolumbar region: Secondary | ICD-10-CM | POA: Diagnosis not present

## 2020-08-03 NOTE — Patient Instructions (Signed)
Add extra insole in shoe with running Exercise 3 times a week Seem me again in 4-6 weeks

## 2020-08-03 NOTE — Assessment & Plan Note (Signed)
Patient has been doing remarkably well overall.  Has been increasing activity and is running some.  We discussed different exercises for more of the hip flexors that I think are going to be contributing.  Patient does have significant tightness of the pelvic girdle discussed posture and ergonomics, discussed home exercise, increase activity slowly.  Patient will follow up with me again 6 to 8 weeks.

## 2020-08-05 ENCOUNTER — Encounter: Payer: Self-pay | Admitting: Family Medicine

## 2020-08-11 ENCOUNTER — Encounter: Payer: Self-pay | Admitting: Family Medicine

## 2020-08-11 DIAGNOSIS — Z1283 Encounter for screening for malignant neoplasm of skin: Secondary | ICD-10-CM

## 2020-09-07 NOTE — Progress Notes (Signed)
[name] Phone: [phone] Subjective:   I Madison [name] am serving as a Education administrator for Dr. Hulan Saas.  This visit occurred during the SARS-CoV-2 public health emergency.  Safety protocols were in place, including screening questions prior to the visit, additional usage of staff PPE, and extensive cleaning of exam room while observing appropriate contact time as indicated for disinfecting solutions.   I'm seeing this patient by the request  of:  Koberlein, Steele Berg, MD  CC: Low back pain follow-up  [id number]  Madison [name] is a 34 y.o. female coming in with complaint of back and neck pain. OMT 08/03/2020. Patient states she believes she is back to baseline.  Patient has been doing well.  Patient has been running.  Doing of 11-minute miles.  Patient has a race this weekend that she is excited about.  Has been doing it with her husband.           Reviewed prior external information including notes and imaging from previsou exam, outside providers and external EMR if available.   As well as notes that were available from care everywhere and other healthcare systems.  Past medical history, social, surgical and family history all reviewed in electronic medical record.  No pertanent information unless stated regarding to the chief complaint.   Past Medical History:  Diagnosis Date  . Allergy   . Asthma   . Hx of migraines 01/012003   can tell when they come on, treats with excedrin migraine.  . Scoliosis 10/24/1995  . Vitamin D deficiency     Allergies  Allergen Reactions  . Prednisone     Tingling in hands and face  . Meloxicam     heartburn     Review of Systems:  No headache, visual changes, nausea, vomiting, diarrhea, constipation, dizziness, abdominal pain, skin rash, fevers, chills, night sweats, weight loss, swollen lymph nodes, body aches, joint swelling, chest pain, shortness of  breath, mood changes. POSITIVE muscle aches  Objective  Blood pressure 100/80, pulse 74, height 5' (1.524 m), weight 136 lb (61.7 kg), SpO2 98 %.   General: No apparent distress alert and oriented x3 mood and affect normal, dressed appropriately.  HEENT: Pupils equal, extraocular movements intact  Respiratory: Patient's speak in full sentences and does not appear short of breath  Cardiovascular: No lower extremity edema, non tender, no erythema  Neuro: Cranial nerves II through XII are intact, neurovascularly intact in all extremities with 2+ DTRs and 2+ pulses.  Gait normal with good balance and coordination.  MSK:  Non tender with full range of motion and good stability and symmetric strength and tone of shoulders, elbows, wrist, hip, knee and ankles bilaterally.  Back -significant scoliosis noted.  Patient with baseline.  Very minimally tender though.  Still has some mild limited sidebending and rotation of the back in multiple different areas.  Patient noted to have some improvement in range of motion of the hips at the moment.  Osteopathic findings  T3-6 neutral rotated left side bent right L2-5 neutral rotated right side bent left Sacrum left on left       Assessment and Plan: Scoliosis Patient has done remarkably well at this time. We discussed posture and ergonomics, which activities to do which wants to avoid. Patient has been running and doing much better. Has Flexeril for breakthrough pain. Follow-up with me again 6 to 8 weeks      Nonallopathic problems  Decision today to treat with OMT was based on Physical Exam  After verbal consent patient was treated with HVLA, ME, FPR techniques in  thoracic, lumbar, and sacral  areas  Patient tolerated the procedure well with improvement in symptoms  Patient given exercises, stretches and lifestyle modifications  See medications in patient instructions if given  Patient will follow up in 4-8 weeks      The above  documentation has been reviewed and is accurate and complete Lyndal Pulley, DO       Note: This dictation was prepared with Dragon dictation along with smaller phrase technology. Any transcriptional errors that result from this process are unintentional.

## 2020-09-08 ENCOUNTER — Ambulatory Visit: Payer: 59 | Admitting: Family Medicine

## 2020-09-08 ENCOUNTER — Encounter: Payer: Self-pay | Admitting: Family Medicine

## 2020-09-08 ENCOUNTER — Other Ambulatory Visit: Payer: Self-pay

## 2020-09-08 VITALS — BP 100/80 | HR 74 | Ht 60.0 in | Wt 136.0 lb

## 2020-09-08 DIAGNOSIS — M999 Biomechanical lesion, unspecified: Secondary | ICD-10-CM | POA: Diagnosis not present

## 2020-09-08 DIAGNOSIS — M41125 Adolescent idiopathic scoliosis, thoracolumbar region: Secondary | ICD-10-CM | POA: Diagnosis not present

## 2020-09-08 NOTE — Assessment & Plan Note (Signed)
Patient has done remarkably well at this time. We discussed posture and ergonomics, which activities to do which wants to avoid. Patient has been running and doing much better. Has Flexeril for breakthrough pain. Follow-up with me again 6 to 8 weeks

## 2020-09-08 NOTE — Patient Instructions (Signed)
Good to see you Good luck with the race! Send me a message Monday See me again in 6-8 weeks

## 2020-09-13 ENCOUNTER — Encounter: Payer: Self-pay | Admitting: Family Medicine

## 2020-09-22 MED FILL — LO LOESTRIN FE 1-10 TABLET: 1 MG-10 MCG | 84 days supply | Qty: 84 | Fill #1

## 2020-10-21 NOTE — Progress Notes (Signed)
Madison Madison [name] [place] Phone: [phone] Subjective:   Madison Madison [name], am serving as a scribe for Dr. Hulan [name]. This visit occurred during the SARS-CoV-2 public health emergency.  Safety protocols were in place, including screening questions prior to the visit, additional usage of staff PPE, and extensive cleaning of exam room while observing appropriate contact time as indicated for disinfecting solutions.   I'm seeing this patient by the request  of:  Madison Madison [name], Madison Berg, MD  CC: Low back pain follow-up  [id number]  Madison Madison [name] is a 34 y.o. female coming in with complaint of back and neck pain. OMT 09/08/2020. Patient states continues to have some difficulties but very minimal.  Continues to run on a regular basis.  Patient has gotten a standing desk and feels like that has been the most beneficial.  Patient denies any radiation of any of the extremities.  Mild discomfort here and there but nothing that stopping her from activity.  Medications patient has been prescribed: Likely benzo.  Taking: Very intermittently         Reviewed prior external information including notes and imaging from previsou exam, outside providers and external EMR if available.   As well as notes that were available from care everywhere and other healthcare systems.  Past medical history, social, surgical and family history all reviewed in electronic medical record.  Madison [name] pertanent information unless stated regarding to the chief complaint.   Past Medical History:  Diagnosis Date  . Allergy   . Asthma   . Hx of migraines 01/012003   can tell when they come on, treats with excedrin migraine.  . Scoliosis 10/24/1995  . Vitamin D deficiency     Allergies  Allergen Reactions  . Prednisone     Tingling in hands and face  . Meloxicam     heartburn     Review of Systems:  Madison [name] headache, visual changes, nausea, vomiting,  diarrhea, constipation, dizziness, abdominal pain, skin rash, fevers, chills, night sweats, weight loss, swollen lymph nodes, body aches, joint swelling, chest pain, shortness of breath, mood changes. POSITIVE muscle aches  Objective  Blood pressure 106/70, pulse 72, height 5' (1.524 m), SpO2 98 %.   General: Madison [name] apparent distress alert and oriented x3 mood and affect normal, dressed appropriately.  HEENT: Pupils equal, extraocular movements intact  Respiratory: Patient's speak in full sentences and does not appear short of breath  Cardiovascular: Madison [name] lower extremity edema, non tender, Madison [name] erythema  Back -significant scoliosis still noted.  Seems to be at patient's baseline.  Minimal tenderness but does have some mild increase in tightness.  Tightness with straight leg test but Madison [name] radicular symptoms.  Still mild limited sidebending and rotation left greater than right.  Improvement in strength of the hips  Osteopathic findings  T3-6 neutral rotated left side bent right L2-4 neutral rotated right side bent left Sacrum left on left       Assessment and Plan:  Scoliosis Severe overall but patient has made significant progress.  Patient does have Flexeril for any breakthrough pain.  Patient continues to be active.  Able to continue to increase her running very slowly.  We do not want her to increase too quickly though.  Patient is able to go skiing.  Increase activity where appropriate.  Follow-up with me again in 6 to 8 weeks  Nonallopathic lesion of lumbosacral region   Decision today to treat with OMT was based on  Physical Exam  After verbal consent patient was treated with HVLA, ME, FPR techniques in  thoracic, lumbar and sacral areas, all areas are chronic   Patient tolerated the procedure well with improvement in symptoms  Patient given exercises, stretches and lifestyle modifications  See medications in patient instructions if given  Patient will follow up in 6-8 weeks          The above documentation has been reviewed and is accurate and complete Madison Pulley, DO       Note: This dictation was prepared with Dragon dictation along with smaller phrase technology. Any transcriptional errors that result from this process are unintentional.

## 2020-10-26 ENCOUNTER — Other Ambulatory Visit: Payer: Self-pay

## 2020-10-26 ENCOUNTER — Ambulatory Visit: Payer: 59 | Admitting: Family Medicine

## 2020-10-26 ENCOUNTER — Encounter: Payer: Self-pay | Admitting: Family Medicine

## 2020-10-26 VITALS — BP 106/70 | HR 72 | Ht 60.0 in

## 2020-10-26 DIAGNOSIS — M41125 Adolescent idiopathic scoliosis, thoracolumbar region: Secondary | ICD-10-CM | POA: Diagnosis not present

## 2020-10-26 DIAGNOSIS — M999 Biomechanical lesion, unspecified: Secondary | ICD-10-CM

## 2020-10-26 NOTE — Patient Instructions (Signed)
Overall doing terrific Able to add on 1/2 mile one day a week to running See me again in 6-8 weeks

## 2020-10-26 NOTE — Assessment & Plan Note (Signed)

## 2020-10-26 NOTE — Assessment & Plan Note (Signed)
Severe overall but patient has made significant progress.  Patient does have Flexeril for any breakthrough pain.  Patient continues to be active.  Able to continue to increase her running very slowly.  We do not want her to increase too quickly though.  Patient is able to go skiing.  Increase activity where appropriate.  Follow-up with me again in 6 to 8 weeks

## 2020-11-11 ENCOUNTER — Encounter: Payer: Self-pay | Admitting: Family Medicine

## 2020-12-07 NOTE — Progress Notes (Signed)
[facility] [address] [place] Phone: [phone] Subjective:   I Madison [name] am serving as a Education administrator for Dr. Hulan Saas.  This visit occurred during the SARS-CoV-2 public health emergency.  Safety protocols were in place, including screening questions prior to the visit, additional usage of staff PPE, and extensive cleaning of exam room while observing appropriate contact time as indicated for disinfecting solutions.   I'm seeing this patient by the request  of:  Koberlein, Steele Berg, MD  CC: Back pain and neck pain  [id number]  Madison [name] is a 35 y.o. female coming in with complaint of back and neck pain. OMT 10/26/2020. Patient has had COVID since last visit. Patient states since she had covid she was not active. Right lower back is catching and the shoulder is acting up. More discomfort than pain 3/10.  Nothing that stopping her from activity but still feels like she does not have quite the pain she has had before more fatigue.  Medications patient has been prescribed: Flexeril  Taking: No         Reviewed prior external information including notes and imaging from previsou exam, outside providers and external EMR if available.   As well as notes that were available from care everywhere and other healthcare systems.  Past medical history, social, surgical and family history all reviewed in electronic medical record.  No pertanent information unless stated regarding to the chief complaint.   Past Medical History:  Diagnosis Date  . Allergy   . Asthma   . Hx of migraines 01/012003   can tell when they come on, treats with excedrin migraine.  . Scoliosis 10/24/1995  . Vitamin D deficiency     Allergies  Allergen Reactions  . Prednisone     Tingling in hands and face  . Meloxicam     heartburn     Review of Systems:  No headache, visual changes, nausea, vomiting, diarrhea, constipation, dizziness,  abdominal pain, skin rash, fevers, chills, night sweats, weight loss, swollen lymph nodes, body aches, joint swelling, chest pain, shortness of breath, mood changes. POSITIVE muscle aches  Objective  Blood pressure 100/80, pulse 84, height 5' (1.524 m), weight 137 lb (62.1 kg), SpO2 100 %.   General: No apparent distress alert and oriented x3 mood and affect normal, dressed appropriately.  HEENT: Pupils equal, extraocular movements intact  Respiratory: Patient's speak in full sentences and does not appear short of breath  Cardiovascular: No lower extremity edema, non tender, no erythema  Neuro: Cranial nerves II through XII are intact, neurovascularly intact in all extremities with 2+ DTRs and 2+ pulses.  Gait normal with good balance and coordination.  MSK:  Non tender with full range of motion and good stability and symmetric strength and tone of shoulders, elbows, wrist, hip, knee and ankles bilaterally.  Back -   Osteopathic findings  T3-6 N R L2 flexed rotated and side bent leg Sacrum right on right      Assessment and Plan:    Nonallopathic problems  Decision today to treat with OMT was based on Physical Exam  After verbal consent patient was treated with HVLA, ME, FPR techniques in  thoracic, lumbar, and sacral  areas  Patient tolerated the procedure well with improvement in symptoms  Patient given exercises, stretches and lifestyle modifications  See medications in patient instructions if given  Patient will follow up in 4-8 weeks      The above  documentation has been reviewed and is accurate and complete Lyndal Pulley, DO       Note: This dictation was prepared with Dragon dictation along with smaller phrase technology. Any transcriptional errors that result from this process are unintentional.

## 2020-12-08 ENCOUNTER — Encounter: Payer: Self-pay | Admitting: Family Medicine

## 2020-12-08 ENCOUNTER — Ambulatory Visit: Payer: 59 | Admitting: Family Medicine

## 2020-12-08 ENCOUNTER — Other Ambulatory Visit: Payer: Self-pay

## 2020-12-08 VITALS — BP 100/80 | HR 84 | Ht 60.0 in | Wt 137.0 lb

## 2020-12-08 DIAGNOSIS — M41125 Adolescent idiopathic scoliosis, thoracolumbar region: Secondary | ICD-10-CM

## 2020-12-08 DIAGNOSIS — M999 Biomechanical lesion, unspecified: Secondary | ICD-10-CM

## 2020-12-08 NOTE — Assessment & Plan Note (Signed)
scoliosis with exacerbation of pain, less activity with COVID told patient to increase activity as tolerated.  Patient does have a Flexeril for breakthrough pain if necessary at night.  Discussed posture and ergonomics.  Follow-up again 0.8 weeks

## 2020-12-08 NOTE — Patient Instructions (Signed)
Good to see you Try the muscle relaxer at night Try activity as tolerated  See me again in 4-5 weeks

## 2020-12-14 MED FILL — LO LOESTRIN FE 1-10 TABLET: 1 MG-10 MCG | 84 days supply | Qty: 84 | Fill #2

## 2020-12-20 DIAGNOSIS — H5203 Hypermetropia, bilateral: Secondary | ICD-10-CM | POA: Diagnosis not present

## 2020-12-20 DIAGNOSIS — H52223 Regular astigmatism, bilateral: Secondary | ICD-10-CM | POA: Diagnosis not present

## 2021-01-11 NOTE — Progress Notes (Signed)
[facility] [place] Phone: [phone] Subjective:   Fontaine No, am serving as a scribe for Dr. Hulan Saas. This visit occurred during the SARS-CoV-2 public health emergency.  Safety protocols were in place, including screening questions prior to the visit, additional usage of staff PPE, and extensive cleaning of exam room while observing appropriate contact time as indicated for disinfecting solutions.   I'm seeing this patient by the request  of:  Koberlein, Steele Berg, MD  CC: Low back pain follow-up  [id number]  Madison [name] is a 35 y.o. female coming in with complaint of back and neck pain. OMT 12/08/2020. Patient states that she is doing a lot better.  Patient states has not noticed as much pain.  Feels like she has been able to get her energy back.  Has been able to start running and doing a Pilates on a regular basis.  Very minimal discomfort but nothing that stopping her at this time.  Not using any medic  Medications patient has been prescribed: Cyclobenzaprine  Taking: None notable         Reviewed prior external information including notes and imaging from previsou exam, outside providers and external EMR if available.   As well as notes that were available from care everywhere and other healthcare systems.  Past medical history, social, surgical and family history all reviewed in electronic medical record.  No pertanent information unless stated regarding to the chief complaint.   Past Medical History:  Diagnosis Date  . Allergy   . Asthma   . Hx of migraines 01/012003   can tell when they come on, treats with excedrin migraine.  . Scoliosis 10/24/1995  . Vitamin D deficiency     Allergies  Allergen Reactions  . Prednisone     Tingling in hands and face  . Meloxicam     heartburn     Review of Systems:  No headache, visual changes, nausea, vomiting, diarrhea, constipation,  dizziness, abdominal pain, skin rash, fevers, chills, night sweats, weight loss, swollen lymph nodes, joint swelling, chest pain, shortness of breath, mood changes. POSITIVE muscle aches, body aches  Objective  Blood pressure 102/72, pulse (!) 103, height 5' (1.524 m), weight 138 lb (62.6 kg), SpO2 99 %.   General: No apparent distress alert and oriented x3 mood and affect normal, dressed appropriately.  HEENT: Pupils equal, extraocular movements intact  Respiratory: Patient's speak in full sentences and does not appear short of breath  Cardiovascular: No lower extremity edema, non tender, no erythema .  Gait normal with good balance and coordination.  MSK:  Non tender with full range of motion and good stability and symmetric strength and tone of shoulders, elbows, wrist, hip, knee and ankles bilaterally.  Back -back exam significant scoliosis noted in the thoracolumbar as well as the lumbosacral areas.  Patient continues to have some discomfort more in the right scapular region.  Tightness of the lower back and also noted.  Osteopathic findings  T3-6 neutral rotated left side bent right L2 flexed rotated and side bent right Sacrum right on right     Assessment and Plan:    Nonallopathic problems  Decision today to treat with OMT was based on Physical Exam  After verbal consent patient was treated with HVLA, ME, FPR techniques in cervical, rib, thoracic, lumbar, and sacral  areas  Patient tolerated the procedure well with improvement in symptoms  Patient given exercises, stretches and lifestyle  modifications  See medications in patient instructions if given  Patient will follow up in 4-8 weeks      The above documentation has been reviewed and is accurate and complete Lyndal Pulley, DO       Note: This dictation was prepared with Dragon dictation along with smaller phrase technology. Any transcriptional errors that result from this process are unintentional.

## 2021-01-12 ENCOUNTER — Other Ambulatory Visit: Payer: Self-pay

## 2021-01-12 ENCOUNTER — Ambulatory Visit: Payer: 59 | Admitting: Family Medicine

## 2021-01-12 ENCOUNTER — Encounter: Payer: Self-pay | Admitting: Family Medicine

## 2021-01-12 VITALS — BP 102/72 | HR 103 | Ht 60.0 in | Wt 138.0 lb

## 2021-01-12 DIAGNOSIS — M41125 Adolescent idiopathic scoliosis, thoracolumbar region: Secondary | ICD-10-CM | POA: Diagnosis not present

## 2021-01-12 DIAGNOSIS — M999 Biomechanical lesion, unspecified: Secondary | ICD-10-CM

## 2021-01-12 NOTE — Patient Instructions (Signed)
Good to see you Keep it up We will see you before your [place] trip in 6-7 weeks

## 2021-01-12 NOTE — Assessment & Plan Note (Signed)
Patient does have scoliosis but is doing significantly better overall.  Patient is running and doing Pilates.  Very proud of her at this moment.  Discussed posture ergonomics.  Does have muscle relaxer for breakthrough.  Follow-up again in 6 to 8 weeks

## 2021-01-13 ENCOUNTER — Encounter: Payer: Self-pay | Admitting: Physician Assistant

## 2021-01-13 ENCOUNTER — Other Ambulatory Visit: Payer: Self-pay

## 2021-01-13 ENCOUNTER — Ambulatory Visit: Payer: 59 | Admitting: Physician Assistant

## 2021-01-13 DIAGNOSIS — D1801 Hemangioma of skin and subcutaneous tissue: Secondary | ICD-10-CM

## 2021-01-13 DIAGNOSIS — Z1283 Encounter for screening for malignant neoplasm of skin: Secondary | ICD-10-CM

## 2021-01-13 DIAGNOSIS — L905 Scar conditions and fibrosis of skin: Secondary | ICD-10-CM

## 2021-01-20 DIAGNOSIS — Z309 Encounter for contraceptive management, unspecified: Secondary | ICD-10-CM | POA: Diagnosis not present

## 2021-01-20 DIAGNOSIS — Z01419 Encounter for gynecological examination (general) (routine) without abnormal findings: Secondary | ICD-10-CM | POA: Diagnosis not present

## 2021-01-24 ENCOUNTER — Encounter: Payer: Self-pay | Admitting: Physician Assistant

## 2021-01-24 NOTE — Progress Notes (Signed)
   New Patient   Subjective  Madison [name] is a 35 y.o. female who presents for the following: Annual Exam (No concerns).   The following portions of the chart were reviewed this encounter and updated as appropriate:  Tobacco  Allergies  Meds  Problems  Med Hx  Surg Hx  Fam Hx      Objective  Well appearing patient in no apparent distress; mood and affect are within normal limits.  A full examination was performed including scalp, head, eyes, ears, nose, lips, neck, chest, axillae, abdomen, back, buttocks, bilateral upper extremities, bilateral lower extremities, hands, feet, fingers, toes, fingernails, and toenails. All findings within normal limits unless otherwise noted below.  Objective  Left Inguinal Area: Full body skin exam. No atypical moles, no skin cancer.   Objective  Mid Back: Patient has had scoliosis surgery.  Objective  Right Thigh - Anterior: Pink papule   Assessment & Plan  Screening exam for skin cancer Left Inguinal Area  Yearly skin check  Scar Mid Back  observe  Cherry angioma Right Thigh - Anterior  Ok to leave     I, ,, PA-C, have reviewed all documentation for this visit. The documentation on 01/24/21 for the exam, diagnosis, procedures, and orders are all accurate and complete.

## 2021-02-02 ENCOUNTER — Encounter: Payer: Self-pay | Admitting: Family Medicine

## 2021-02-02 ENCOUNTER — Ambulatory Visit (INDEPENDENT_AMBULATORY_CARE_PROVIDER_SITE_OTHER): Payer: 59 | Admitting: Family Medicine

## 2021-02-02 ENCOUNTER — Other Ambulatory Visit (HOSPITAL_BASED_OUTPATIENT_CLINIC_OR_DEPARTMENT_OTHER): Payer: Self-pay

## 2021-02-02 ENCOUNTER — Other Ambulatory Visit: Payer: Self-pay

## 2021-02-02 VITALS — BP 90/60 | HR 62 | Temp 97.5°F | Ht 63.25 in | Wt 139.0 lb

## 2021-02-02 DIAGNOSIS — J302 Other seasonal allergic rhinitis: Secondary | ICD-10-CM

## 2021-02-02 DIAGNOSIS — L509 Urticaria, unspecified: Secondary | ICD-10-CM

## 2021-02-02 DIAGNOSIS — R7989 Other specified abnormal findings of blood chemistry: Secondary | ICD-10-CM

## 2021-02-02 DIAGNOSIS — Z0000 Encounter for general adult medical examination without abnormal findings: Secondary | ICD-10-CM | POA: Diagnosis not present

## 2021-02-02 DIAGNOSIS — J4520 Mild intermittent asthma, uncomplicated: Secondary | ICD-10-CM | POA: Insufficient documentation

## 2021-02-02 DIAGNOSIS — T7840XA Allergy, unspecified, initial encounter: Secondary | ICD-10-CM | POA: Diagnosis not present

## 2021-02-02 DIAGNOSIS — R5383 Other fatigue: Secondary | ICD-10-CM | POA: Diagnosis not present

## 2021-02-02 DIAGNOSIS — K900 Celiac disease: Secondary | ICD-10-CM

## 2021-02-02 DIAGNOSIS — E538 Deficiency of other specified B group vitamins: Secondary | ICD-10-CM

## 2021-02-02 DIAGNOSIS — Z13220 Encounter for screening for lipoid disorders: Secondary | ICD-10-CM

## 2021-02-02 LAB — CBC WITH DIFFERENTIAL/PLATELET
Basophils Absolute: 0 10*3/uL (ref 0.0–0.1)
Basophils Relative: 0.2 % (ref 0.0–3.0)
Eosinophils Absolute: 0.3 10*3/uL (ref 0.0–0.7)
Eosinophils Relative: 3.1 % (ref 0.0–5.0)
HCT: 40.8 % (ref 36.0–46.0)
Hemoglobin: 13.6 g/dL (ref 12.0–15.0)
Lymphocytes Relative: 25 % (ref 12.0–46.0)
Lymphs Abs: 2.3 10*3/uL (ref 0.7–4.0)
MCHC: 33.4 g/dL (ref 30.0–36.0)
MCV: 93 fl (ref 78.0–100.0)
Monocytes Absolute: 0.6 10*3/uL (ref 0.1–1.0)
Monocytes Relative: 6.5 % (ref 3.0–12.0)
Neutro Abs: 5.9 10*3/uL (ref 1.4–7.7)
Neutrophils Relative %: 65.2 % (ref 43.0–77.0)
Platelets: 265 10*3/uL (ref 150.0–400.0)
RBC: 4.39 Mil/uL (ref 3.87–5.11)
RDW: 13.4 % (ref 11.5–15.5)
WBC: 9.1 10*3/uL (ref 4.0–10.5)

## 2021-02-02 LAB — LIPID PANEL
Cholesterol: 181 mg/dL (ref 0–200)
HDL: 55.1 mg/dL (ref >39.00)
LDL Cholesterol: 110 mg/dL — ABNORMAL HIGH (ref 0–99)
NonHDL: 125.96
Total CHOL/HDL Ratio: 3
Triglycerides: 82 mg/dL (ref 0.0–149.0)
VLDL: 16.4 mg/dL (ref 0.0–40.0)

## 2021-02-02 LAB — COMPREHENSIVE METABOLIC PANEL
ALT: 14 U/L (ref 0–35)
AST: 15 U/L (ref 0–37)
Albumin: 4.3 g/dL (ref 3.5–5.2)
Alkaline Phosphatase: 39 U/L (ref 39–117)
BUN: 10 mg/dL (ref 6–23)
CO2: 29 mEq/L (ref 19–32)
Calcium: 9.8 mg/dL (ref 8.4–10.5)
Chloride: 102 mEq/L (ref 96–112)
Creatinine, Ser: 0.67 mg/dL (ref 0.40–1.20)
GFR: 112.8 mL/min (ref >60.00)
Glucose, Bld: 65 mg/dL — ABNORMAL LOW (ref 70–99)
Potassium: 4.4 mEq/L (ref 3.5–5.1)
Sodium: 139 mEq/L (ref 135–145)
Total Bilirubin: 0.5 mg/dL (ref 0.2–1.2)
Total Protein: 7.1 g/dL (ref 6.0–8.3)

## 2021-02-02 LAB — FERRITIN: Ferritin: 73.4 ng/mL (ref 10.0–291.0)

## 2021-02-02 LAB — VITAMIN D 25 HYDROXY (VIT D DEFICIENCY, FRACTURES): VITD: 26.85 ng/mL — ABNORMAL LOW (ref 30.00–100.00)

## 2021-02-02 LAB — VITAMIN B12: Vitamin B-12: 221 pg/mL (ref 211–911)

## 2021-02-02 MED ORDER — MONTELUKAST SODIUM 10 MG PO TABS
10.0000 mg | ORAL_TABLET | Freq: Every day | ORAL | 1 refills | Status: DC
  Filled 2021-02-02: qty 90, 90d supply, fill #0

## 2021-02-02 NOTE — Progress Notes (Signed)
Madison [name] DOB: [birth year] Encounter date: 02/02/2021  This is a 35 y.o. female who presents for complete physical   History of present illness/Additional concerns: She stopped eating gluten about 4 years ago; because she was getting hives. Thought this was over, but has started with hives again. Hasn't changed anything - tide free and clear, neutragena body wash, nothing scented, completely gluten free house; so nothing new. Hives on arm from this morning went away. Just not sure where they come from. Used to get lip swelling, gum swelling and scratchy throat with fruits/veggies. This morning hadn't even eaten before this happened. Last meal was 8pm last night - air popped pop corn. Also had some hives on hands over weekend. Otherwise none in a year.  Last year had a couple of flares - one she tied back to tempura flakes, other no tie.   She did 5 years of allergy shots - which is actually when hives started. Now can eat fruits/veggies without that happening.   At last visit complained of daily fatigue - low normal B12, vitamin D levels.   Allergies/sinus: have been horrible, but hasn't felt like she had infection yet. Not sure if related to fatigue.   Low back pain: has been doing well. Needs new work chair; but might be returning to work.   Asthma: has been doing really well with this. Hasn't used inhaler.   Migraines: hasn't had one in a year and a half.   Follows w gyn - Christophe Louis with [facility]: just saw her 2 weeks ago 77 with derm regularly  Past Medical History:  Diagnosis Date  . Allergy   . Asthma   . Hx of migraines 01/012003   can tell when they come on, treats with excedrin migraine.  . Scoliosis 10/24/1995  . Vitamin D deficiency    Past Surgical History:  Procedure Laterality Date  . partial spinal fusion C7-T2 due to scoliosis 03/1998 N/A 04/11/1998  . TONSILLECTOMY    . WISDOM TOOTH EXTRACTION     Allergies  Allergen Reactions  . Prednisone      Tingling in hands and face  . Meloxicam     heartburn   No outpatient medications have been marked as taking for the 02/02/21 encounter (Office Visit) with Caren Macadam, MD.   Social History   Tobacco Use  . Smoking status: Never Smoker  . Smokeless tobacco: Never Used  Substance Use Topics  . Alcohol use: Yes    Comment: occasionally   Family History  Problem Relation Age of Onset  . Breast cancer Maternal Grandmother 84  . Osteoporosis Maternal Grandmother   . Heart failure Maternal Grandmother   . COPD Maternal Grandmother   . Healthy Mother   . Hearing loss Father   . Other Father        prostate level elevation  . Diabetes Mellitus II Brother        TWIN  . Prostate cancer Maternal Grandfather        w mets  . Heart disease Maternal Grandfather   . Heart failure Maternal Grandfather   . Brain cancer Paternal Grandmother   . Melanoma Paternal Grandfather   . Colon cancer Paternal Aunt      Review of Systems  Constitutional: Negative for activity change, appetite change, chills, fatigue, fever and unexpected weight change.  HENT: Negative for congestion, ear pain, hearing loss, sinus pressure, sinus pain, sore throat and trouble swallowing.   Eyes: Negative for pain and  visual disturbance.  Respiratory: Negative for cough, chest tightness, shortness of breath and wheezing.   Cardiovascular: Negative for chest pain, palpitations and leg swelling.  Gastrointestinal: Negative for abdominal pain, blood in stool, constipation, diarrhea, nausea and vomiting.  Genitourinary: Negative for difficulty urinating and menstrual problem.  Musculoskeletal: Negative for arthralgias and back pain.  Skin: Negative for rash.  Neurological: Negative for dizziness, weakness, numbness and headaches.  Hematological: Negative for adenopathy. Does not bruise/bleed easily.  Psychiatric/Behavioral: Negative for sleep disturbance and suicidal ideas. The patient is not nervous/anxious.      CBC:  Lab Results  Component Value Date   WBC 6.5 02/02/2020   HGB 14.9 02/02/2020   HGB 12.7 06/29/2017   HCT 44.6 02/02/2020   HCT 40.3 06/29/2017   MCH 30.2 06/29/2017   MCHC 33.5 02/02/2020   RDW 13.3 02/02/2020   RDW 13.2 06/29/2017   PLT 230.0 02/02/2020   PLT 275 06/29/2017   CMP: Lab Results  Component Value Date   NA 139 02/02/2020   NA 139 11/02/2017   K 4.4 02/02/2020   CL 103 02/02/2020   CO2 29 02/02/2020   GLUCOSE 88 02/02/2020   BUN 13 02/02/2020   BUN 11 11/02/2017   CREATININE 0.69 02/02/2020   LABGLOB 3.1 11/02/2017   GFRAA 138 11/02/2017   CALCIUM 9.8 02/02/2020   PROT 7.0 02/02/2020   PROT 7.7 11/02/2017   AGRATIO 1.5 11/02/2017   BILITOT 0.3 02/02/2020   BILITOT 0.2 11/02/2017   ALKPHOS 40 02/02/2020   ALT 12 02/02/2020   AST 15 02/02/2020   LIPID: Lab Results  Component Value Date   CHOL 186 02/02/2020   CHOL 157 06/29/2017   TRIG 62.0 02/02/2020   HDL 51.30 02/02/2020   HDL 58 06/29/2017   LDLCALC 123 (H) 02/02/2020   LDLCALC 86 06/29/2017   LABVLDL 13 06/29/2017    Objective:  BP 90/60 (BP Location: Left Arm, Patient Position: Sitting, Cuff Size: Normal)   Pulse 62   Temp (!) 97.5 F (36.4 C) (Oral)   Ht 5' 3.25" (1.607 m)   Wt 139 lb (63 kg)   LMP 10/21/2020 (Exact Date)   SpO2 98%   BMI 24.43 kg/m   Weight: 139 lb (63 kg)   BP Readings from Last 3 Encounters:  02/02/21 90/60  01/12/21 102/72  12/08/20 100/80   Wt Readings from Last 3 Encounters:  02/02/21 139 lb (63 kg)  01/12/21 138 lb (62.6 kg)  12/08/20 137 lb (62.1 kg)    Physical Exam Constitutional:      General: She is not in acute distress.    Appearance: She is well-developed.  HENT:     Head: Normocephalic and atraumatic.     Right Ear: External ear normal.     Left Ear: External ear normal.     Mouth/Throat:     Pharynx: No oropharyngeal exudate.  Eyes:     Conjunctiva/sclera: Conjunctivae normal.     Pupils: Pupils are equal, round, and  reactive to light.  Neck:     Thyroid: No thyromegaly.  Cardiovascular:     Rate and Rhythm: Normal rate and regular rhythm.     Heart sounds: Normal heart sounds. No murmur heard. No friction rub. No gallop.   Pulmonary:     Effort: Pulmonary effort is normal.     Breath sounds: Normal breath sounds.  Abdominal:     General: Bowel sounds are normal. There is no distension.     Palpations: Abdomen  is soft. There is no mass.     Tenderness: There is no abdominal tenderness. There is no guarding.     Hernia: No hernia is present.  Musculoskeletal:        General: No tenderness or deformity. Normal range of motion.     Cervical back: Normal range of motion and neck supple.  Lymphadenopathy:     Cervical: No cervical adenopathy.  Skin:    General: Skin is warm and dry.     Findings: No rash.  Neurological:     Mental Status: She is alert and oriented to person, place, and time.     Deep Tendon Reflexes: Reflexes normal.     Reflex Scores:      Tricep reflexes are 2+ on the right side and 2+ on the left side.      Bicep reflexes are 2+ on the right side and 2+ on the left side.      Brachioradialis reflexes are 2+ on the right side and 2+ on the left side.      Patellar reflexes are 2+ on the right side and 2+ on the left side. Psychiatric:        Speech: Speech normal.        Behavior: Behavior normal.        Thought Content: Thought content normal.     Assessment/Plan: Health Maintenance Due  Topic Date Due  . Hepatitis C Screening  Never done   Health Maintenance reviewed.  1. Preventative health care Keep up with regular exercise and healthy eating.   2. Seasonal allergies Continue with allegra; consider allergy testing if still having issues with hives.   3. Mild intermittent asthma, unspecified whether complicated Well controlled. Continue with albuterol prn.  4. Hives See above  5. Transient gluten sensitivity - Celiac Panel 10; Future  6. Allergy,  initial encounter - Alpha galactosidase; Future - montelukast (SINGULAIR) 10 MG tablet; Take 1 tablet (10 mg total) by mouth at bedtime.  Dispense: 90 tablet; Refill: 1  7. Low serum vitamin B12 - Vitamin B12; Future  8. Low vitamin D level - VITAMIN D 25 Hydroxy (Vit-D Deficiency, Fractures); Future  9. Fatigue, unspecified type - CBC with Differential/Platelet; Future - Comprehensive metabolic panel; Future - Ferritin; Future  10. Lipid screening - Lipid panel; Future  Return in about 1 year (around 02/02/2022) for physical exam.  Micheline Rough, MD

## 2021-02-02 NOTE — Addendum Note (Signed)
Addended by: Elmer Picker on: 02/02/2021 11:34 AM   Modules accepted: Orders

## 2021-02-03 ENCOUNTER — Encounter: Payer: Self-pay | Admitting: Family Medicine

## 2021-02-03 DIAGNOSIS — J302 Other seasonal allergic rhinitis: Secondary | ICD-10-CM

## 2021-02-03 DIAGNOSIS — L509 Urticaria, unspecified: Secondary | ICD-10-CM

## 2021-02-03 DIAGNOSIS — K900 Celiac disease: Secondary | ICD-10-CM

## 2021-02-03 LAB — COMPREHENSIVE METABOLIC PANEL: Albumin: 3.4 — AB (ref 3.5–5.0)

## 2021-02-09 LAB — CELIAC PANEL 10

## 2021-02-09 LAB — ALPHA GALACTOSIDASE: Alpha-Galactosidase activity: 72.9 nmol/hr/mg prt (ref >35.5)

## 2021-02-15 ENCOUNTER — Other Ambulatory Visit: Payer: Self-pay | Admitting: Family Medicine

## 2021-02-15 DIAGNOSIS — Z91018 Allergy to other foods: Secondary | ICD-10-CM

## 2021-02-16 ENCOUNTER — Other Ambulatory Visit: Payer: Self-pay

## 2021-02-16 ENCOUNTER — Other Ambulatory Visit (INDEPENDENT_AMBULATORY_CARE_PROVIDER_SITE_OTHER): Payer: 59

## 2021-02-16 DIAGNOSIS — Z91018 Allergy to other foods: Secondary | ICD-10-CM

## 2021-02-16 DIAGNOSIS — Z7689 Persons encountering health services in other specified circumstances: Secondary | ICD-10-CM | POA: Diagnosis not present

## 2021-02-17 LAB — SPECIMEN STATUS REPORT

## 2021-02-19 LAB — CELIAC PANEL 10
Antigliadin Abs, IgA: 2 units (ref 0–19)
Endomysial IgA: NEGATIVE
Gliadin IgG: 1 units (ref 0–19)
IgA/Immunoglobulin A, Serum: 106 mg/dL (ref 87–352)
Tissue Transglut Ab: 2 U/mL (ref 0–5)
Transglutaminase IgA: <2 U/mL (ref 0–3)

## 2021-02-22 NOTE — Progress Notes (Signed)
[facility] [address] [place] Phone: [phone] Subjective:   I Madison [name] am serving as a Education administrator for Dr. Hulan Saas.  This visit occurred during the SARS-CoV-2 public health emergency.  Safety protocols were in place, including screening questions prior to the visit, additional usage of staff PPE, and extensive cleaning of exam room while observing appropriate contact time as indicated for disinfecting solutions.   I'm seeing this patient by the request  of:  Caren Macadam, MD  CC: Back pain follow-up  [id number]  Datha Kissinger is a 35 y.o. female coming in with complaint of back  pain. OMT 01/12/2021.  Patient states her lower back is feeling better. Inbetween shoulder blades she has had pain that radiates to her neck. Started about 2 weeks ago.  Patient states it slowly gotten better over the course of the last week.  She does not know exactly what is potentially contributory.  Medications patient has been prescribed: None          Reviewed prior external information including notes and imaging from previsou exam, outside providers and external EMR if available.   As well as notes that were available from care everywhere and other healthcare systems.  Past medical history, social, surgical and family history all reviewed in electronic medical record.  No pertanent information unless stated regarding to the chief complaint.   Past Medical History:  Diagnosis Date  . Allergy   . Asthma   . Hx of migraines 01/012003   can tell when they come on, treats with excedrin migraine.  . Scoliosis 10/24/1995  . Vitamin D deficiency     Allergies  Allergen Reactions  . Prednisone     Tingling in hands and face  . Meloxicam     heartburn     Review of Systems:  No headache, visual changes, nausea, vomiting, diarrhea, constipation, dizziness, abdominal pain, skin rash, fevers, chills, night sweats, weight  loss, swollen lymph nodes, body aches, joint swelling, chest pain, shortness of breath, mood changes. POSITIVE muscle aches  Objective  Blood pressure 114/70, pulse 76, height 5' 3"  (1.6 m), weight 139 lb (63 kg), SpO2 100 %.   General: No apparent distress alert and oriented x3 mood and affect normal, dressed appropriately.  HEENT: Pupils equal, extraocular movements intact  Respiratory: Patient's speak in full sentences and does not appear short of breath  Cardiovascular: No lower extremity edema, non tender, no erythema  Gait normal with good balance and coordination.  MSK:  Non tender with full range of motion and good stability and symmetric strength and tone of shoulders, elbows, wrist, hip, knee and ankles bilaterally.  Back -back exam significant scoliosis noted.  Patient does have tightness noted more in the thoracolumbar juncture than usual.  Patient does have also tightness around the left sacroiliac joint which is different than usual.  Mild positive FABER test on that side.  Mild limited rotation of the thoracic spine noted more than usual.  Osteopathic findings  T3-6 neutral rotated left and side bent right inhaled rib T9 extended rotated and side bent left L2 flexed rotated and side bent right Sacrum left on left       Assessment and Plan:  Scoliosis Patient has what appears to be more of a tightness noted in the thoracolumbar juncture.  Some of it seems to be more in the hip flexor as well as some of the spinalis muscle.  Discussed with patient about  different range of motion in the thoracic rotation that could be beneficial.  Patient will be traveling.  Follow-up with me again in 6 weeks    Nonallopathic problems  Decision today to treat with OMT was based on Physical Exam  After verbal consent patient was treated with HVLA, ME, FPR techniques in  rib, thoracic, lumbar, and sacral  areas  Patient tolerated the procedure well with improvement in symptoms  Patient  given exercises, stretches and lifestyle modifications  See medications in patient instructions if given  Patient will follow up in 4-8 weeks      The above documentation has been reviewed and is accurate and complete Lyndal Pulley, DO       Note: This dictation was prepared with Dragon dictation along with smaller phrase technology. Any transcriptional errors that result from this process are unintentional.

## 2021-02-23 ENCOUNTER — Ambulatory Visit: Payer: 59 | Admitting: Family Medicine

## 2021-02-23 ENCOUNTER — Other Ambulatory Visit: Payer: Self-pay | Admitting: Family Medicine

## 2021-02-23 ENCOUNTER — Encounter: Payer: Self-pay | Admitting: Family Medicine

## 2021-02-23 ENCOUNTER — Other Ambulatory Visit: Payer: Self-pay

## 2021-02-23 VITALS — BP 114/70 | HR 76 | Ht 63.0 in | Wt 139.0 lb

## 2021-02-23 DIAGNOSIS — M9903 Segmental and somatic dysfunction of lumbar region: Secondary | ICD-10-CM | POA: Diagnosis not present

## 2021-02-23 DIAGNOSIS — M9902 Segmental and somatic dysfunction of thoracic region: Secondary | ICD-10-CM | POA: Diagnosis not present

## 2021-02-23 DIAGNOSIS — M41125 Adolescent idiopathic scoliosis, thoracolumbar region: Secondary | ICD-10-CM | POA: Diagnosis not present

## 2021-02-23 DIAGNOSIS — M9904 Segmental and somatic dysfunction of sacral region: Secondary | ICD-10-CM

## 2021-02-23 NOTE — Patient Instructions (Addendum)
Good to see you Think the back flare was a fluke [place] See me again in 6 weeks

## 2021-02-23 NOTE — Assessment & Plan Note (Signed)
Patient has what appears to be more of a tightness noted in the thoracolumbar juncture.  Some of it seems to be more in the hip flexor as well as some of the spinalis muscle.  Discussed with patient about different range of motion in the thoracic rotation that could be beneficial.  Patient will be traveling.  Follow-up with me again in 6 weeks

## 2021-02-26 LAB — O215-IGE ALPHA-GAL: O215-IgE Alpha-Gal: <0.1 kU/L

## 2021-02-26 LAB — SPECIMEN STATUS REPORT

## 2021-03-02 ENCOUNTER — Other Ambulatory Visit: Payer: Self-pay | Admitting: Obstetrics and Gynecology

## 2021-03-02 ENCOUNTER — Other Ambulatory Visit (HOSPITAL_COMMUNITY): Payer: Self-pay

## 2021-03-03 ENCOUNTER — Other Ambulatory Visit (HOSPITAL_COMMUNITY): Payer: Self-pay

## 2021-03-04 ENCOUNTER — Other Ambulatory Visit (HOSPITAL_COMMUNITY): Payer: Self-pay

## 2021-03-08 ENCOUNTER — Other Ambulatory Visit (HOSPITAL_COMMUNITY): Payer: Self-pay

## 2021-03-08 ENCOUNTER — Other Ambulatory Visit: Payer: Self-pay | Admitting: Obstetrics and Gynecology

## 2021-03-08 MED ORDER — LO LOESTRIN FE 1 MG-10 MCG / 10 MCG PO TABS
ORAL_TABLET | ORAL | 3 refills | Status: DC
  Filled 2021-03-08: qty 84, 84d supply, fill #0
  Filled 2021-05-27: qty 84, 84d supply, fill #1

## 2021-03-09 ENCOUNTER — Other Ambulatory Visit (HOSPITAL_COMMUNITY): Payer: Self-pay

## 2021-03-14 ENCOUNTER — Encounter: Payer: Self-pay | Admitting: Family Medicine

## 2021-03-23 ENCOUNTER — Telehealth: Payer: 59 | Admitting: Physician Assistant

## 2021-03-23 ENCOUNTER — Other Ambulatory Visit (HOSPITAL_BASED_OUTPATIENT_CLINIC_OR_DEPARTMENT_OTHER): Payer: Self-pay

## 2021-03-23 DIAGNOSIS — B9689 Other specified bacterial agents as the cause of diseases classified elsewhere: Secondary | ICD-10-CM

## 2021-03-23 DIAGNOSIS — J329 Chronic sinusitis, unspecified: Secondary | ICD-10-CM | POA: Diagnosis not present

## 2021-03-23 MED ORDER — AMOXICILLIN-POT CLAVULANATE 875-125 MG PO TABS
1.0000 | ORAL_TABLET | Freq: Two times a day (BID) | ORAL | 0 refills | Status: DC
  Filled 2021-03-23: qty 14, 7d supply, fill #0

## 2021-03-23 NOTE — Progress Notes (Signed)
We are sorry that you are not feeling well.  Here is how we plan to help!  Based on what you have shared with me it looks like you have sinusitis.  Sinusitis is inflammation and infection in the sinus cavities of the head.  Based on your presentation I believe you most likely have Acute Bacterial Sinusitis.  This is an infection caused by bacteria and is treated with antibiotics. I have prescribed Augmentin 878m/125mg one tablet twice daily with food, for 7 days. You may use an oral decongestant such as Mucinex D or if you have glaucoma or high blood pressure use plain Mucinex. Saline nasal spray help and can safely be used as often as needed for congestion.  If you develop worsening sinus pain, fever or notice severe headache and vision changes, or if symptoms are not better after completion of antibiotic, please schedule an appointment with a health care provider.    Sinus infections are not as easily transmitted as other respiratory infection, however we still recommend that you avoid close contact with loved ones, especially the very young and elderly.  Remember to wash your hands thoroughly throughout the day as this is the number one way to prevent the spread of infection!  Home Care:  Only take medications as instructed by your medical team.  Complete the entire course of an antibiotic.  Do not take these medications with alcohol.  A steam or ultrasonic humidifier can help congestion.  You can place a towel over your head and breathe in the steam from hot water coming from a faucet.  Avoid close contacts especially the very young and the elderly.  Cover your mouth when you cough or sneeze.  Always remember to wash your hands.  Get Help Right Away If:  You develop worsening fever or sinus pain.  You develop a severe head ache or visual changes.  Your symptoms persist after you have completed your treatment plan.  Make sure you  Understand these instructions.  Will watch your  condition.  Will get help right away if you are not doing well or get worse.  Your e-visit answers were reviewed by a board certified advanced clinical practitioner to complete your personal care plan.  Depending on the condition, your plan could have included both over the counter or prescription medications.  If there is a problem please reply  once you have received a response from your provider.  Your safety is important to [place]  If you have drug allergies check your prescription carefully.    You can use MyChart to ask questions about today's visit, request a non-urgent call back, or ask for a work or school excuse for 24 hours related to this e-Visit. If it has been greater than 24 hours you will need to follow up with your provider, or enter a new e-Visit to address those concerns.  You will get an e-mail in the next two days asking about your experience.  I hope that your e-visit has been valuable and will speed your recovery. Thank you for using e-visits.  I provided 5 minutes of non face-to-face time during this encounter for chart review and documentation.

## 2021-04-05 NOTE — Progress Notes (Signed)
[facility] [address] [place] Phone: [phone] Subjective:   I [name] am serving as a Education administrator for Dr. Hulan Saas.  This visit occurred during the SARS-CoV-2 public health emergency.  Safety protocols were in place, including screening questions prior to the visit, additional usage of staff PPE, and extensive cleaning of exam room while observing appropriate contact time as indicated for disinfecting solutions.   I'm seeing this patient by the request  of:  Koberlein, Steele Berg, MD  CC: neck back pain   [id number]  Geniece Akers is a 35 y.o. female coming in with complaint of back and neck pain. OMT 02/23/2021. Patient states her neck is tight on the right side. Right hip feels rotated.  Patient recently traveled and thinks that this could have contributed to it.  Has been [place].  Doing relatively well overall.  Medications patient has been prescribed: None  Taking:         Reviewed prior external information including notes and imaging from previsou exam, outside providers and external EMR if available.   As well as notes that were available from care everywhere and other healthcare systems.  Past medical history, social, surgical and family history all reviewed in electronic medical record.  No pertanent information unless stated regarding to the chief complaint.   Past Medical History:  Diagnosis Date   Allergy    Asthma    Hx of migraines 01/012003   can tell when they come on, treats with excedrin migraine.   Scoliosis 10/24/1995   Vitamin D deficiency     Allergies  Allergen Reactions   Prednisone     Tingling in hands and face   Meloxicam     heartburn     Review of Systems:  No headache, visual changes, nausea, vomiting, diarrhea, constipation, dizziness, abdominal pain, skin rash, fevers, chills, night sweats, weight loss, swollen lymph nodes, body aches, joint swelling,  chest pain, shortness of breath, mood changes. POSITIVE muscle aches  Objective  Blood pressure 104/82, pulse 79, height 5' 3"  (1.6 m), weight 138 lb (62.6 kg), SpO2 99 %.   General: No apparent distress alert and oriented x3 mood and affect normal, dressed appropriately.  HEENT: Pupils equal, extraocular movements intact  Respiratory: Patient's speak in full sentences and does not appear short of breath  Cardiovascular: No lower extremity edema, non tender, no erythema  Gait normal with good balance and coordination.  MSK:  Non tender with full range of motion and good stability and symmetric strength and tone of shoulders, elbows, wrist, hip, knee and ankles bilaterally.  Back -exam still shows the patient does have scoliosis.  No significant changes.  Continue to him paraspinal musculature and from the thoracolumbar juncture down.  No spinous process tenderness noted.  Tightness noted to right trapezius.  Osteopathic findings  T4-6 neutral rotated left side bent right with inhaled rib at T4 T9 extended rotated and side bent left L1 flexed rotated and side bent right L4 flexed rotated and side bent left Sacrum left on left Right anterior ilium      Assessment and Plan:  Scoliosis Patient has been doing remarkably well overall.  Discussed icing regimen and home exercises, discussed continuing to increase activity.  We discussed icing regimen.  Follow-up with me again 6 to 8 weeks.  Patient does have Flexeril needed for any breakthrough.   Nonallopathic problems  Decision today to treat with OMT was based on  Physical Exam  After verbal consent patient was treated with HVLA, ME, FPR techniques in  rib, thoracic, lumbar, and sacral  areas  Patient tolerated the procedure well with improvement in symptoms  Patient given exercises, stretches and lifestyle modifications  See medications in patient instructions if given  Patient will follow up in 4-8 weeks      The above  documentation has been reviewed and is accurate and complete Lyndal Pulley, DO        Note: This dictation was prepared with Dragon dictation along with smaller phrase technology. Any transcriptional errors that result from this process are unintentional.

## 2021-04-06 ENCOUNTER — Ambulatory Visit: Payer: 59 | Admitting: Family Medicine

## 2021-04-06 ENCOUNTER — Other Ambulatory Visit: Payer: Self-pay

## 2021-04-06 ENCOUNTER — Encounter: Payer: Self-pay | Admitting: Family Medicine

## 2021-04-06 VITALS — BP 104/82 | HR 79 | Ht 63.0 in | Wt 138.0 lb

## 2021-04-06 DIAGNOSIS — M9904 Segmental and somatic dysfunction of sacral region: Secondary | ICD-10-CM | POA: Diagnosis not present

## 2021-04-06 DIAGNOSIS — M41125 Adolescent idiopathic scoliosis, thoracolumbar region: Secondary | ICD-10-CM | POA: Diagnosis not present

## 2021-04-06 DIAGNOSIS — M9903 Segmental and somatic dysfunction of lumbar region: Secondary | ICD-10-CM | POA: Diagnosis not present

## 2021-04-06 DIAGNOSIS — M9902 Segmental and somatic dysfunction of thoracic region: Secondary | ICD-10-CM | POA: Diagnosis not present

## 2021-04-06 NOTE — Patient Instructions (Addendum)
Good to see you Stay active  See me again in 6-8 weeks

## 2021-04-06 NOTE — Assessment & Plan Note (Signed)
Patient has been doing remarkably well overall.  Discussed icing regimen and home exercises, discussed continuing to increase activity.  We discussed icing regimen.  Follow-up with me again 6 to 8 weeks.  Patient does have Flexeril needed for any breakthrough.

## 2021-04-14 ENCOUNTER — Telehealth: Payer: 59 | Admitting: Family Medicine

## 2021-04-14 DIAGNOSIS — R0981 Nasal congestion: Secondary | ICD-10-CM

## 2021-04-14 NOTE — Progress Notes (Signed)
Virtual Visit via Video Note  I connected with Madison [name]  on 04/14/21 at 12:40 PM EDT by a video enabled telemedicine application and verified that I am speaking with the correct person using two identifiers.  Location patient: home, [place] Location provider:work or home office Persons participating in the virtual visit: patient, provider, patient's husband  I discussed the limitations of evaluation and management by telemedicine and the availability of in person appointments. The patient expressed understanding and agreed to proceed.   HPI:  Acute telemedicine visit for sinus issues: -Onset: 3 days ago -Symptoms include: nasal congestion, sinus discomfort, pnd, low grade temp - she reports 98.6 high, which she feels is a fever for her as tends to run lower -Denies:NVD, CP, SOB, inability to eat/drink/get out of bed, known sick contacts -Pertinent past medical history: sinus infection a few weeks ago which resolved with an antibiotic -Pertinent medication allergies:  Allergies  Allergen Reactions   Prednisone     Tingling in hands and face   Meloxicam     heartburn  -COVID-19 vaccine status: 2 doses and a booster, had covid in January  ROS: See pertinent positives and negatives per HPI.  Past Medical History:  Diagnosis Date   Allergy    Asthma    Hx of migraines 01/012003   can tell when they come on, treats with excedrin migraine.   Scoliosis 10/24/1995   Vitamin D deficiency     Past Surgical History:  Procedure Laterality Date   partial spinal fusion C7-T2 due to scoliosis 03/1998 N/A 04/11/1998   TONSILLECTOMY     WISDOM TOOTH EXTRACTION       Current Outpatient Medications:    albuterol (VENTOLIN HFA) 108 (90 Base) MCG/ACT inhaler, Inhale 2 puffs into the lungs every 6 (six) hours as needed for up to 10 days., Disp: 18 g, Rfl: 2   amoxicillin-clavulanate (AUGMENTIN) 875-125 MG tablet, Take 1 tablet by mouth 2 (two) times daily., Disp: 14 tablet, Rfl: 0    cyclobenzaprine (FLEXERIL) 5 MG tablet, Take 1-2 tablets (5-10 mg total) by mouth at bedtime as needed for muscle spasms., Disp: 30 tablet, Rfl: 1   fexofenadine (ALLEGRA) 30 MG tablet, Take 30 mg by mouth daily. , Disp: , Rfl:    montelukast (SINGULAIR) 10 MG tablet, Take 1 tablet (10 mg total) by mouth at bedtime., Disp: 90 tablet, Rfl: 1   Norethindrone-Ethinyl Estradiol-Fe Biphas (LO LOESTRIN FE) 1 MG-10 MCG / 10 MCG tablet, Take 1 tablet by mouth daily., Disp: 3 Package, Rfl: 4   Norethindrone-Ethinyl Estradiol-Fe Biphas (LO LOESTRIN FE) 1 MG-10 MCG / 10 MCG tablet, TAKE 1 TABLET BY MOUTH ONCE A DAY, Disp: 84 tablet, Rfl: 2   Norethindrone-Ethinyl Estradiol-Fe Biphas (LO LOESTRIN FE) 1 MG-10 MCG / 10 MCG tablet, Take 1 tablet by mouth once a day, Disp: 84 tablet, Rfl: 3   Probiotic Product (PROBIOTIC PO), Take by mouth., Disp: , Rfl:    Spacer/Aero-Holding Chambers (AEROCHAMBER PLUS WITH MASK) inhaler, Use as directed., Disp: 1 each, Rfl: 0  EXAM:  VITALS per patient if applicable:  GENERAL: alert, oriented, appears well and in no acute distress  HEENT: atraumatic, conjunttiva clear, no obvious abnormalities on inspection of external nose and ears  NECK: normal movements of the head and neck  LUNGS: on inspection no signs of respiratory distress, breathing rate appears normal, no obvious gross SOB, gasping or wheezing  CV: no obvious cyanosis  MS: moves all visible extremities without noticeable abnormality  PSYCH/NEURO: pleasant and  cooperative, no obvious depression or anxiety, speech and thought processing grossly intact  ASSESSMENT AND PLAN:  Discussed the following assessment and plan:  Nasal sinus congestion  -we discussed possible serious and likely etiologies, options for evaluation and workup, limitations of telemedicine visit vs in person visit, treatment, treatment risks and precautions. Pt prefers to treat via telemedicine empirically rather than in person at this  moment.  Query acute upper respiratory viral infection, COVID-19, allergies versus other.  Opted for COVID testing, which she agrees to do at home with home testing, nasal saline twice daily, and other care measures summarized in patient instructions. Work/School slipped offered:  declined Advised to seek prompt follow-up or in person care if worsening, new symptoms arise, or if is not improving with treatment.   I discussed the assessment and treatment plan with the patient. The patient was provided an opportunity to ask questions and all were answered. The patient agreed with the plan and demonstrated an understanding of the instructions.     Lucretia Kern, DO

## 2021-04-14 NOTE — Patient Instructions (Signed)
  HOME CARE TIPS:  -[place] testing information: [email or web address]/ OR [phone] Most pharmacies also offer testing and home test kits. If the Covid19 test is positive, please make a prompt follow up visit with your primary care office or with [facility] to discuss treatment options. Treatments for Covid19 are best given early in the course of the illness.   -can use tylenol or aleve if needed for fevers, aches and pains per instructions  -can use nasal saline a few times per day if you have nasal congestion; sometimes  a short course of Afrin nasal spray for 3 days can help with symptoms as well  -stay hydrated, drink plenty of fluids and eat small healthy meals - avoid dairy  -can take 1000 IU (70mg) Vit D3 and 100-500 mg of Vit C daily per instructions  -If the Covid test is positive, check out the [facility] for more information on home care, transmission and treatment for COVID19  -follow up with your doctor in 2-3 days unless improving and feeling better  -stay home while sick, except to seek medical care. If you have COVID19, ideally it would be best to stay home for a full 10 days since the onset of symptoms PLUS one day of no fever and feeling better. Wear a good mask that fits snugly (such as N95 or KN95) if around others to reduce the risk of transmission.  It was nice to meet you today, and I really hope you are feeling better soon. I help [facility] out with telemedicine visits on Tuesdays and Thursdays and am available for visits on those days. If you have any concerns or questions following this visit please schedule a follow up visit with your Primary Care doctor or seek care at a local urgent care clinic to avoid delays in care.    Seek in person care or schedule a follow up video visit promptly if your symptoms worsen, new concerns arise or you are not improving with treatment. Call 911 and/or seek emergency care if your  symptoms are severe or life threatening.

## 2021-05-02 ENCOUNTER — Encounter: Payer: Self-pay | Admitting: Family Medicine

## 2021-05-10 NOTE — Progress Notes (Addendum)
New Patient Note  RE: Madison [name] MRN: [id number] DOB: 25-Jan-1986 Date of Office Visit: 05/11/2021  Consult requested by: Caren Macadam, MD Primary care provider: Caren Macadam, MD  Chief Complaint: Asthma (Says intermit throughout her life. If she is on her allergy medications she is fine. Believes it is allergy induces asthma.), Urticaria (Unsure why or how they come about. Started 8 years ago. Was on allergy injection 5 of the 8 years. Though it was gluten that caused the hives and she cut gluten out completely. Blood test confirmed no gluten allergy. While not consuming gluten she missed a day of her antihistamines and her arm began to hive and swell. Says the hives are all over the body, itch, and burn. She has phots. Currently has hives on her left wrist.), Allergic Rhinitis  (Tested in 2011-12. Year round allergies. January seems to be the only month she is not bothered.), and Food Intolerance (Kiwi reaction end of May. Lips were itching, tingling, and swollen.)  History of Present Illness: I had the pleasure of seeing Shamel Galyean for initial evaluation at the [facility] on 05/11/2021. She is a 35 y.o. female, who is referred here by Caren Macadam, MD for the evaluation of hives, allergic rhinitis, asthma and food allergies.  Rash: Rash started about 8 years ago. This can occur anywhere on her body and this can occur throughout the month. Symptoms worse since off antihistamines. Describes them as itchy, painful, raised, erythematous. Individual rashes lasts about a few hours. No ecchymosis upon resolution. Associated symptoms include: none. Suspected triggers are unknown. Denies any fevers, chills, changes in medications, foods, personal care products or recent infections. She has tried the following therapies: benadryl, allegra  with some benefit. Zyrtec caused worsening PND. Systemic steroids none. Currently on allegra.  Previous work up  includes: 2021 ANA and TSH;  2022 CBC diff and CMP unremarkable. Eos 300. Patient is up to date with the following cancer screening tests: physical exam, pap smears.  Reviewed images on the phone - erythematous rash on the face and hairline, abdominal area consistent with urticaria.   Rhinitis: She reports symptoms of itchy eyes, sneezing, rhinorrhea, PND, sore throat. Symptoms have been going on for 30+ years. The symptoms are present all year around with worsening in spring and fall. Anosmia: no. Headache: yes. She has used allegra, saline rinse with some improvement in symptoms. Sinus infections: yes. Previous work up includes: skin testing in 2012 positive to trees, weed pollen, grass, dust mites, mold, cat, dog. Patient had 5 years of AIT with some benefit - last injection around 2018.   Previous ENT evaluation: yes. Previous sinus imaging: not recently. History of nasal polyps: no. Last eye exam: February 2022. History of reflux: only with NSAIDs.  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 Patient had perioral tingling, throat itching within minutes and symptoms resolved after benadryl. No prior issues with kiwi. Tolerates bananas, avocado and latex with no issues.   Past work up includes: none. Dietary History: patient has been eating other foods including milk, eggs, peanut, treenuts, sesame, shellfish, fish, soy, wheat, meats, fruits and vegetables.  She reports reading labels and avoiding kiwi in diet completely.   Asthma: She reports symptoms of chest tightness, shortness of breath, wheezing for 30+ years. Current medications include albuterol prn which help. She reports using aerochamber with inhalers. She tried the following inhalers: none. Main triggers are allergies. In  the last month, frequency of symptoms: 0x/week. Frequency of nocturnal symptoms: 0x/month. Frequency of SABA use: 0x/week. Interference with physical activity: no. Sleep is  undisturbed. In the last 12 months, emergency room visits/urgent care visits/doctor office visits or hospitalizations due to respiratory issues: no. In the last 12 months, oral steroids courses: no. Lifetime history of hospitalization for respiratory issues: no. Prior intubations: no. History of pneumonia: no. She was not evaluated by allergist/pulmonologist in the past. Smoking exposure: no. Up to date with flu vaccine: yes. Up to date with COVID-19 vaccine: yes. Prior Covid-19 infection: yes in January  2022.  Assessment and Plan: Angelli is a 35 y.o. female with: Urticaria Breaking out in pruritic rash for the past 8 years.  Episodes lasts a few hours at a time and describes them as itchy and red.  Antihistamines do seem to help.  Zyrtec caused worsening PND.  In the past ANA, TSH, CBC differential and CMP were unremarkable.  Try to avoid gluten with no benefit. 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 Discussed with patient, that urticaria is usually caused by release of histamine by cutaneous mast cells but sometimes it is non-histamine mediated. Explained that urticaria is not always associated with allergies. In most cases, the exact etiology for urticaria can not be established and it is considered idiopathic. Start allegra 125m twice a day. If rash is not controlled or causes drowsiness let uKoreaknow. Start Pepcid (famotidine) 216mtwice a day.  Avoid the following potential triggers: alcohol, tight clothing, NSAIDs, hot showers and getting overheated. Get bloodwork to rule out other etiologies.   Other allergic rhinitis Perennial rhinoconjunctivitis symptoms for the past 30+ years with worse in the spring and fall.  Patient was on AIT for 5 years by ENT with some benefit. Today's skin prick testing showed: Positive to grass, trees, mold. No intradermal testing was done as patient had sensitive skin. Will get bloodwork instead.  Start environmental control  measures as below. Use over the counter antihistamines such as Zyrtec (cetirizine), Claritin (loratadine), Allegra (fexofenadine), or Xyzal (levocetirizine) daily as needed. May take twice a day during allergy flares. May switch antihistamines every few months. Nasal saline spray (i.e., Simply Saline) is recommended as needed. May use over the counter eye drops as needed.   Other adverse food reactions, not elsewhere classified, subsequent encounter Noted some perioral pruritus after eating kiwi in May 2022.  Symptoms resolved after taking Benadryl.  No issues with bananas, avocados or latex.  Patient eats soy and fish with no issues. Today's skin testing showed: Borderline to soy and fish mix. Continue to avoid kiwi. No skin testing available for kiwi so will get bloodwork instead. Okay to eat fish and soy for now. Will get bloodwork for this as well.  For mild symptoms you can take over the counter antihistamines such as Benadryl and monitor symptoms closely. If symptoms worsen or if you have severe symptoms including breathing issues, throat closure, significant swelling, whole body hives, severe diarrhea and vomiting, lightheadedness then seek immediate medical care.  Mild intermittent asthma Diagnosed with asthma over 30 years ago and main triggers are allergies.  No inhaler use since January 2022.  Today's spirometry showed: mild restrictive disease with no improvement in FEV1 post bronchodilator treatment. Clinically feeling unchanged.  Daily controller medication(s): none May use albuterol rescue inhaler 2 puffs every 4 to 6 hours as needed for shortness of breath, chest tightness, coughing, and wheezing. May use albuterol rescue inhaler 2 puffs  5 to 15 minutes prior to strenuous physical activities. Monitor frequency of use.   Return in about 2 months (around 07/12/2021).  Meds ordered this encounter  Medications  . famotidine (PEPCID) 20 MG tablet    Sig: Take 1 tablet (20 mg total)  by mouth 2 (two) times daily.    Dispense:  60 tablet    Refill:  5  . albuterol (VENTOLIN HFA) 108 (90 Base) MCG/ACT inhaler    Sig: Inhale 2 puffs into the lungs every 4 (four) hours as needed for wheezing or shortness of breath.    Dispense:  8.5 g    Refill:  2    Lab Orders  Alpha-Gal Panel  Chronic Urticaria  C-reactive protein  Tryptase  Sedimentation rate  Thyroid Cascade Profile  Allergen, Kiwi Fruit, f84  Allergen Profile, Food-Fish  Soybean IgE  Allergens w/Total IgE Area 2    Other allergy screening: Medication allergy: yes Prednisone - burning/tingling in hands/face once.  Hymenoptera allergy: no History of recurrent infections suggestive of immunodeficency: no  Diagnostics: Spirometry:  Tracings reviewed. Her effort: It was hard to get consistent efforts and there is a question as to whether this reflects a maximal maneuver. FVC: 2.04L FEV1: 1.80L, 60% predicted FEV1/FVC ratio: 88% Interpretation: Spirometry consistent with possible restrictive disease with no improvement in FEV1 post bronchodilator treatment. Clinically feeling unchanged.   Please see scanned spirometry results for details.  Skin Testing: Environmental allergy panel and select foods. Positive to grass, trees, mold. Borderline to soy and fish mix. Results discussed with patient/family.  Airborne Adult Perc - 05/11/21 1006     Time Antigen Placed 1006    Allergen Manufacturer Lavella Hammock    Location Back    Number of Test 59    1. Control-Buffer 50% Glycerol Negative    2. Control-Histamine 1 mg/ml 2+    3. Albumin saline Negative    4. Kirwin Negative    5. [place] 2+    6. Johnson Negative    7. [place] Blue Negative    8. Meadow Fescue Negative    9. Perennial Rye Negative    10. Sweet Vernal Negative    11. Timothy Negative    12. Cocklebur Negative    13. Burweed Marshelder Negative    14. Ragweed, short Negative    15. Ragweed, Giant Negative    16. Plantain,  English  Negative    17. Lamb's Quarters Negative    18. Sheep Sorrell Negative    19. Rough Pigweed Negative    20. Marsh Elder, Rough Negative    21. Mugwort, Common Negative    22. Ash mix Negative    23. Birch mix Negative    24. Beech American 3+    25. Box, Elder 2+    26. Cedar, red Negative    27. Cottonwood, [place] Negative    28. Elm mix Negative    29. Hickory 4+    30. Maple mix 3+    31. Oak, [place] mix Negative    32. Pecan Pollen Negative    33. Pine mix Negative    34. Sycamore Eastern Negative    35. [place], Black Pollen Negative    36. Alternaria alternata Negative    37. Cladosporium Herbarum Negative    38. Aspergillus mix Negative    39. Penicillium mix Negative    40. Bipolaris sorokiniana (Helminthosporium) Negative    41. Drechslera spicifera (Curvularia) Negative    42. Mucor plumbeus Negative  43. Fusarium moniliforme Negative    44. Aureobasidium pullulans (pullulara) Negative    45. Rhizopus oryzae Negative    46. Botrytis cinera 2+    47. Epicoccum nigrum Negative    48. Phoma betae 2+    49. Candida Albicans Negative    50. Trichophyton mentagrophytes Negative    51. Mite, D Farinae  5,000 AU/ml Negative    52. Mite, D Pteronyssinus  5,000 AU/ml Negative    53. Cat Hair 10,000 BAU/ml Negative    54.  Dog Epithelia Negative    55. Mixed Feathers Negative    56. Horse Epithelia Negative    57. Cockroach, German Negative    58. Mouse Negative    59. Tobacco Leaf Negative    Comments n             Food Perc - 05/11/21 1006       Test Information   Time Antigen Placed 1006    Allergen Manufacturer Lavella Hammock    Location Back    Number of allergen test 10    Food Select      Food   1. Peanut Negative    2. Soybean food --   +/-   3. Wheat, whole Negative    4. Sesame Negative    5. Milk, cow Negative    6. Egg White, chicken Negative    7. Casein Negative    8. Shellfish mix Negative    9. Fish mix --   +/-   10. Cashew Negative              Past Medical History: Patient Active Problem List   Diagnosis Date Noted  . Urticaria 05/11/2021  . Other adverse food reactions, not elsewhere classified, subsequent encounter 05/11/2021  . Other allergic rhinitis 05/11/2021  . Mild intermittent asthma 02/02/2021  . Trigger point of right shoulder region 04/07/2020  . Nonallopathic lesion of lumbosacral region 11/07/2018  . Nonallopathic lesion of sacral region 11/07/2018  . Nonallopathic lesion of thoracic region 11/07/2018  . 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 Volar plate injury of finger 01/22/2018  . Scoliosis 08/21/2017  . Seasonal allergies 02/15/2017   Past Medical History:  Diagnosis Date  . Allergy   . Asthma   . Hx of migraines 01/012003   can tell when they come on, treats with excedrin migraine.  . Scoliosis 10/24/1995  . Vitamin D deficiency    Past Surgical History: Past Surgical History:  Procedure Laterality Date  . partial spinal fusion C7-T2 due to scoliosis 03/1998 N/A 04/11/1998  . TONSILLECTOMY    . WISDOM TOOTH EXTRACTION     Medication List:  Current Outpatient Medications  Medication Sig Dispense Refill  . albuterol (VENTOLIN HFA) 108 (90 Base) MCG/ACT inhaler Inhale 2 puffs into the lungs every 4 (four) hours as needed for wheezing or shortness of breath. 8.5 g 2  . cyclobenzaprine (FLEXERIL) 5 MG tablet Take 1-2 tablets (5-10 mg total) by mouth at bedtime as needed for muscle spasms. 30 tablet 1  . famotidine (PEPCID) 20 MG tablet Take 1 tablet (20 mg total) by mouth 2 (two) times daily. 60 tablet 5  . fexofenadine (ALLEGRA) 30 MG tablet Take 30 mg by mouth daily.     . Multiple Vitamin (MULTIVITAMIN WITH MINERALS) TABS tablet Take 1 tablet by mouth daily.    . Norethindrone-Ethinyl Estradiol-Fe Biphas (LO LOESTRIN FE) 1 MG-10 MCG / 10 MCG tablet Take 1 tablet by mouth once a day 84  tablet 3  . Probiotic Product (PROBIOTIC PO) Take by mouth.    . Spacer/Aero-Holding  Chambers (AEROCHAMBER PLUS WITH MASK) inhaler Use as directed. 1 each 0   No current facility-administered medications for this visit.   Allergies: Allergies  Allergen Reactions  . Prednisone     Tingling in hands and face  . Meloxicam     heartburn   Social History: Social History   Socioeconomic History  . Marital status: Married    Spouse name: Not on file  . Number of children: 0  . Years of education: Not on file  . Highest education level: Not on file  Occupational History  . Not on file  Tobacco Use  . Smoking status: Never  . Smokeless tobacco: Never  Vaping Use  . Vaping Use: Never used  Substance and Sexual Activity  . Alcohol use: Yes    Comment: occasionally  . Drug use: No  . Sexual activity: Yes    Partners: Male    Birth control/protection: I.U.D.    Comment: skyla  Other Topics Concern  . Not on file  Social History Narrative  . Not on file   Social Determinants of Health   Financial Resource Strain: Not on file  Food Insecurity: Not on file  Transportation Needs: Not on file  Physical Activity: Not on file  Stress: Not on file  Social Connections: Not on file   Lives in a house built in 2005. 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 Breast cancer Maternal Grandmother 80  . Osteoporosis Maternal Grandmother   . Heart failure Maternal Grandmother   . COPD Maternal Grandmother   . Healthy Mother   . Hearing loss Father   . Other Father        prostate level elevation  . Diabetes Mellitus II Brother        TWIN  . Prostate cancer Maternal Grandfather        w mets  . Heart disease Maternal Grandfather   . Heart failure Maternal Grandfather   . Brain cancer Paternal  Grandmother   . Melanoma Paternal Grandfather   . Colon cancer Paternal Aunt    Problem                               Relation Asthma                                   No  Eczema                                No  Food allergy                          No  Allergic rhino conjunctivitis     No  Review of Systems  Constitutional:  Negative for appetite change, chills, fever and unexpected weight change.  HENT:  Positive for postnasal drip, rhinorrhea and sneezing. Negative for congestion.   Eyes:  Positive for itching.  Respiratory:  Negative for cough, chest tightness, shortness of breath and wheezing.   Cardiovascular:  Negative for chest pain.  Gastrointestinal:  Negative for abdominal pain.  Genitourinary:  Negative for difficulty urinating.  Skin:  Positive for rash.  Allergic/Immunologic: Positive for environmental allergies.  Neurological:  Positive for headaches.   Objective: BP 110/70   Pulse 84   Temp 98.4 F (36.9 C) (Temporal)   Resp 16   Ht 5' 3.25" (1.607 m)   Wt 138 lb 6.4 oz (62.8 kg)   SpO2 98%   BMI 24.32 kg/m  Body mass index is 24.32 kg/m. Physical Exam Vitals and nursing note reviewed.  Constitutional:      Appearance: Normal appearance. She is well-developed.  HENT:     Head: Normocephalic and atraumatic.     Right Ear: Tympanic membrane and external ear normal.     Left Ear: Tympanic membrane and external ear normal.     Nose: Nose normal.     Mouth/Throat:     Mouth: Mucous membranes are moist.     Pharynx: Oropharynx is clear.  Eyes:     Conjunctiva/sclera: Conjunctivae normal.  Cardiovascular:     Rate and Rhythm: Normal rate and regular rhythm.     Heart sounds: Normal heart sounds. No murmur heard.   No friction rub. No gallop.  Pulmonary:     Effort: Pulmonary effort is normal.     Breath sounds: Normal breath sounds. No wheezing, rhonchi or rales.  Musculoskeletal:     Cervical back: Neck supple.  Skin:    General: Skin is warm.      Findings: Rash present.     Comments: Erythematous patch on left upper extremity, faint erythematous patch on wrist area.   Neurological:     Mental Status: She is alert and oriented to person, place, and time.  Psychiatric:        Behavior: Behavior normal.   The plan was reviewed with the patient/family, and all questions/concerned were addressed.  It was my pleasure to see Tamatha today and participate in her care. Please feel free to contact me with any questions or concerns.  Sincerely,  Rexene Alberts, DO Allergy & Immunology  [facility] office: [place] office: [phone]

## 2021-05-11 ENCOUNTER — Ambulatory Visit: Payer: 59 | Admitting: Allergy

## 2021-05-11 ENCOUNTER — Other Ambulatory Visit: Payer: Self-pay

## 2021-05-11 ENCOUNTER — Encounter: Payer: Self-pay | Admitting: Allergy

## 2021-05-11 ENCOUNTER — Other Ambulatory Visit (HOSPITAL_BASED_OUTPATIENT_CLINIC_OR_DEPARTMENT_OTHER): Payer: Self-pay

## 2021-05-11 VITALS — BP 110/70 | HR 84 | Temp 98.4°F | Resp 16 | Ht 63.25 in | Wt 138.4 lb

## 2021-05-11 DIAGNOSIS — T781XXD Other adverse food reactions, not elsewhere classified, subsequent encounter: Secondary | ICD-10-CM | POA: Insufficient documentation

## 2021-05-11 DIAGNOSIS — L509 Urticaria, unspecified: Secondary | ICD-10-CM

## 2021-05-11 DIAGNOSIS — J3089 Other allergic rhinitis: Secondary | ICD-10-CM | POA: Diagnosis not present

## 2021-05-11 DIAGNOSIS — J302 Other seasonal allergic rhinitis: Secondary | ICD-10-CM | POA: Insufficient documentation

## 2021-05-11 DIAGNOSIS — J4520 Mild intermittent asthma, uncomplicated: Secondary | ICD-10-CM | POA: Diagnosis not present

## 2021-05-11 DIAGNOSIS — L508 Other urticaria: Secondary | ICD-10-CM | POA: Insufficient documentation

## 2021-05-11 MED ORDER — ALBUTEROL SULFATE HFA 108 (90 BASE) MCG/ACT IN AERS
2.0000 | INHALATION_SPRAY | RESPIRATORY_TRACT | 2 refills | Status: DC | PRN
  Filled 2021-05-11: qty 8.5, 17d supply, fill #0

## 2021-05-11 MED ORDER — FAMOTIDINE 20 MG PO TABS
20.0000 mg | ORAL_TABLET | Freq: Two times a day (BID) | ORAL | 5 refills | Status: DC
  Filled 2021-05-11: qty 60, 30d supply, fill #0

## 2021-05-11 NOTE — Assessment & Plan Note (Signed)
Diagnosed with asthma over 30 years ago and main triggers are allergies.  No inhaler use since January 2022.   Today's spirometry showed: mild restrictive disease with no improvement in FEV1 post bronchodilator treatment. Clinically feeling unchanged.  . Daily controller medication(s): none . May use albuterol rescue inhaler 2 puffs every 4 to 6 hours as needed for shortness of breath, chest tightness, coughing, and wheezing. May use albuterol rescue inhaler 2 puffs 5 to 15 minutes prior to strenuous physical activities. Monitor frequency of use.

## 2021-05-11 NOTE — Patient Instructions (Addendum)
Today's skin testing showed: Positive to grass, trees, mold. Borderline to soy and fish mix. Results given.  Rash:  Start allegra 145m twice a day. If rash is not controlled or causes drowsiness let uKoreaknow. Start pepcid (famotidine) 261mtwice a day.  Avoid the following potential triggers: alcohol, tight clothing, NSAIDs, hot showers and getting overheated. Get bloodwork:  We are ordering labs, so please allow 1-2 weeks for the results to come back. With the newly implemented Cures Act, the labs might be visible to you at the same time that they become visible to me. However, I will not address the results until all of the results are back, so please be patient.   Environmental allergies Start environmental control measures as below. Use over the counter antihistamines such as Zyrtec (cetirizine), Claritin (loratadine), Allegra (fexofenadine), or Xyzal (levocetirizine) daily as needed. May take twice a day during allergy flares. May switch antihistamines every few months. Nasal saline spray (i.e., Simply Saline) is recommended as needed. May use over the counter eye drops as needed.   Food: Continue to avoid kiwi. No skin testing available for kiwi so will get bloodwork instead. Okay to eat fish and soy for now. Will get bloodwork for this as well.  For mild symptoms you can take over the counter antihistamines such as Benadryl and monitor symptoms closely. If symptoms worsen or if you have severe symptoms including breathing issues, throat closure, significant swelling, whole body hives, severe diarrhea and vomiting, lightheadedness then seek immediate medical care.  Asthma: Daily controller medication(s): none May use albuterol rescue inhaler 2 puffs every 4 to 6 hours as needed for shortness of breath, chest tightness, coughing, and wheezing. May use albuterol rescue inhaler 2 puffs 5 to 15 minutes prior to strenuous physical activities. Monitor frequency of use.  Asthma control  goals:  Full participation in all desired activities (may need albuterol before activity) Albuterol use two times or less a week on average (not counting use with activity) Cough interfering with sleep two times or less a month Oral steroids no more than once a year No hospitalizations  Follow up in 2 months or sooner if needed.   Reducing Pollen Exposure Pollen seasons: trees (spring), grass (summer) and ragweed/weeds (fall). Keep windows closed in your home and car to lower pollen exposure.  Install air conditioning in the bedroom and throughout the house if possible.  Avoid going out in dry windy days - especially early morning. Pollen counts are highest between 5 - 10 AM and on dry, hot and windy days.  Save outside activities for late afternoon or after a heavy rain, when pollen levels are lower.  Avoid mowing of grass if you have grass pollen allergy. Be aware that pollen can also be transported indoors on people and pets.  Dry your clothes in an automatic dryer rather than hanging them outside where they might collect pollen.  Rinse hair and eyes before bedtime.  Mold Control Mold and fungi can grow on a variety of surfaces provided certain temperature and moisture conditions exist.  Outdoor molds grow on plants, decaying vegetation and soil. The major outdoor mold, Alternaria and Cladosporium, are found in very high numbers during hot and dry conditions. Generally, a late summer - fall peak is seen for common outdoor fungal spores. Rain will temporarily lower outdoor mold spore count, but counts rise rapidly when the rainy period ends. The most important indoor molds are Aspergillus and Penicillium. Dark, humid and poorly ventilated basements are ideal  sites for mold growth. The next most common sites of mold growth are the bathroom and the kitchen. Outdoor (Seasonal) Mold Control Use air conditioning and keep windows closed. Avoid exposure to decaying vegetation. Avoid leaf  raking. Avoid grain handling. Consider wearing a face mask if working in moldy areas.  Indoor (Perennial) Mold Control  Maintain humidity below 50%. Get rid of mold growth on hard surfaces with water, detergent and, if necessary, 5% bleach (do not mix with other cleaners). Then dry the area completely. If mold covers an area more than 10 square feet, consider hiring an indoor environmental professional. For clothing, washing with soap and water is best. If moldy items cannot be cleaned and dried, throw them away. Remove sources e.g. contaminated carpets. Repair and seal leaking roofs or pipes. Using dehumidifiers in damp basements may be helpful, but empty the water and clean units regularly to prevent mildew from forming. All rooms, especially basements, bathrooms and kitchens, require ventilation and cleaning to deter mold and mildew growth. Avoid carpeting on concrete or damp floors, and storing items in damp areas.

## 2021-05-11 NOTE — Assessment & Plan Note (Signed)
Perennial rhinoconjunctivitis symptoms for the past 30+ years with worse in the spring and fall.  Patient was on AIT for 5 years by ENT with some benefit.  Today's skin prick testing showed: Positive to grass, trees, mold.  No intradermal testing was done as patient had sensitive skin. Will get bloodwork instead.   Start environmental control measures as below.  Use over the counter antihistamines such as Zyrtec (cetirizine), Claritin (loratadine), Allegra (fexofenadine), or Xyzal (levocetirizine) daily as needed. May take twice a day during allergy flares. May switch antihistamines every few months.  Nasal saline spray (i.e., Simply Saline) is recommended as needed.  May use over the counter eye drops as needed.

## 2021-05-11 NOTE — Assessment & Plan Note (Signed)
Breaking out in pruritic rash for the past 8 years.  Episodes lasts a few hours at a time and describes them as itchy and red.  Antihistamines do seem to help.  Zyrtec caused worsening PND.  In the past ANA, TSH, CBC differential and CMP were unremarkable.  Try to avoid gluten with no benefit. 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 Discussed with patient, that urticaria is usually caused by release of histamine by cutaneous mast cells but sometimes it is non-histamine mediated. Explained that urticaria is not always associated with allergies. In most cases, the exact etiology for urticaria can not be established and it is considered idiopathic.  Start allegra 132m twice a day.  If rash is not controlled or causes drowsiness let [place].  Start Pepcid (famotidine) 237mtwice a day.  . Avoid the following potential triggers: alcohol, tight clothing, NSAIDs, hot showers and getting overheated. . Get bloodwork to rule out other etiologies.

## 2021-05-11 NOTE — Assessment & Plan Note (Signed)
Noted some perioral pruritus after eating kiwi in May 2022.  Symptoms resolved after taking Benadryl.  No issues with bananas, avocados or latex.  Patient eats soy and fish with no issues.  Today's skin testing showed: Borderline to soy and fish mix. . Continue to avoid kiwi. . No skin testing available for kiwi so will get bloodwork instead. Faythe Ghee to eat fish and soy for now. Will get bloodwork for this as well.  . For mild symptoms you can take over the counter antihistamines such as Benadryl and monitor symptoms closely. If symptoms worsen or if you have severe symptoms including breathing issues, throat closure, significant swelling, whole body hives, severe diarrhea and vomiting, lightheadedness then seek immediate medical care.

## 2021-05-20 LAB — ALLERGENS W/TOTAL IGE AREA 2
Alternaria Alternata IgE: <0.1 kU/L
Aspergillus Fumigatus IgE: <0.1 kU/L
Bermuda Grass IgE: <0.1 kU/L
Cat Dander IgE: 1.39 kU/L — AB
Cedar, Mountain IgE: <0.1 kU/L
Cladosporium Herbarum IgE: <0.1 kU/L
Cockroach, German IgE: <0.1 kU/L
Common Silver Birch IgE: 5.86 kU/L — AB
Cottonwood IgE: <0.1 kU/L
D Farinae IgE: <0.1 kU/L
D Pteronyssinus IgE: <0.1 kU/L
Dog Dander IgE: 1.57 kU/L — AB
Elm, American IgE: 0.12 kU/L — AB
Johnson Grass IgE: <0.1 kU/L
Maple/Box Elder IgE: 0.12 kU/L — AB
Mouse Urine IgE: <0.1 kU/L
Oak, White IgE: 2.2 kU/L — AB
Pecan, Hickory IgE: 3.31 kU/L — AB
Penicillium Chrysogen IgE: <0.1 kU/L
Pigweed, Rough IgE: <0.1 kU/L
Ragweed, Short IgE: <0.1 kU/L
Sheep Sorrel IgE Qn: <0.1 kU/L
Timothy Grass IgE: <0.1 kU/L
White Mulberry IgE: <0.1 kU/L

## 2021-05-20 LAB — ALPHA-GAL PANEL
Allergen Lamb IgE: <0.1 kU/L
Beef IgE: <0.1 kU/L
IgE (Immunoglobulin E), Serum: 147 IU/mL (ref 6–495)
O215-IgE Alpha-Gal: <0.1 kU/L
Pork IgE: <0.1 kU/L

## 2021-05-20 LAB — ALLERGEN PROFILE, FOOD-FISH
Allergen Mackerel IgE: <0.1 kU/L
Allergen Salmon IgE: <0.1 kU/L
Allergen Trout IgE: <0.1 kU/L
Allergen Walley Pike IgE: <0.1 kU/L
Codfish IgE: <0.1 kU/L
Halibut IgE: <0.1 kU/L
Tuna: <0.1 kU/L

## 2021-05-20 LAB — SEDIMENTATION RATE: Sed Rate: 2 mm/hr (ref 0–32)

## 2021-05-20 LAB — C-REACTIVE PROTEIN: CRP: 5 mg/L (ref 0–10)

## 2021-05-20 LAB — TRYPTASE: Tryptase: 2 ug/L — ABNORMAL LOW (ref 2.2–13.2)

## 2021-05-20 LAB — ALLERGEN SOYBEAN: Soybean IgE: <0.1 kU/L

## 2021-05-20 LAB — ALLERGEN, KIWI FRUIT, F84: Kiwi Fruit: 0.8 kU/L — AB

## 2021-05-20 LAB — THYROID CASCADE PROFILE: TSH: 0.8 u[IU]/mL (ref 0.450–4.500)

## 2021-05-20 LAB — CHRONIC URTICARIA: cu index: 18.3 — ABNORMAL HIGH (ref <10)

## 2021-05-23 ENCOUNTER — Encounter: Payer: Self-pay | Admitting: Allergy

## 2021-05-26 NOTE — Progress Notes (Signed)
[place] Phone: [phone] Subjective:   Fontaine No, am serving as a scribe for Dr. Hulan Saas. This visit occurred during the SARS-CoV-2 public health emergency.  Safety protocols were in place, including screening questions prior to the visit, additional usage of staff PPE, and extensive cleaning of exam room while observing appropriate contact time as indicated for disinfecting solutions.   I'm seeing this patient by the request  of:  Koberlein, Steele Berg, MD  CC: Back and neck pain  [id number]  Dinna Severs is a 35 y.o. female coming in with complaint of back and neck pain. OMT 04/06/2021. Patient states overall doing relatively well.  Some very mild tach.  Patient states that over otherwise doing well.  Has been able to increase running more regularly.  No radiation of the pain.  Medications patient has been prescribed: None          Reviewed prior external information including notes and imaging from previsou exam, outside providers and external EMR if available.   As well as notes that were available from care everywhere and other healthcare systems.  Past medical history, social, surgical and family history all reviewed in electronic medical record.  No pertanent information unless stated regarding to the chief complaint.   Past Medical History:  Diagnosis Date   Allergy    Asthma    Hx of migraines 01/012003   can tell when they come on, treats with excedrin migraine.   Scoliosis 10/24/1995   Vitamin D deficiency     Allergies  Allergen Reactions   Prednisone     Tingling in hands and face   Meloxicam     heartburn     Review of Systems:  No headache, visual changes, nausea, vomiting, diarrhea, constipation, dizziness, abdominal pain, skin rash, fevers, chills, night sweats, weight loss, swollen lymph nodes, body aches, joint swelling, chest pain, shortness of breath,  mood changes. POSITIVE muscle aches  Objective  Blood pressure 110/74, pulse 63, height 5' 3"  (1.6 m), weight 138 lb (62.6 kg), SpO2 98 %.   General: No apparent distress alert and oriented x3 mood and affect normal, dressed appropriately.  HEENT: Pupils equal, extraocular movements intact  Respiratory: Patient's speak in full sentences and does not appear short of breath  Cardiovascular: No lower extremity edema, non tender, no erythema    Osteopathic findings  T4-7 neutral rotated left side bent right inhaled fourth rib noted L4 flexed rotated and side bent right Sacrum left on left Right anterior ilium    Assessment and Plan:  Scoliosis Chronic but stable at this moment.  Responding well to manipulation.  Discussed icing regimen and home exercises.  Follow-up again in 2 months   Nonallopathic problems  Decision today to treat with OMT was based on Physical Exam  After verbal consent patient was treated with HVLA, ME, FPR techniques in  rib, thoracic, lumbar, and sacral  areas  Patient tolerated the procedure well with improvement in symptoms  Patient given exercises, stretches and lifestyle modifications  See medications in patient instructions if given  Patient will follow up in 4-8 weeks      The above documentation has been reviewed and is accurate and complete Lyndal Pulley, DO        Note: This dictation was prepared with Dragon dictation along with smaller phrase technology. Any transcriptional errors that result from this process are unintentional.

## 2021-05-27 ENCOUNTER — Other Ambulatory Visit (HOSPITAL_COMMUNITY): Payer: Self-pay

## 2021-05-27 ENCOUNTER — Encounter: Payer: Self-pay | Admitting: Family Medicine

## 2021-05-27 ENCOUNTER — Other Ambulatory Visit: Payer: Self-pay

## 2021-05-27 ENCOUNTER — Ambulatory Visit: Payer: 59 | Admitting: Family Medicine

## 2021-05-27 VITALS — BP 110/74 | HR 63 | Ht 63.0 in | Wt 138.0 lb

## 2021-05-27 DIAGNOSIS — M41125 Adolescent idiopathic scoliosis, thoracolumbar region: Secondary | ICD-10-CM

## 2021-05-27 DIAGNOSIS — M9904 Segmental and somatic dysfunction of sacral region: Secondary | ICD-10-CM

## 2021-05-27 DIAGNOSIS — M9903 Segmental and somatic dysfunction of lumbar region: Secondary | ICD-10-CM

## 2021-05-27 DIAGNOSIS — M9902 Segmental and somatic dysfunction of thoracic region: Secondary | ICD-10-CM | POA: Diagnosis not present

## 2021-05-27 NOTE — Assessment & Plan Note (Signed)
Chronic but stable at this moment.  Responding well to manipulation.  Discussed icing regimen and home exercises.  Follow-up again in 2 months

## 2021-05-27 NOTE — Patient Instructions (Signed)
Great to see you Continue to do exercises More than ready for 10k See me again in 2 months

## 2021-06-01 ENCOUNTER — Telehealth: Payer: 59 | Admitting: Physician Assistant

## 2021-06-01 ENCOUNTER — Other Ambulatory Visit (HOSPITAL_BASED_OUTPATIENT_CLINIC_OR_DEPARTMENT_OTHER): Payer: Self-pay

## 2021-06-01 DIAGNOSIS — J019 Acute sinusitis, unspecified: Secondary | ICD-10-CM

## 2021-06-01 DIAGNOSIS — B9789 Other viral agents as the cause of diseases classified elsewhere: Secondary | ICD-10-CM | POA: Diagnosis not present

## 2021-06-01 MED ORDER — FLUTICASONE PROPIONATE 50 MCG/ACT NA SUSP
2.0000 | Freq: Every day | NASAL | 0 refills | Status: DC
  Filled 2021-06-01: qty 16, 30d supply, fill #0

## 2021-06-01 MED ORDER — BENZONATATE 100 MG PO CAPS
100.0000 mg | ORAL_CAPSULE | Freq: Three times a day (TID) | ORAL | 0 refills | Status: DC | PRN
Start: 2021-06-01 — End: 2021-08-23
  Filled 2021-06-01: qty 30, 10d supply, fill #0

## 2021-06-01 NOTE — Progress Notes (Signed)
E-Visit for Sinus Problems  We are sorry that you are not feeling well.  Here is how we plan to help!  Based on what you have shared with me it looks like you have sinusitis.  Sinusitis is inflammation and infection in the sinus cavities of the head.  Based on your presentation I believe you most likely have Acute Viral Sinusitis.This is an infection most likely caused by a virus. There is not specific treatment for viral sinusitis other than to help you with the symptoms until the infection runs its course.  You may use an oral decongestant such as Mucinex D or if you have glaucoma or high blood pressure use plain Mucinex. Saline nasal spray help and can safely be used as often as needed for congestion, I have prescribed: Fluticasone nasal spray two sprays in each nostril once a day  Some authorities believe that zinc tablets or the use of Echinacea may shorten the course of your symptoms.  Sinus infections are not as easily transmitted as other respiratory infection, however we still recommend that you avoid close contact with loved ones, especially the very young and elderly.  Remember to wash your hands thoroughly throughout the day as this is the number one way to prevent the spread of infection!  However,  Your current symptoms could be consistent with the coronavirus.  Many health care providers can now test patients at their office but not all are.  [place] has multiple testing sites. For information on our [place] testing locations and hours go to [email or web address]  We are enrolling you in our [place] for [place] . Daily you will receive a questionnaire within the [place] website. Our COVID 19 response team will be monitoring your responses daily.  Testing Information: The COVID-19 Community Testing sites are testing BY APPOINTMENT ONLY.  You can schedule online at [email or web address]  If you do not have access to a smart phone or computer you may call [phone]  for an appointment.   Additional testing sites in the Community:  For [facility] Testing sites in [place]  [email or web address]  For Pop-up testing sites in [place]  [email or web address]  For [facility] [email or web address]  For [facility] testing in [place] and [employer] [email or web address]  For [facility] testing in [place]   [email or web address]  For  more information about community testing call [phone]   Please quarantine yourself while awaiting your test results. Please stay home for a minimum of 10 days from the first day of illness with improving symptoms and you have had 24 hours of no fever (without the use of Tylenol (Acetaminophen) Motrin (Ibuprofen) or any fever reducing medication).  Also - Do not get tested prior to returning to work because once you have had a positive test the test can stay positive for more than a month in some cases.   You should wear a mask or cloth face covering over your nose and mouth if you must be around other people or animals, including pets (even at home). Try to stay at least 6 feet away from other people. This will protect the people around you.  Please continue good preventive care measures, including:  frequent hand-washing, avoid touching your face, cover coughs/sneezes, stay out of crowds and keep a 6 foot distance from others.  COVID-19 is a respiratory illness with symptoms that are similar to the flu. Symptoms are typically mild to moderate, but there have been cases of severe illness and death due to  the virus.   The following symptoms may appear 2-14 days after  exposure: Fever Cough Shortness of breath or difficulty breathing Chills Repeated shaking with chills Muscle pain Headache Sore throat New loss of taste or smell Fatigue Congestion or runny nose Nausea or vomiting Diarrhea  Go to the nearest hospital ED for assessment if fever/cough/breathlessness are severe or illness seems like a threat to life.  It is vitally important that if you feel that you have an infection such as this virus or any other virus that you stay home and away from places where you may spread it to others.  You should avoid contact with people age 66 and older.   You can use medication such as prescription cough medication called Tessalon Perles 100 mg. You may take 1-2 capsules every 8 hours as needed for cough  You may also take acetaminophen (Tylenol) as needed for fever.  Reduce your risk of any infection by using the same precautions used for avoiding the common cold or flu:  Wash your hands often with soap and warm water for at least 20 seconds.  If soap and water are not readily available, use an alcohol-based hand sanitizer with at least 60% alcohol.  If coughing or sneezing, cover your mouth and nose by coughing or sneezing into the elbow areas of your shirt or coat, into a tissue or into your sleeve (not your hands). Avoid shaking hands with others and consider head nods or verbal greetings only. Avoid touching your eyes, nose, or mouth with unwashed hands.  Avoid close contact with people who are sick. Avoid places or events with large numbers of people in one location, like concerts or sporting events. Carefully consider travel plans you have or are making. If you are planning any travel outside or inside the [place], visit the CDC's Travelers' Health webpage for the latest health notices. If you have some symptoms but not all symptoms, continue to monitor at home and seek medical attention if your symptoms worsen. If you are having a medical emergency, call  911.  HOME CARE Only take medications as instructed by your medical team. Drink plenty of fluids and get plenty of rest. A steam or ultrasonic humidifier can help if you have congestion.   GET HELP RIGHT AWAY IF YOU HAVE EMERGENCY WARNING SIGNS** FOR COVID-19. If you or someone is showing any of these signs seek emergency medical care immediately. Call 911 or proceed to your closest emergency facility if: You develop worsening high fever. Trouble breathing Bluish lips or face Persistent pain or pressure in the chest New confusion Inability to wake or stay awake You cough up blood. Your symptoms become more severe  **This list is not all possible symptoms. Contact your medical provider for any symptoms that are sever or concerning to you.  MAKE SURE YOU  Understand these instructions. Will watch your condition. Will get help right away if you are not doing well or get worse.  Your e-visit answers were reviewed by a board certified advanced clinical practitioner to complete your personal care plan.  Depending on the condition, your plan could have included both over the counter or prescription medications.  If there is a problem please reply once you have received a response from your provider.  Your safety is important to [place].  If you have drug allergies check your prescription carefully.    You can use MyChart to ask questions about today's visit, request a non-urgent call back, or ask for a  work or school excuse for 24 hours related to this e-Visit. If it has been greater than 24 hours you will need to follow up with your provider, or enter a new e-Visit to address those concerns. You will get an e-mail in the next two days asking about your experience.  I hope that your e-visit has been valuable and will speed your recovery. Thank you for using e-visits.  I provided 7 minutes of non face-to-face time during this encounter for chart review and documentation.

## 2021-06-14 ENCOUNTER — Other Ambulatory Visit (HOSPITAL_BASED_OUTPATIENT_CLINIC_OR_DEPARTMENT_OTHER): Payer: Self-pay

## 2021-07-07 ENCOUNTER — Other Ambulatory Visit (HOSPITAL_BASED_OUTPATIENT_CLINIC_OR_DEPARTMENT_OTHER): Payer: Self-pay

## 2021-07-07 MED ORDER — COVID-19 AT HOME ANTIGEN TEST VI KIT
PACK | 0 refills | Status: DC
  Filled 2021-07-07: qty 2, 4d supply, fill #0

## 2021-07-18 ENCOUNTER — Ambulatory Visit: Payer: 59 | Admitting: Allergy

## 2021-07-19 ENCOUNTER — Other Ambulatory Visit (HOSPITAL_BASED_OUTPATIENT_CLINIC_OR_DEPARTMENT_OTHER): Payer: Self-pay

## 2021-07-19 MED ORDER — NORETHIN-ETH ESTRAD-FE BIPHAS 1 MG-10 MCG / 10 MCG PO TABS
ORAL_TABLET | ORAL | 2 refills | Status: DC
  Filled 2021-07-19 – 2021-08-17 (×2): qty 84, 84d supply, fill #0

## 2021-07-20 ENCOUNTER — Other Ambulatory Visit (HOSPITAL_BASED_OUTPATIENT_CLINIC_OR_DEPARTMENT_OTHER): Payer: Self-pay

## 2021-07-27 NOTE — Progress Notes (Signed)
[place] Phone: [phone] Subjective:   Fontaine No, am serving as a scribe for Dr. Hulan Saas. This visit occurred during the SARS-CoV-2 public health emergency.  Safety protocols were in place, including screening questions prior to the visit, additional usage of staff PPE, and extensive cleaning of exam room while observing appropriate contact time as indicated for disinfecting solutions.    I'm seeing this patient by the request  of:  Koberlein, Steele Berg, MD  CC: Back and neck pain follow-up  [id number]  Oaklynn Stierwalt is a 35 y.o. female coming in with complaint of back and neck pain. OMT 05/27/2021.  Patient has been working out and running on a fairly regular basis.  Patient states that she is having R sided lower back pain. Patient will have radicular symptoms in R leg with running. Patient able to pop lower back which will release pain. Patient has increased mileage.   Also expericing burning sensation between scapula wakes patient up at night.   Medications: None          Reviewed prior external information including notes and imaging from previsou exam, outside providers and external EMR if available.   As well as notes that were available from care everywhere and other healthcare systems.  Past medical history, social, surgical and family history all reviewed in electronic medical record.  No pertanent information unless stated regarding to the chief complaint.   Past Medical History:  Diagnosis Date   Allergy    Asthma    Hx of migraines 01/012003   can tell when they come on, treats with excedrin migraine.   Scoliosis 10/24/1995   Vitamin D deficiency     Allergies  Allergen Reactions   Prednisone     Tingling in hands and face   Meloxicam     heartburn     Review of Systems:  No headache, visual changes, nausea, vomiting, diarrhea, constipation, dizziness,  abdominal pain, skin rash, fevers, chills, night sweats, weight loss, swollen lymph nodes, body aches, joint swelling, chest pain, shortness of breath, mood changes. POSITIVE muscle aches  Objective  Blood pressure 108/80, pulse 86, height 5' 3"  (1.6 m), SpO2 98 %.   General: No apparent distress alert and oriented x3 mood and affect normal, dressed appropriately.  HEENT: Pupils equal, extraocular movements intact  Respiratory: Patient's speak in full sentences and does not appear short of breath  Cardiovascular: No lower extremity edema, non tender, no erythema  Neuro: Cranial nerves II through XII are intact, neurovascularly intact in all extremities with 2+ DTRs and 2+ pulses.  Gait normal with good balance and coordination.  MSK:  Non tender with full range of motion and good stability and symmetric strength and tone of shoulders, elbows, wrist, hip, knee and ankles bilaterally.  Back -scoliosis noted.  Mild increase in tightness Left parascapular region.  Patient also has tightness in the left sacroiliac area.  Osteopathic findings  T4-6 neutral rotated left side bent right inhaled fourth and sixth rib L4 flexed rotated and side bent right Sacrum left on left Right anterior ilium       Assessment and Plan:  Scoliosis Stable overall.  Continues to respond relatively well though to the muscle energy techniques more than the etiology.  Patient does have soreness from time to time but nothing severe.  Patient was having some intermittent radicular symptoms but does have some muscle relaxers if needed.  Discussed icing  regimen and home exercises.  Patient has been able to run and encouraged her to continue to do so.  Follow-up with me again in 6 weeks   Nonallopathic problems  Decision today to treat with OMT was based on Physical Exam  After verbal consent patient was treated with HVLA, ME, FPR techniques in pelvis, rib, thoracic, lumbar, and sacral  areas  Patient tolerated the  procedure well with improvement in symptoms  Patient given exercises, stretches and lifestyle modifications  See medications in patient instructions if given  Patient will follow up in 4-8 weeks     The above documentation has been reviewed and is accurate and complete Lyndal Pulley, DO        Note: This dictation was prepared with Dragon dictation along with smaller phrase technology. Any transcriptional errors that result from this process are unintentional.

## 2021-07-28 ENCOUNTER — Other Ambulatory Visit (HOSPITAL_BASED_OUTPATIENT_CLINIC_OR_DEPARTMENT_OTHER): Payer: Self-pay

## 2021-07-28 ENCOUNTER — Other Ambulatory Visit: Payer: Self-pay

## 2021-07-28 ENCOUNTER — Ambulatory Visit: Payer: 59 | Admitting: Family Medicine

## 2021-07-28 VITALS — BP 108/80 | HR 86 | Ht 63.0 in

## 2021-07-28 DIAGNOSIS — M41125 Adolescent idiopathic scoliosis, thoracolumbar region: Secondary | ICD-10-CM

## 2021-07-28 DIAGNOSIS — M9903 Segmental and somatic dysfunction of lumbar region: Secondary | ICD-10-CM

## 2021-07-28 DIAGNOSIS — M9908 Segmental and somatic dysfunction of rib cage: Secondary | ICD-10-CM

## 2021-07-28 DIAGNOSIS — M9904 Segmental and somatic dysfunction of sacral region: Secondary | ICD-10-CM

## 2021-07-28 DIAGNOSIS — M9905 Segmental and somatic dysfunction of pelvic region: Secondary | ICD-10-CM | POA: Diagnosis not present

## 2021-07-28 DIAGNOSIS — M9902 Segmental and somatic dysfunction of thoracic region: Secondary | ICD-10-CM | POA: Diagnosis not present

## 2021-07-28 MED ORDER — COVID-19 AT HOME ANTIGEN TEST VI KIT
PACK | 0 refills | Status: DC
  Filled 2021-07-28: qty 2, 4d supply, fill #0

## 2021-07-28 NOTE — Patient Instructions (Signed)
See me after you kick butt in 1/2 marathon say 6 weeks

## 2021-07-28 NOTE — Assessment & Plan Note (Signed)
Stable overall.  Continues to respond relatively well though to the muscle energy techniques more than the etiology.  Patient does have soreness from time to time but nothing severe.  Patient was having some intermittent radicular symptoms but does have some muscle relaxers if needed.  Discussed icing regimen and home exercises.  Patient has been able to run and encouraged her to continue to do so.  Follow-up with me again in 6 weeks

## 2021-08-12 ENCOUNTER — Ambulatory Visit: Payer: 59 | Admitting: Family Medicine

## 2021-08-12 ENCOUNTER — Encounter: Payer: Self-pay | Admitting: Family Medicine

## 2021-08-12 ENCOUNTER — Other Ambulatory Visit (HOSPITAL_BASED_OUTPATIENT_CLINIC_OR_DEPARTMENT_OTHER): Payer: Self-pay

## 2021-08-12 ENCOUNTER — Other Ambulatory Visit: Payer: Self-pay

## 2021-08-12 VITALS — BP 118/74 | HR 71 | Resp 16

## 2021-08-12 DIAGNOSIS — L508 Other urticaria: Secondary | ICD-10-CM

## 2021-08-12 DIAGNOSIS — J4520 Mild intermittent asthma, uncomplicated: Secondary | ICD-10-CM | POA: Diagnosis not present

## 2021-08-12 DIAGNOSIS — J302 Other seasonal allergic rhinitis: Secondary | ICD-10-CM | POA: Diagnosis not present

## 2021-08-12 DIAGNOSIS — T7800XD Anaphylactic reaction due to unspecified food, subsequent encounter: Secondary | ICD-10-CM | POA: Diagnosis not present

## 2021-08-12 DIAGNOSIS — T7800XA Anaphylactic reaction due to unspecified food, initial encounter: Secondary | ICD-10-CM

## 2021-08-12 DIAGNOSIS — J3089 Other allergic rhinitis: Secondary | ICD-10-CM | POA: Diagnosis not present

## 2021-08-12 MED ORDER — EPINEPHRINE 0.3 MG/0.3ML IJ SOAJ
0.3000 mg | Freq: Once | INTRAMUSCULAR | 2 refills | Status: AC
  Filled 2021-08-12: qty 2, 2d supply, fill #0

## 2021-08-12 NOTE — Progress Notes (Signed)
[address] [place] [address] Dept: [phone]  FOLLOW UP NOTE  Patient ID: Madison [name], female    DOB: 1985/11/25  Age: 35 y.o. MRN: [id number] Date of Office Visit: 08/12/2021  Assessment  Chief Complaint: Urticaria, Asthma, and Allergic Rhinitis   HPI Madison [name] is a 35 year old female who presents the clinic for a follow-up visit.  She was last seen in this clinic on 05/11/2021 by Dr. 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  At today's visit, she reports her asthma has been moderately well controlled with occasional shortness of breath after climbing stairs.  She denies shortness of breath, cough, and wheeze with rest and moderate activity.  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  She has not used her albuterol since her last visit to this clinic.  Allergic rhinitis is reported as moderately well controlled with nasal congestion with thick yellow to green drainage occurring in the morning which becomes clear and resolves in the late morning.  She continues saline nasal rinses as needed.  Her last environmental allergy skin testing was on 05/11/2021 and was positive to grass pollen, tree pollen, and mold.  Lab work from the same day indicates additional allergies to cat, dog, and tree pollen. Allergic conjunctivitis is reported as moderately well controlled with red and itchy eyes occurring occasionally in the evening especially after being outside.  She continues to wash her face especially her eyelashes after coming in from outside.  Chronic urticaria is reported as moderately well controlled with breakouts occurring about once a month and lasting for about a day.  She reports the hives occur and resolve before occurring in another location.  She denies concomitant cardiopulmonary or gastrointestinal symptoms with these hives.  She continues  Allegra 180 mg once a day with good control of hives and if she misses a dose she will begin to experience hives.  She has previously taken Allegra twice a day in addition to famotidine which made her very sleepy.  Her chronic urticaria panel was positive on 05/11/2021.  She continues to avoid kiwi and pineapple with no accidental ingestion or EpiPen use since her last visit to this clinic.  Her current medications are listed in the chart.    Drug Allergies:  Allergies  Allergen Reactions   Prednisone     Tingling in hands and face   Meloxicam     heartburn    Physical Exam: BP 118/74   Pulse 71   Resp 16   SpO2 98%    Physical Exam Vitals reviewed.  Constitutional:      Appearance: Normal appearance.  HENT:     Head: Normocephalic and atraumatic.     Right Ear: Tympanic membrane normal.     Left Ear: Tympanic membrane normal.     Nose:     Comments: Bilateral nares slightly erythematous with clear nasal drainage noted.  Pharynx normal.  Ears normal.  Eyes normal.    Mouth/Throat:     Pharynx: Oropharynx is clear.  Eyes:     Conjunctiva/sclera: Conjunctivae normal.  Cardiovascular:     Rate and Rhythm: Normal rate and regular rhythm.     Heart sounds: Normal heart sounds. No murmur heard. Pulmonary:     Effort: Pulmonary effort is normal.     Breath sounds: Normal breath sounds.     Comments: Lungs clear to auscultation Musculoskeletal:  General: Normal range of motion.     Cervical back: Normal range of motion and neck supple.  Skin:    General: Skin is warm and dry.  Neurological:     Mental Status: She is alert and oriented to person, place, and time.  Psychiatric:        Mood and Affect: Mood normal.        Behavior: Behavior normal.        Thought Content: Thought content normal.        Judgment: Judgment normal.    Diagnostics: FVC 2.00, FEV1 1.72.  Predicted FVC 3.61, predicted FEV1 3.00.  Spirometry indicates restriction.  This is consistent with  previous spirometry readings.  Assessment and Plan: 1. Mild intermittent asthma without complication   2. Seasonal and perennial allergic rhinitis   3. Chronic urticaria   4. Allergy with anaphylaxis due to food     Meds ordered this encounter  Medications   EPINEPHrine (EPIPEN 2-PAK) 0.3 mg/0.3 mL IJ SOAJ injection    Sig: Inject 0.3 mg into the muscle once for 1 dose.    Dispense:  2 each    Refill:  2     Patient Instructions  Asthma Continue albuterol 2 puffs once every 4 hours as needed for cough or wheeze You may use albuterol 2 puffs 5 to 15 minutes before activity to take  Allergic rhinitis Continue allergen avoidance measures directed toward grass pollen, tree pollen, mold, cat, and dog as listed below Continue Allegra 180 mg once a day as needed for runny nose or itch Continue Flonase 2 sprays in each nostril once a day as needed for stuffy nose  In the right nostril, point the applicator out toward the right ear. In the left nostril, point the applicator out toward the left ear Consider saline nasal rinses as needed for nasal symptoms. Use this before any medicated nasal sprays for best result  Allergic conjunctivitis Some over the counter eye drops include Pataday one drop in each eye once a day as needed for red, itchy eyes OR Zaditor one drop in each eye twice a day as needed for red itchy eyes.  Hives (urticaria) Take the least amount of medication while remaining hive free Fexofenadine (Allegra) 180 mg twice a day and famotidine (Pepcid) 20 mg twice a day. If no symptoms for 7-14 days then decrease to. Fexofenadine (Allegra) 180 mg twice a day and famotidine (Pepcid) 20 mg once a day.  If no symptoms for 7-14 days then decrease to. Fexofenadine (Allegra) 180 mg twice a day.  If no symptoms for 7-14 days then decrease to. Fexofenadine (Allegra) 180 mg once a day.  May use Benadryl (diphenhydramine) as needed for breakthrough hives       If symptoms return, then  step up dosage  Keep a detailed symptom journal including foods eaten, contact with allergens, medications taken, weather changes.  Written information provided about Xolair injections for chronic hives  Food allergy Continue to avoid pineapple and kiwi. In case of an allergic reaction, take Benadryl 50 mg every 4 hours, and if life-threatening symptoms occur, inject with EpiPen 0.3 mg.  Call the clinic if this treatment plan is not working well for you.  Follow up in 6 months or sooner if needed.   Return in about 6 months (around 02/10/2022), or if symptoms worsen or fail to improve.    Thank you for the opportunity to care for this patient.  Please do not hesitate to contact  me with questions.  Gareth Morgan, FNP [facility]

## 2021-08-12 NOTE — Patient Instructions (Addendum)
Asthma Continue albuterol 2 puffs once every 4 hours as needed for cough or wheeze You may use albuterol 2 puffs 5 to 15 minutes before activity to take  Allergic rhinitis Continue allergen avoidance measures directed toward grass pollen, tree pollen, mold, cat, and dog as listed below Continue Allegra 180 mg once a day as needed for runny nose or itch Continue Flonase 2 sprays in each nostril once a day as needed for stuffy nose  In the right nostril, point the applicator out toward the right ear. In the left nostril, point the applicator out toward the left ear Consider saline nasal rinses as needed for nasal symptoms. Use this before any medicated nasal sprays for best result  Allergic conjunctivitis Some over the counter eye drops include Pataday one drop in each eye once a day as needed for red, itchy eyes OR Zaditor one drop in each eye twice a day as needed for red itchy eyes.  Hives (urticaria) Take the least amount of medication while remaining hive free Fexofenadine (Allegra) 180 mg twice a day and famotidine (Pepcid) 20 mg twice a day. If no symptoms for 7-14 days then decrease to. Fexofenadine (Allegra) 180 mg twice a day and famotidine (Pepcid) 20 mg once a day.  If no symptoms for 7-14 days then decrease to. Fexofenadine (Allegra) 180 mg twice a day.  If no symptoms for 7-14 days then decrease to. Fexofenadine (Allegra) 180 mg once a day.  May use Benadryl (diphenhydramine) as needed for breakthrough hives       If symptoms return, then step up dosage  Keep a detailed symptom journal including foods eaten, contact with allergens, medications taken, weather changes.  Written information provided about Xolair injections for chronic hives  Food allergy Continue to avoid pineapple and kiwi. In case of an allergic reaction, take Benadryl 50 mg every 4 hours, and if life-threatening symptoms occur, inject with EpiPen 0.3 mg.  Call the clinic if this treatment plan is not working  well for you.  Follow up in 6 months or sooner if needed.  Reducing Pollen Exposure The American Academy of Allergy, Asthma and Immun[employer] suggests the following steps to reduce your exposure to pollen during allergy seasons. Do not hang sheets or clothing out to dry; pollen may collect on these items. Do not mow lawns or spend time around freshly cut grass; mowing stirs up pollen. Keep windows closed at night.  Keep car windows closed while driving. Minimize morning activities outdoors, a time when pollen counts are usually at their highest. Stay indoors as much as possible when pollen counts or humidity is high and on windy days when pollen tends to remain in the air longer. Use air conditioning when possible.  Many air conditioners have filters that trap the pollen spores. Use a HEPA room air filter to remove pollen form the indoor air you breathe.  Control of Mold Allergen Mold and fungi can grow on a variety of surfaces provided certain temperature and moisture conditions exist.  Outdoor molds grow on plants, decaying vegetation and soil.  The major outdoor mold, Alternaria and Cladosporium, are found in very high numbers during hot and dry conditions.  Generally, a late Summer - Fall peak is seen for common outdoor fungal spores.  Rain will temporarily lower outdoor mold spore count, but counts rise rapidly when the rainy period ends.  The most important indoor molds are Aspergillus and Penicillium.  Dark, humid and poorly ventilated basements are ideal sites for mold growth.  The next most common sites of mold growth are the bathroom and the kitchen.  Outdoor [employer] Use air conditioning and keep windows closed Avoid exposure to decaying vegetation. Avoid leaf raking. Avoid grain handling. Consider wearing a face mask if working in moldy areas.  Indoor Mold Control Maintain humidity below 50%. Clean washable surfaces with 5% bleach solution. Remove sources e.g. Contaminated  carpets.  Control of Dog or Cat Allergen Avoidance is the best way to manage a dog or cat allergy. If you have a dog or cat and are allergic to dog or cats, consider removing the dog or cat from the home. If you have a dog or cat but don't want to find it a new home, or if your family wants a pet even though someone in the household is allergic, here are some strategies that may help keep symptoms at bay:  Keep the pet out of your bedroom and restrict it to only a few rooms. Be advised that keeping the dog or cat in only one room will not limit the allergens to that room. Don't pet, hug or kiss the dog or cat; if you do, wash your hands with soap and water. High-efficiency particulate air (HEPA) cleaners run continuously in a bedroom or living room can reduce allergen levels over time. Regular use of a high-efficiency vacuum cleaner or a central vacuum can reduce allergen levels. Giving your dog or cat a bath at least once a week can reduce airborne allergen.

## 2021-08-17 ENCOUNTER — Other Ambulatory Visit (HOSPITAL_BASED_OUTPATIENT_CLINIC_OR_DEPARTMENT_OTHER): Payer: Self-pay

## 2021-08-19 ENCOUNTER — Other Ambulatory Visit (HOSPITAL_BASED_OUTPATIENT_CLINIC_OR_DEPARTMENT_OTHER): Payer: Self-pay

## 2021-08-21 DIAGNOSIS — M549 Dorsalgia, unspecified: Secondary | ICD-10-CM | POA: Insufficient documentation

## 2021-08-21 DIAGNOSIS — G43909 Migraine, unspecified, not intractable, without status migrainosus: Secondary | ICD-10-CM | POA: Insufficient documentation

## 2021-08-22 ENCOUNTER — Other Ambulatory Visit (HOSPITAL_BASED_OUTPATIENT_CLINIC_OR_DEPARTMENT_OTHER): Payer: Self-pay

## 2021-08-22 MED ORDER — NORETHINDRONE ACET-ETHINYL EST 1-20 MG-MCG PO TABS
ORAL_TABLET | ORAL | 3 refills | Status: DC
  Filled 2021-08-23: qty 84, 84d supply, fill #0
  Filled 2021-10-19 – 2021-11-24 (×2): qty 84, 84d supply, fill #1

## 2021-08-23 ENCOUNTER — Other Ambulatory Visit (HOSPITAL_BASED_OUTPATIENT_CLINIC_OR_DEPARTMENT_OTHER): Payer: Self-pay

## 2021-08-23 ENCOUNTER — Telehealth: Payer: 59 | Admitting: Family Medicine

## 2021-08-23 ENCOUNTER — Other Ambulatory Visit: Payer: Self-pay

## 2021-08-23 ENCOUNTER — Ambulatory Visit
Admission: EM | Admit: 2021-08-23 | Discharge: 2021-08-23 | Disposition: A | Discharge status: Home / Self Care | Payer: 59 | Attending: Emergency Medicine | Admitting: Emergency Medicine

## 2021-08-23 ENCOUNTER — Telehealth: Payer: 59 | Admitting: Adult Health

## 2021-08-23 DIAGNOSIS — R058 Other specified cough: Secondary | ICD-10-CM

## 2021-08-23 DIAGNOSIS — J069 Acute upper respiratory infection, unspecified: Secondary | ICD-10-CM

## 2021-08-23 DIAGNOSIS — J019 Acute sinusitis, unspecified: Secondary | ICD-10-CM | POA: Diagnosis not present

## 2021-08-23 MED ORDER — PSEUDOEPHEDRINE HCL 30 MG PO TABS
60.0000 mg | ORAL_TABLET | Freq: Three times a day (TID) | ORAL | 0 refills | Status: DC | PRN
  Filled 2021-08-23: qty 24, 4d supply, fill #0

## 2021-08-23 MED ORDER — AMOXICILLIN-POT CLAVULANATE 875-125 MG PO TABS
1.0000 | ORAL_TABLET | Freq: Two times a day (BID) | ORAL | 0 refills | Status: AC
  Filled 2021-08-23: qty 20, 10d supply, fill #0

## 2021-08-23 MED ORDER — IPRATROPIUM BROMIDE 0.03 % NA SOLN
2.0000 | Freq: Two times a day (BID) | NASAL | 0 refills | Status: DC
Start: 2021-08-23 — End: 2022-02-07
  Filled 2021-08-23: qty 30, 43d supply, fill #0

## 2021-08-23 MED ORDER — FLUCONAZOLE 150 MG PO TABS
150.0000 mg | ORAL_TABLET | ORAL | 0 refills | Status: AC
  Filled 2021-08-23: qty 3, 9d supply, fill #0

## 2021-08-23 NOTE — ED Triage Notes (Signed)
Pt presents with left ear pressure, cough, facial pain that began last Wednesday and have since worsened.

## 2021-08-23 NOTE — ED Provider Notes (Signed)
[facility]    CSN: [id number] Arrival date & time: 08/23/21  0102      History   Chief Complaint Chief Complaint  Patient presents with   Nasal Congestion   Emesis   Cough   ear pressure    HPI Madison [name] is a 35 y.o. female.   Patient complains of 7 days of worsening symptoms of left ear pressure, cough and facial pain.  Patient states she is planning to run a half marathon in 4 days.  Patient states she has been utilizing a regimen of saline rinse with Nettie pot, Sudafed purchased off-the-shelf and Mucinex along with ibuprofen for headache as needed with no improvement.  Patient denies fever, aches, chills, nausea, vomiting, diarrhea, known exposure to infectious virus.  The history is provided by the patient.   Past Medical History:  Diagnosis Date   Allergy    Asthma    Hx of migraines 01/012003   can tell when they come on, treats with excedrin migraine.   Scoliosis 10/24/1995   Vitamin D deficiency     Patient Active Problem List   Diagnosis Date Noted   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   Chronic urticaria 05/11/2021   Other adverse food reactions, not elsewhere classified, subsequent encounter 05/11/2021   Seasonal and perennial allergic rhinitis 05/11/2021   Mild intermittent asthma without complication 72/53/6644   Trigger point of right shoulder region 04/07/2020   Nonallopathic lesion of lumbosacral region 11/07/2018   Nonallopathic lesion of sacral region 11/07/2018   Nonallopathic lesion of thoracic region 11/07/2018   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 Authorizing Provider  amoxicillin-clavulanate (AUGMENTIN) 875-125 MG tablet Take 1 tablet by mouth every 12 hours for 10 days. 08/23/21 09/02/21 Yes Lynden Oxford Scales, PA-C  fluconazole (DIFLUCAN) 150 MG tablet Take 1 tablet (150 mg total) by mouth every 3 days for 3 doses. 08/23/21 09/01/21 Yes Lynden Oxford Scales, PA-C  ipratropium (ATROVENT) 0.03 % nasal spray Place 2 sprays into both nostrils every 12 (twelve) hours. 08/23/21  Yes Lynden Oxford Scales, PA-C  pseudoephedrine (SUDAFED) 30 MG tablet Take 2 tablets (60 mg total) by mouth every 8 (eight) hours as needed for congestion. 08/23/21  Yes Lynden Oxford Scales, PA-C  albuterol (VENTOLIN HFA) 108 (90 Base) MCG/ACT inhaler Inhale 2 puffs into the lungs every 4 (four) hours as needed for wheezing or shortness of breath. 05/11/21   Garnet Sierras, DO  COVID-19 [facility] Antigen Test KIT Use as directed. 07/28/21   Margie Ege, RPH  cyclobenzaprine (FLEXERIL) 5 MG tablet Take 1-2 tablets (5-10 mg total) by mouth at bedtime as needed  for muscle spasms. 01/01/20   Gregor Hams, MD  fexofenadine (ALLEGRA) 30 MG tablet Take 30 mg by mouth daily.     [provider]  fluticasone (FLONASE) 50 MCG/ACT nasal spray Place 2 sprays into both nostrils daily. Patient not taking: Reported on 08/23/2021 06/01/21   Mar Daring, PA-C  Multiple Vitamin (MULTIVITAMIN WITH MINERALS) TABS tablet Take 1 tablet by mouth daily.    [provider]  norethindrone-ethinyl estradiol (JUNEL 1/20) 1-20 MG-MCG tablet Take 1 tablet by mouth once a day 08/22/21     Norethindrone-Ethinyl Estradiol-Fe Biphas (LO LOESTRIN FE) 1 MG-10 MCG / 10 MCG tablet Take 1 tablet by mouth once daily. 07/19/21   Christophe Louis, MD  Probiotic Product (PROBIOTIC PO) Take by mouth.     [provider]  Spacer/Aero-Holding Chambers (AEROCHAMBER PLUS WITH MASK) inhaler Use as directed. 12/07/18   Kara Dies, NP    Family History Family History  Problem Relation Age of Onset   Breast cancer Maternal Grandmother 42   Osteoporosis Maternal Grandmother    Heart failure Maternal Grandmother    COPD Maternal Grandmother    Healthy Mother    Hearing loss Father    Other Father        prostate level elevation   Diabetes Mellitus II Brother        TWIN   Prostate cancer Maternal Grandfather        w mets   Heart disease Maternal Grandfather    Heart failure Maternal Grandfather    Brain cancer Paternal Grandmother    Melanoma Paternal Grandfather    Colon cancer Paternal Aunt     Social History Social History   Tobacco Use   Smoking status: Never   Smokeless tobacco: Never  Vaping Use   Vaping Use: Never used  Substance Use Topics   Alcohol use: Yes    Comment: occasionally   Drug use: No     Allergies   Prednisone and Meloxicam   Review of Systems Review of Systems Pertinent findings noted in history of present illness.    Physical Exam Triage Vital Signs ED Triage Vitals  Enc Vitals Group     BP 08/19/21 0827 (!) 147/82     Pulse Rate 08/19/21 0827 72     Resp 08/19/21 0827 18     Temp 08/19/21 0827 98.3 F (36.8 C)     Temp Source 08/19/21 0827 Oral     SpO2 08/19/21 0827 98 %     Weight --      Height --      Head Circumference --      Peak Flow --      Pain Score 08/19/21 0826 5     Pain Loc --      Pain Edu? --      Excl. in [place]? --    No data found.  Updated Vital Signs BP 118/86 (BP Location: Left Arm)   Pulse 78   Temp 98.6 F (37 C) (Oral)   Resp 12   SpO2 98%   Visual Acuity Right Eye Distance:   Left Eye Distance:   Bilateral Distance:    Right Eye Near:   Left Eye Near:    Bilateral Near:     Physical Exam Vitals and nursing note reviewed.  Constitutional:      General: She is not in  acute distress.    Appearance: Normal appearance. She is not ill-appearing.  HENT:  Head: Normocephalic and atraumatic.     Salivary Glands: Right salivary gland is not diffusely enlarged or tender. Left salivary gland is not diffusely enlarged or tender.     Right Ear: Tympanic membrane, ear canal and external ear normal. No drainage. No middle ear effusion. There is no impacted cerumen. Tympanic membrane is not erythematous or bulging.     Left Ear: Tympanic membrane, ear canal and external ear normal. No drainage.  No middle ear effusion. There is no impacted cerumen. Tympanic membrane is not erythematous or bulging.     Nose: Nose normal. No nasal deformity, septal deviation, mucosal edema, congestion or rhinorrhea.     Right Turbinates: Not enlarged, swollen or pale.     Left Turbinates: Not enlarged, swollen or pale.     Right Sinus: No maxillary sinus tenderness or frontal sinus tenderness.     Left Sinus: No maxillary sinus tenderness or frontal sinus tenderness.     Mouth/Throat:     Lips: Pink. No lesions.     Mouth: Mucous membranes are moist. No oral lesions.     Pharynx: Oropharynx is clear. Uvula midline. No posterior oropharyngeal erythema or uvula swelling.     Tonsils: No tonsillar exudate. 0 on the right. 0 on the left.  Eyes:     General: Lids are normal.        Right eye: No discharge.        Left eye: No discharge.     Extraocular Movements: Extraocular movements intact.     Conjunctiva/sclera: Conjunctivae normal.     Right eye: Right conjunctiva is not injected.     Left eye: Left conjunctiva is not injected.  Neck:     Trachea: Trachea and phonation normal.  Cardiovascular:     Rate and Rhythm: Normal rate and regular rhythm.     Pulses: Normal pulses.     Heart sounds: Normal heart sounds. No murmur heard.   No friction rub. No gallop.  Pulmonary:     Effort: Pulmonary effort is normal. No accessory muscle usage, prolonged expiration or respiratory  distress.     Breath sounds: Normal breath sounds. No stridor, decreased air movement or transmitted upper airway sounds. No decreased breath sounds, wheezing, rhonchi or rales.  Chest:     Chest wall: No tenderness.  Musculoskeletal:        General: Normal range of motion.     Cervical back: Normal range of motion and neck supple. Normal range of motion.  Lymphadenopathy:     Cervical: No cervical adenopathy.  Skin:    General: Skin is warm and dry.     Findings: No erythema or rash.  Neurological:     General: No focal deficit present.     Mental Status: She is alert and oriented to person, place, and time.  Psychiatric:        Mood and Affect: Mood normal.        Behavior: Behavior normal.     UC Treatments / Results  Labs (all labs ordered are listed, but only abnormal results are displayed) Labs Reviewed  COVID-19, FLU A+B AND RSV    EKG   Radiology No results found.  Procedures Procedures (including critical care time)  Medications Ordered in UC Medications - No data to display  Initial Impression / Assessment and Plan / UC Course  I have reviewed the triage vital signs and the nursing notes.  Pertinent labs & imaging results that were available during my care  of the patient were reviewed by me and considered in my medical decision making (see chart for details).     Physical exam today is unremarkable.  Patient is requesting antibiotics at this time given her upcoming half marathon.  Patient advised that it is more than likely that her sinus infection is viral in etiology and that antibiotics may be of absolutely no benefit I am happy to prescribe them for her today as that was the purpose for her visit today.  Patient verbalized understanding and agreement of plan as discussed.  All questions were addressed during visit.  Please see discharge instructions below for further details of plan.  Final Clinical Impressions(s) / UC Diagnoses   Final diagnoses:   Acute sinusitis, recurrence not specified, unspecified location  Acute upper respiratory infection  Productive cough  Cough productive of purulent sputum     Discharge Instructions      Begin Augmentin 1 tablet twice daily for presumed bacterial sinusitis.  If you should develop a yeast infection, I have also sent prescription for fluconazole to your pharmacy that you can fill as needed.    Please continue using your Nettie pot and Mucinex.    I have sent a prescription for the original version of Sudafed which is much more effective than the denatured version available off-the-shelf, please take 2 tablets up to 3 times daily to mucous membranes.    Please also spray to sprays of Atrovent into your nose 4 times daily to also assist with drying up mucous membranes and reducing edema allowing existing mucus to drain more easily.  I mentioned, there are no contraindications to receiving COVID vaccination or boosters, please feel free to get your COVID booster this coming Friday.  We will notify you of the results of your COVID, influenza and RSV testing once they are available, the result should be posted to your MyChart account.     ED Prescriptions     Medication Sig Dispense Auth. Provider   pseudoephedrine (SUDAFED) 30 MG tablet Take 2 tablets (60 mg total) by mouth every 8 (eight) hours as needed for congestion. 24 tablet Lynden Oxford Scales, PA-C   ipratropium (ATROVENT) 0.03 % nasal spray Place 2 sprays into both nostrils every 12 (twelve) hours. 30 mL Lynden Oxford Scales, PA-C   amoxicillin-clavulanate (AUGMENTIN) 875-125 MG tablet Take 1 tablet by mouth every 12 hours for 10 days. 20 tablet Lynden Oxford Scales, PA-C   fluconazole (DIFLUCAN) 150 MG tablet Take 1 tablet (150 mg total) by mouth every 3 days for 3 doses. 3 tablet Lynden Oxford Scales, PA-C      PDMP not reviewed this encounter.    Lynden Oxford Scales, PA-C 08/23/21 1818

## 2021-08-23 NOTE — Discharge Instructions (Addendum)
Begin Augmentin 1 tablet twice daily for presumed bacterial sinusitis.  If you should develop a yeast infection, I have also sent prescription for fluconazole to your pharmacy that you can fill as needed.    Please continue using your Nettie pot and Mucinex.    I have sent a prescription for the original version of Sudafed which is much more effective than the denatured version available off-the-shelf, please take 2 tablets up to 3 times daily to mucous membranes.    Please also spray to sprays of Atrovent into your nose 4 times daily to also assist with drying up mucous membranes and reducing edema allowing existing mucus to drain more easily.  I mentioned, there are no contraindications to receiving COVID vaccination or boosters, please feel free to get your COVID booster this coming Friday.  We will notify you of the results of your COVID, influenza and RSV testing once they are available, the result should be posted to your MyChart account.

## 2021-08-24 ENCOUNTER — Other Ambulatory Visit (HOSPITAL_BASED_OUTPATIENT_CLINIC_OR_DEPARTMENT_OTHER): Payer: Self-pay

## 2021-08-25 ENCOUNTER — Other Ambulatory Visit (HOSPITAL_BASED_OUTPATIENT_CLINIC_OR_DEPARTMENT_OTHER): Payer: Self-pay

## 2021-08-25 LAB — COVID-19, FLU A+B AND RSV
Influenza A, NAA: NOT DETECTED
Influenza B, NAA: NOT DETECTED
RSV, NAA: NOT DETECTED
SARS-CoV-2, NAA: NOT DETECTED

## 2021-08-26 ENCOUNTER — Ambulatory Visit: Payer: 59 | Attending: Internal Medicine

## 2021-08-26 ENCOUNTER — Ambulatory Visit: Payer: 59

## 2021-08-26 ENCOUNTER — Other Ambulatory Visit: Payer: Self-pay

## 2021-08-26 ENCOUNTER — Other Ambulatory Visit (HOSPITAL_BASED_OUTPATIENT_CLINIC_OR_DEPARTMENT_OTHER): Payer: Self-pay

## 2021-08-26 DIAGNOSIS — Z23 Encounter for immunization: Secondary | ICD-10-CM

## 2021-08-26 MED ORDER — PFIZER COVID-19 VAC BIVALENT 30 MCG/0.3ML IM SUSP
INTRAMUSCULAR | 0 refills | Status: DC
  Filled 2021-08-26: qty 0.3, 1d supply, fill #0

## 2021-08-26 NOTE — Progress Notes (Signed)
   Covid-19 Vaccination Clinic  Name:  Madison [name]    MRN: [id number] DOB: 1986-02-04  08/26/2021  [name] was observed post Covid-19 immunization for 15 minutes without incident. She was provided with Vaccine Information Sheet and instruction to access the V-Safe system.   [name] was instructed to call 911 with any severe reactions post vaccine: Difficulty breathing  Swelling of face and throat  A fast heartbeat  A bad rash all over body  Dizziness and weakness   Immunizations Administered     Name Date Dose VIS Date Route   Pfizer Covid-19 Vaccine Bivalent Booster 08/26/2021  2:34 PM 0.3 mL 06/22/2021 Intramuscular   Manufacturer: [place]   Lot: [id number]   [place]: [phone]

## 2021-09-12 NOTE — Progress Notes (Signed)
Madison  [name] [address] [place] Phone: [phone] Subjective:   Madison [name], am serving as a scribe for Dr. Hulan Saas. This visit occurred during the SARS-CoV-2 public health emergency.  Safety protocols were in place, including screening questions prior to the visit, additional usage of staff PPE, and extensive cleaning of exam room while observing appropriate contact time as indicated for disinfecting solutions.   I'm seeing this patient by the request  of:  Koberlein, Steele Berg, MD  CC: Neck and back pain follow-up  [id number]  Madison [name] is a 35 y.o. female coming in with complaint of back and neck pain. OMT 07/28/2021. Patient states pain between right shoulder blade and spine. Radiates into the neck. Just ran half marathon.  Feeling good overall.  Did have some pain after the run in her knees and ankles.  Have ran 2 times since the half marathon without any significant difficulty.  Did have tightness in the same areas.  Did have a massage to try to help with the right upper back pain but did not make significant improvement  Medications patient has been prescribed: None  Taking:         Reviewed prior external information including notes and imaging from previsou exam, outside providers and external EMR if available.   As well as notes that were available from care everywhere and other healthcare systems.  Past medical history, social, surgical and family history all reviewed in electronic medical record.  No pertanent information unless stated regarding to the chief complaint.   Past Medical History:  Diagnosis Date   Allergy    Asthma    Hx of migraines 01/012003   can tell when they come on, treats with excedrin migraine.   Scoliosis 10/24/1995   Vitamin D deficiency     Allergies  Allergen Reactions   Prednisone     Tingling in hands and face   Meloxicam     heartburn     Review of  Systems:  No headache, visual changes, nausea, vomiting, diarrhea, constipation, dizziness, abdominal pain, skin rash, fevers, chills, night sweats, weight loss, swollen lymph nodes, body aches, joint swelling, chest pain, shortness of breath, mood changes. POSITIVE muscle aches  Objective  Blood pressure 106/68, pulse 89, height 5' 3"  (1.6 m), weight 140 lb (63.5 kg), SpO2 99 %.   General: No apparent distress alert and oriented x3 mood and affect normal, dressed appropriately.  HEENT: Pupils equal, extraocular movements intact  Respiratory: Patient's speak in full sentences and does not appear short of breath  Cardiovascular: No lower extremity edema, non tender, no erythema  Significant scoliosis noted with his patient's baseline.  Diffuse tenderness to palpation mostly in the thoracolumbar juncture.  Mild increase in discomfort over the sacroiliac joint on the left side.  Mild positive FABER test.  Osteopathic findings T4-6 neutral rotated left side bent right with inhaled fifth rib T9 extended rotated and side bent left L4 flexed rotated and side bent right Sacrum left on left       Assessment and Plan:  Scoliosis Chronic, with mild exacerbation.  Patient does have the Flexeril for any type of breakthrough pain.  We discussed with patient about treatment options.  Is sleeping with a kitten and does think that that is contributing.  We will monitor and continue with the intervals with manipulation every 6 to 8 weeks.   Nonallopathic problems  Decision today to treat with OMT was  based on Physical Exam  After verbal consent patient was treated with HVLA, ME, FPR techniques in  rib, thoracic, lumbar, and sacral  areas  Patient tolerated the procedure well with improvement in symptoms  Patient given exercises, stretches and lifestyle modifications  See medications in patient instructions if given  Patient will follow up in 4-8 weeks      The above documentation has been  reviewed and is accurate and complete Lyndal Pulley, DO        Note: This dictation was prepared with Dragon dictation along with smaller phrase technology. Any transcriptional errors that result from this process are unintentional.

## 2021-09-13 ENCOUNTER — Ambulatory Visit: Payer: 59 | Admitting: Family Medicine

## 2021-09-19 ENCOUNTER — Other Ambulatory Visit: Payer: Self-pay

## 2021-09-19 ENCOUNTER — Ambulatory Visit: Payer: 59 | Admitting: Family Medicine

## 2021-09-19 VITALS — BP 106/68 | HR 89 | Ht 63.0 in | Wt 140.0 lb

## 2021-09-19 DIAGNOSIS — M9902 Segmental and somatic dysfunction of thoracic region: Secondary | ICD-10-CM | POA: Diagnosis not present

## 2021-09-19 DIAGNOSIS — M9904 Segmental and somatic dysfunction of sacral region: Secondary | ICD-10-CM

## 2021-09-19 DIAGNOSIS — M41125 Adolescent idiopathic scoliosis, thoracolumbar region: Secondary | ICD-10-CM

## 2021-09-19 DIAGNOSIS — M9908 Segmental and somatic dysfunction of rib cage: Secondary | ICD-10-CM

## 2021-09-19 DIAGNOSIS — M9903 Segmental and somatic dysfunction of lumbar region: Secondary | ICD-10-CM

## 2021-09-19 NOTE — Patient Instructions (Signed)
Good to see you  You are doing great  Be proud of what you accomplished See me again in 6-8 weeks

## 2021-09-19 NOTE — Assessment & Plan Note (Signed)
Chronic, with mild exacerbation.  Patient does have the Flexeril for any type of breakthrough pain.  We discussed with patient about treatment options.  Is sleeping with a kitten and does think that that is contributing.  We will monitor and continue with the intervals with manipulation every 6 to 8 weeks.

## 2021-10-19 ENCOUNTER — Other Ambulatory Visit: Payer: Self-pay

## 2021-10-19 ENCOUNTER — Other Ambulatory Visit: Payer: Self-pay | Admitting: Pharmacist

## 2021-10-24 ENCOUNTER — Other Ambulatory Visit: Payer: Self-pay

## 2021-11-04 NOTE — Progress Notes (Signed)
Madison [name]  [place] [address] [name] Phone: [phone] Subjective:   Madison [name], am serving as a scribe for Dr. Hulan Saas. This visit occurred during the SARS-CoV-2 public health emergency.  Safety protocols were in place, including screening questions prior to the visit, additional usage of staff PPE, and extensive cleaning of exam room while observing appropriate contact time as indicated for disinfecting solutions.   I'm seeing this patient by the request  of:  Koberlein, Steele Berg, MD  CC: Right upper back and lower back pain  [name]  Madison [name] is a 36 y.o. female coming in with complaint of back and neck pain. OMT 09/19/2021. Patient states right shoulder blade area. Waking up in the night after laying flat and sensation is lost in right hand. Laying on side brings the sensation back. No there complaints.  Medications patient has been prescribed: None  Taking:         Reviewed prior external information including notes and imaging from previsou exam, outside providers and external EMR if available.   As well as notes that were available from care everywhere and other healthcare systems.  Past medical history, social, surgical and family history all reviewed in electronic medical record.  No pertanent information unless stated regarding to the chief complaint.   Past Medical History:  Diagnosis Date   Allergy    Asthma    Hx of migraines 01/012003   can tell when they come on, treats with excedrin migraine.   Scoliosis 10/24/1995   Vitamin D deficiency     Allergies  Allergen Reactions   Prednisone     Tingling in hands and face   Meloxicam     heartburn     Review of Systems:  No headache, visual changes, nausea, vomiting, diarrhea, constipation, dizziness, abdominal pain, skin rash, fevers, chills, night sweats, weight loss, swollen lymph nodes, body aches, joint swelling, chest pain,  shortness of breath, mood changes. POSITIVE muscle aches  Objective  Blood pressure 98/60, pulse 90, height 5' 3"  (1.6 m), weight 141 lb (64 kg), SpO2 99 %.   General: No apparent distress alert and oriented x3 mood and affect normal, dressed appropriately.  HEENT: Pupils equal, extraocular movements intact  Respiratory: Patient's speak in full sentences and does not appear short of breath  Cardiovascular: No lower extremity edema, non tender, no erythema  Right wrist exam shows the patient does have a positive Tinel's and a positive Phalen's.  No thenar eminence wasting no noted. Patient's back still has a scoliosis noted.  Tenderness to palpation in the paraspinal musculature.  Osteopathic findings   T4 extended rotated and side bent right L2 flexed rotated and side bent right Sacrum right on right       Assessment and Plan:  Scoliosis Chronic problem with exacerbation.  The patient does have some increasing discomfort.  Discussed taking the muscle relaxer on a more regular basis.  Discussed watching posture and ergonomics.  Do feel that stress is playing a role.  Follow-up again in 6 to 8 weeks  Carpal tunnel syndrome on right We discussed the anatomy involved, and that carpal tunnel syndrome primarily involves the median nerve, and this typically affects digits one through 3.   We also discussed that mild cases of carpal tunnel syndrome are often improved with night splints.  If symptoms persist it is very reasonable to consider a carpal tunnel injection.   If the patient does have moderate to  severe carpal tunnel syndrome based on NCV, then it is certainly reasonable to consider surgical consultation for definitive management possible carpal tunnel release.  We also discussed his severe carpal tunnel syndrome can lead to permanent nerve impairment even if released. At this point, the patient like to proceed conservatively.    Nonallopathic problems  Decision today to treat  with OMT was based on Physical Exam  After verbal consent patient was treated with HVLA, ME, FPR techniques in cervical, rib, thoracic, lumbar, and sacral  areas  Patient tolerated the procedure well with improvement in symptoms  Patient given exercises, stretches and lifestyle modifications  See medications in patient instructions if given  Patient will follow up in 4-8 weeks      The above documentation has been reviewed and is accurate and complete Lyndal Pulley, DO       Note: This dictation was prepared with Dragon dictation along with smaller phrase technology. Any transcriptional errors that result from this process are unintentional.

## 2021-11-07 ENCOUNTER — Other Ambulatory Visit: Payer: Self-pay

## 2021-11-07 ENCOUNTER — Ambulatory Visit: Payer: No Typology Code available for payment source | Admitting: Family Medicine

## 2021-11-07 VITALS — BP 98/60 | HR 90 | Ht 63.0 in | Wt 141.0 lb

## 2021-11-07 DIAGNOSIS — M9902 Segmental and somatic dysfunction of thoracic region: Secondary | ICD-10-CM

## 2021-11-07 DIAGNOSIS — M41125 Adolescent idiopathic scoliosis, thoracolumbar region: Secondary | ICD-10-CM

## 2021-11-07 DIAGNOSIS — M9904 Segmental and somatic dysfunction of sacral region: Secondary | ICD-10-CM | POA: Diagnosis not present

## 2021-11-07 DIAGNOSIS — M9903 Segmental and somatic dysfunction of lumbar region: Secondary | ICD-10-CM

## 2021-11-07 DIAGNOSIS — G5601 Carpal tunnel syndrome, right upper limb: Secondary | ICD-10-CM

## 2021-11-07 NOTE — Assessment & Plan Note (Signed)
Chronic problem with exacerbation.  The patient does have some increasing discomfort.  Discussed taking the muscle relaxer on a more regular basis.  Discussed watching posture and ergonomics.  Do feel that stress is playing a role.  Follow-up again in 6 to 8 weeks

## 2021-11-07 NOTE — Assessment & Plan Note (Signed)
We discussed the anatomy involved, and that carpal tunnel syndrome primarily involves the median nerve, and this typically affects digits one through 3.   We also discussed that mild cases of carpal tunnel syndrome are often improved with night splints.  If symptoms persist it is very reasonable to consider a carpal tunnel injection.   If the patient does have moderate to severe carpal tunnel syndrome based on NCV, then it is certainly reasonable to consider surgical consultation for definitive management possible carpal tunnel release.  We also discussed his severe carpal tunnel syndrome can lead to permanent nerve impairment even if released. At this point, the patient like to proceed conservatively.

## 2021-11-07 NOTE — Patient Instructions (Addendum)
Do prescribed exercises at least 3x a week Brace for 2 weeks AM and PM, then 2 weeks PM only See you again in 4 weeks

## 2021-11-09 ENCOUNTER — Encounter: Payer: Self-pay | Admitting: Family Medicine

## 2021-11-24 ENCOUNTER — Other Ambulatory Visit (HOSPITAL_BASED_OUTPATIENT_CLINIC_OR_DEPARTMENT_OTHER): Payer: Self-pay

## 2021-11-24 ENCOUNTER — Other Ambulatory Visit: Payer: Self-pay | Admitting: Pharmacist

## 2021-11-24 MED ORDER — COVID-19 AT HOME ANTIGEN TEST VI KIT
PACK | 0 refills | Status: DC
  Filled 2021-11-24: qty 2, 4d supply, fill #0

## 2021-12-02 NOTE — Progress Notes (Signed)
[place] Phone: [phone] Subjective:   Madison Madison [name], am serving as a scribe for Dr. Hulan Saas.  This visit occurred during the SARS-CoV-2 public health emergency.  Safety protocols were in place, including screening questions prior to the visit, additional usage of staff PPE, and extensive cleaning of exam room while observing appropriate contact time as indicated for disinfecting solutions.   I'm seeing this patient by the request  of:  Koberlein, Steele Berg, MD  CC: back and neck pain   [id number]  Madison Madison [name] is a 36 y.o. female coming in with complaint of back and neck pain. OMT 11/07/2021. Also f/u for R carpal tunnel. Patient states that she has had some catching and pain on R side of cervical spine.   Wrist pain is improving. Had some numbness in the brace but not consistently. Has had only 2 days of numbness in last 4 weeks.  Patient has also had a significant amount of stress recently.  Husband has been diagnosed with kidney cancer  Medications patient has been prescribed: None       Reviewed prior external information including notes and imaging from previsou exam, outside providers and external EMR if available.   As well as notes that were available from care everywhere and other healthcare systems.  Past medical history, social, surgical and family history all reviewed in electronic medical record.  Madison [name] pertanent information unless stated regarding to the chief complaint.   Past Medical History:  Diagnosis Date   Allergy    Asthma    Hx of migraines 01/012003   can tell when they come on, treats with excedrin migraine.   Scoliosis 10/24/1995   Vitamin D deficiency     Allergies  Allergen Reactions   Prednisone     Tingling in hands and face   Meloxicam     heartburn     Review of Systems:  Madison [name] headache, visual changes, nausea, vomiting, diarrhea, constipation,  dizziness, abdominal pain, skin rash, fevers, chills, night sweats, weight loss, swollen lymph nodes, joint swelling, chest pain, shortness of breath, mood changes. POSITIVE muscle aches, body aches  Objective  Blood pressure 102/76, pulse 89, height 5' 3"  (1.6 m), weight 140 lb (63.5 kg).   General: Madison [name] apparent distress alert and oriented x3 mood and affect normal, dressed appropriately.  HEENT: Pupils equal, extraocular movements intact  Respiratory: Patient's speak in full sentences and does not appear short of breath  Cardiovascular: Madison [name] lower extremity edema, non tender, Madison [name] erythema  Scoliosis noted   Osteopathic findings  C2 flexed rotated and side bent right C6 flexed rotated and side bent left T3 extended rotated and side bent right inhaled rib L1-4 neutral rotated left side bent right Sacrum right on right       Assessment and Plan:  Scoliosis Chronic, with mild exacerbation.  Still responds extremely well to osteopathic manipulation.  Discussed icing regimen and home exercises.  Patient will continue with the home exercises.  Increase activity slowly.  Follow-up again in 6 to 8 weeks    Nonallopathic problems  Decision today to treat with OMT was based on Physical Exam  After verbal consent patient was treated with HVLA, ME, FPR techniques in cervical, rib, thoracic, lumbar, and sacral  areas  Patient tolerated the procedure well with improvement in symptoms  Patient given exercises, stretches and lifestyle modifications  See medications in patient instructions if given  Patient will follow  up in 4-8 weeks      The above documentation has been reviewed and is accurate and complete Madison Pulley, DO        Note: This dictation was prepared with Dragon dictation along with smaller phrase technology. Any transcriptional errors that result from this process are unintentional.

## 2021-12-05 ENCOUNTER — Other Ambulatory Visit (HOSPITAL_BASED_OUTPATIENT_CLINIC_OR_DEPARTMENT_OTHER): Payer: Self-pay

## 2021-12-05 ENCOUNTER — Ambulatory Visit (INDEPENDENT_AMBULATORY_CARE_PROVIDER_SITE_OTHER): Payer: No Typology Code available for payment source | Admitting: Family Medicine

## 2021-12-05 ENCOUNTER — Other Ambulatory Visit: Payer: Self-pay

## 2021-12-05 VITALS — BP 102/76 | HR 89 | Ht 63.0 in | Wt 140.0 lb

## 2021-12-05 DIAGNOSIS — M9902 Segmental and somatic dysfunction of thoracic region: Secondary | ICD-10-CM | POA: Diagnosis not present

## 2021-12-05 DIAGNOSIS — M9903 Segmental and somatic dysfunction of lumbar region: Secondary | ICD-10-CM

## 2021-12-05 DIAGNOSIS — M9901 Segmental and somatic dysfunction of cervical region: Secondary | ICD-10-CM

## 2021-12-05 DIAGNOSIS — M41125 Adolescent idiopathic scoliosis, thoracolumbar region: Secondary | ICD-10-CM | POA: Diagnosis not present

## 2021-12-05 DIAGNOSIS — M9908 Segmental and somatic dysfunction of rib cage: Secondary | ICD-10-CM | POA: Diagnosis not present

## 2021-12-05 DIAGNOSIS — M9904 Segmental and somatic dysfunction of sacral region: Secondary | ICD-10-CM

## 2021-12-05 MED ORDER — HYDROXYZINE HCL 10 MG PO TABS
10.0000 mg | ORAL_TABLET | Freq: Three times a day (TID) | ORAL | 0 refills | Status: DC | PRN
  Filled 2021-12-05: qty 30, 10d supply, fill #0

## 2021-12-05 NOTE — Assessment & Plan Note (Signed)
Chronic, with mild exacerbation.  Still responds extremely well to osteopathic manipulation.  Discussed icing regimen and home exercises.  Patient will continue with the home exercises.  Increase activity slowly.  Follow-up again in 6 to 8 weeks

## 2021-12-05 NOTE — Patient Instructions (Signed)
Take time for yourself We are here for you [phone] See me in 3-4 weeks

## 2021-12-22 NOTE — Progress Notes (Signed)
?Charlann Boxer D.O. ?[facility] ?[place] ?Phone: [phone] ?Subjective:   ?I, Madison [name], am serving as a Education administrator for Dr. Hulan Saas. ?This visit occurred during the SARS-CoV-2 public health emergency.  Safety protocols were in place, including screening questions prior to the visit, additional usage of staff PPE, and extensive cleaning of exam room while observing appropriate contact time as indicated for disinfecting solutions.  ? ?I'm seeing this patient by the request  of:  Koberlein, Steele Berg, MD ? ?CC: Neck and back pain follow-up ? ?[id number]  ?Madison [name] is a 36 y.o. female coming in with complaint of back and neck pain. OMT 12/05/2021. Patient states same per usual. No new complaints.  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  Still somewhat stressed.  Has the muscle relaxer to take at night when needed.  The patient states that when she was sleeping in the hospital or when she was on the couch that seem to be worse.  Denies any new symptoms or is worsening of previous symptoms. ? ?Medications patient has been prescribed: Hydroxizine ? ?Taking: ? ? ?  ? ? ? ? ?Reviewed prior external information including notes and imaging from previsou exam, outside providers and external EMR if available.  ? ?As well as notes that were available from care everywhere and other healthcare systems. ? ?Past medical history, social, surgical and family history all reviewed in electronic medical record.  No pertanent information unless stated regarding to the chief complaint.  ? ?Past Medical History:  ?Diagnosis Date  ? Allergy   ? Asthma   ? Hx of migraines 01/012003  ? can tell when they come on, treats with excedrin migraine.  ? Scoliosis 10/24/1995  ? Vitamin D deficiency   ?  ?Allergies  ?Allergen Reactions  ? Prednisone   ?  Tingling in hands and face  ? Meloxicam   ?  heartburn  ? ? ? ?Review of Systems: ? No headache, visual changes,  nausea, vomiting, diarrhea, constipation, dizziness, abdominal pain, skin rash, fevers, chills, night sweats, weight loss, swollen lymph nodes, body aches, joint swelling, chest pain, shortness of breath, mood changes. POSITIVE muscle aches ? ?Objective  ?Blood pressure 98/62, pulse 88, height 5' 3"  (1.6 m), weight 140 lb (63.5 kg), SpO2 98 %. ?  ?General: No apparent distress alert and oriented x3 mood and affect normal, dressed appropriately.  ?HEENT: Pupils equal, extraocular movements intact  ?Respiratory: Patient's speak in full sentences and does not appear short of breath  ?Cardiovascular: No lower extremity edema, non tender, no erythema  ?Back exam does have some loss of lordosis.  Some tenderness to palpation in the paraspinal musculature.  Scoliosis still noted with no significant changes. ? ?Osteopathic findings ? ?C3  Flexed rotated and side bent left ?T7 extended rotated and side bent left inhaled rib ?L1 flexed rotated and side bent right ?L5 flexed rotated and side bent left ?Sacrum right on right ? ? ? ? ?  ?Assessment and Plan: ? ?Scoliosis ?Patient does have scoliosis noted.  Patient is doing relatively well but has not been quite as active as usual with patient helping with her husband and his recent surgery.  Discussed with patient to continue to take time for herself.  Responding well to osteopathic manipulation.  Has a muscle relaxer if she needs it. F/u 6 weeks   ? ?Nonallopathic problems ? ?Decision today to treat with OMT was based on Physical Exam ? ?  After verbal consent patient was treated with HVLA, ME, FPR techniques in  thoracic, lumbar, and sacral  areas ? ?Patient tolerated the procedure well with improvement in symptoms ? ?Patient given exercises, stretches and lifestyle modifications ? ?See medications in patient instructions if given ? ?Patient will follow up in 4-8 weeks ? ?  ? ? ?The above documentation has been reviewed and is accurate and complete Lyndal Pulley, DO ? ? ? ?   ? ? Note: This dictation was prepared with Dragon dictation along with smaller phrase technology. Any transcriptional errors that result from this process are unintentional.    ?  ?  ? ?

## 2021-12-28 ENCOUNTER — Other Ambulatory Visit: Payer: Self-pay

## 2021-12-28 ENCOUNTER — Ambulatory Visit: Payer: No Typology Code available for payment source | Admitting: Family Medicine

## 2021-12-28 VITALS — BP 98/62 | HR 88 | Ht 63.0 in | Wt 140.0 lb

## 2021-12-28 DIAGNOSIS — M9904 Segmental and somatic dysfunction of sacral region: Secondary | ICD-10-CM

## 2021-12-28 DIAGNOSIS — M9908 Segmental and somatic dysfunction of rib cage: Secondary | ICD-10-CM

## 2021-12-28 DIAGNOSIS — M9901 Segmental and somatic dysfunction of cervical region: Secondary | ICD-10-CM

## 2021-12-28 DIAGNOSIS — M9903 Segmental and somatic dysfunction of lumbar region: Secondary | ICD-10-CM

## 2021-12-28 DIAGNOSIS — M9902 Segmental and somatic dysfunction of thoracic region: Secondary | ICD-10-CM

## 2021-12-28 DIAGNOSIS — M41125 Adolescent idiopathic scoliosis, thoracolumbar region: Secondary | ICD-10-CM

## 2021-12-28 NOTE — Patient Instructions (Signed)
See me in 5-6 weeks ?Your husband is amazing ?

## 2021-12-28 NOTE — Assessment & Plan Note (Signed)
Patient does have scoliosis noted.  Patient is doing relatively well but has not been quite as active as usual with patient helping with her husband and his recent surgery.  Discussed with patient to continue to take time for herself.  Responding well to osteopathic manipulation.  Has a muscle relaxer if she needs it. F/u 6 weeks  ?

## 2022-01-17 ENCOUNTER — Encounter: Payer: Self-pay | Admitting: Physician Assistant

## 2022-01-17 ENCOUNTER — Ambulatory Visit (INDEPENDENT_AMBULATORY_CARE_PROVIDER_SITE_OTHER): Payer: No Typology Code available for payment source | Admitting: Physician Assistant

## 2022-01-17 ENCOUNTER — Other Ambulatory Visit: Payer: Self-pay

## 2022-01-17 ENCOUNTER — Encounter: Payer: Self-pay | Admitting: Family Medicine

## 2022-01-17 DIAGNOSIS — Z1283 Encounter for screening for malignant neoplasm of skin: Secondary | ICD-10-CM

## 2022-01-17 DIAGNOSIS — Z808 Family history of malignant neoplasm of other organs or systems: Secondary | ICD-10-CM

## 2022-01-17 DIAGNOSIS — D2272 Melanocytic nevi of left lower limb, including hip: Secondary | ICD-10-CM | POA: Diagnosis not present

## 2022-01-17 DIAGNOSIS — D485 Neoplasm of uncertain behavior of skin: Secondary | ICD-10-CM

## 2022-01-17 NOTE — Patient Instructions (Signed)

## 2022-01-17 NOTE — Progress Notes (Signed)
? ?  Follow-Up Visit ?  ?Subjective  ?Madison [name] is a 36 y.o. female who presents for the following: Annual Exam (Patient here today for yearly skin check no new concerns. Personal history of abnormal moles. No personal history of melanoma or non mole skin cancer. Family history of melanoma (paternal grandfather)). ? ? ?The following portions of the chart were reviewed this encounter and updated as appropriate:  Tobacco  Allergies  Meds  Problems  Med Hx  Surg Hx  Fam Hx   ?  ? ?Objective  ?Well appearing patient in no apparent distress; mood and affect are within normal limits. ? ?A full examination was performed including scalp, head, eyes, ears, nose, lips, neck, chest, axillae, abdomen, back, buttocks, bilateral upper extremities, bilateral lower extremities, hands, feet, fingers, toes, fingernails, and toenails. All findings within normal limits unless otherwise noted below. ? ?Left Thigh - Posterior ?Bichromic dark nested macule.  ? ? ? ? ? ? ? ?Assessment & Plan  ?Neoplasm of uncertain behavior of skin ?Left Thigh - Posterior ? ?Skin / nail biopsy ?Type of biopsy: tangential   ?Informed consent: discussed and consent obtained   ?Timeout: patient name, date of birth, surgical site, and procedure verified   ?Procedure prep:  Patient was prepped and draped in usual sterile fashion (Non sterile) ?Prep type:  Chlorhexidine ?Anesthesia: the lesion was anesthetized in a standard fashion   ?Anesthetic:  1% lidocaine w/ epinephrine 1-100,000 local infiltration ?Instrument used: flexible razor blade   ?Hemostasis achieved with: aluminum chloride and electrodesiccation   ?Outcome: patient tolerated procedure well   ?Post-procedure details: sterile dressing applied and wound care instructions given   ?Dressing type: bandage and petrolatum   ? ?Specimen 1 - Surgical pathology ?Differential Diagnosis: R/O Atypia - cautery after biopsy ? ?Check Margins: Yes ? ? ? ?I, ,, PA-C, have reviewed all  documentation's for this visit.  The documentation on 01/17/22 for the exam, diagnosis, procedures and orders are all accurate and complete. ?

## 2022-01-25 ENCOUNTER — Ambulatory Visit
Admission: EM | Admit: 2022-01-25 | Discharge: 2022-01-25 | Disposition: A | Discharge status: Home / Self Care | Payer: No Typology Code available for payment source

## 2022-01-25 DIAGNOSIS — T7840XA Allergy, unspecified, initial encounter: Secondary | ICD-10-CM | POA: Insufficient documentation

## 2022-01-25 DIAGNOSIS — S61211A Laceration without foreign body of left index finger without damage to nail, initial encounter: Secondary | ICD-10-CM | POA: Diagnosis not present

## 2022-01-25 DIAGNOSIS — S61218A Laceration without foreign body of other finger without damage to nail, initial encounter: Secondary | ICD-10-CM | POA: Insufficient documentation

## 2022-01-25 NOTE — ED Provider Notes (Signed)
?[place] ? ? ? ?CSN: [id number] ?Arrival date & time: 01/25/22  8309 ?  ? ?HISTORY  ? ?Chief Complaint  ?Patient presents with  ? Laceration  ? ?HPI ?Madison [name] is a 36 y.o. female. Patient states that last night she was cutting a red onion with a knife that was too large and accidentally cut her left index finger.  Patient states it bled for over an hour after she rinsed it thoroughly with tap water and applied pressure.  Patient states she had a PA neighbor look at it who advised continued pressure.  Patient states after it stopped bleeding, she wrapped it with some sterile gauze and a piece of tape.  Patient felt that it was well enough to take the tape off this morning and while getting ready to go to work she banged it on a piece of furniture causing it to start bleeding again.  Patient states she is here today to make sure she does not need stitches.  Patient reports full range of motion of the tip of her index finger on the left, denies numbness, tingling.  Patient states last Tdap was in 2019. ? ?The history is provided by the patient.  ?Past Medical History:  ?Diagnosis Date  ? Allergy   ? Asthma   ? Hx of migraines 01/012003  ? can tell when they come on, treats with excedrin migraine.  ? Scoliosis 10/24/1995  ? Vitamin D deficiency   ? ?Patient Active Problem List  ? Diagnosis Date Noted  ? Carpal tunnel syndrome on right 11/07/2021  ? 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 Chronic urticaria 05/11/2021  ? Other adverse food reactions, not elsewhere classified, subsequent encounter 05/11/2021  ? Seasonal and perennial allergic rhinitis 05/11/2021  ? Mild intermittent asthma without complication 40/76/8088  ? Trigger point of right shoulder region 04/07/2020  ? Nonallopathic lesion of lumbosacral region 11/07/2018  ? Nonallopathic lesion of sacral region 11/07/2018  ? Nonallopathic lesion of thoracic region 11/07/2018  ? 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 Volar  plate injury of finger 01/22/2018  ? Scoliosis 08/21/2017  ? Seasonal allergies 02/15/2017  ? ?Past Surgical History:  ?Procedure Laterality Date  ? partial spinal fusion C7-T2 due to scoliosis 03/1998 N/A 04/11/1998  ? TONSILLECTOMY    ? WISDOM TOOTH EXTRACTION    ? ?OB History   ? ? Gravida  ?0  ? Para  ?0  ? Term  ?0  ? Preterm  ?0  ? AB  ?0  ? Living  ?0  ?  ? ? SAB  ?0  ? IAB  ?0  ? Ectopic  ?0  ? Multiple  ?0  ? Live Births  ?   ?   ?  ?  ? ?Home Medications   ? ?Prior to Admission medications   ?Medication Sig Start Date End Date Taking? Authorizing Provider  ?albuterol (VENTOLIN HFA) 108 (90 Base) MCG/ACT inhaler Inhale 2 puffs into the lungs every 4 (four) hours as needed for wheezing or shortness of breath. 05/11/21   Garnet Sierras, DO  ?COVID-19 At Home Antigen Test KIT Use as directed. 07/28/21   Margie Ege, [facility]  ?COVID-19 At Home Antigen Test KIT Use as needed. 11/24/21   Margie Ege, [facility]  ?COVID-19 mRNA bivalent vaccine, Pfizer, (PFIZER COVID-19 VAC BIVALENT) injection Inject into the muscle. 08/26/21   Carlyle Basques, MD  ?fexofenadine (ALLEGRA) 30 MG tablet Take 30 mg by mouth daily.  [provider]  ?fluticasone (FLONASE) 50 MCG/ACT nasal spray Place 2 sprays into both nostrils daily. 06/01/21   Mar Daring, PA-C  ?hydrOXYzine (ATARAX) 10 MG tablet Take 1 tablet (10 mg total) by mouth 3 (three) times daily as needed. 12/05/21   Lyndal Pulley, DO  ?ipratropium (ATROVENT) 0.03 % nasal spray Place 2 sprays into both nostrils every 12 (twelve) hours. 08/23/21   Lynden Oxford Scales, PA-C  ?Multiple Vitamin (MULTIVITAMIN WITH MINERALS) TABS tablet Take 1 tablet by mouth daily.    [provider]  ?norethindrone-ethinyl estradiol (JUNEL 1/20) 1-20 MG-MCG tablet Take 1 tablet by mouth once a day 08/22/21     ?Probiotic Product (PROBIOTIC PO) Take by mouth.    [provider]  ?pseudoephedrine (SUDAFED) 30 MG tablet Take 2 tablets (60 mg total) by mouth every 8 (eight)  hours as needed for congestion. 08/23/21   Lynden Oxford Scales, PA-C  ?Spacer/Aero-Holding Chambers (AEROCHAMBER PLUS WITH MASK) inhaler Use as directed. 12/07/18   Leath-Warren, Alda Lea, NP  ? ? ?Family History ?Family History  ?Problem Relation Age of Onset  ? Breast cancer Maternal Grandmother 58  ? Osteoporosis Maternal Grandmother   ? Heart failure Maternal Grandmother   ? COPD Maternal Grandmother   ? Healthy Mother   ? Hearing loss Father   ? Other Father   ?     prostate level elevation  ? Diabetes Mellitus II Brother   ?     TWIN  ? Prostate cancer Maternal Grandfather   ?     w mets  ? Heart disease Maternal Grandfather   ? Heart failure Maternal Grandfather   ? Brain cancer Paternal Grandmother   ? Melanoma Paternal Grandfather   ? Colon cancer Paternal Aunt   ? ?Social History ?Social History  ? ?Tobacco Use  ? Smoking status: Never  ? Smokeless tobacco: Never  ?Vaping Use  ? Vaping Use: Never used  ?Substance Use Topics  ? Alcohol use: Yes  ?  Comment: occasionally  ? Drug use: No  ? ?Allergies   ?Kiwi extract, Prednisone, and Meloxicam ? ?Review of Systems ?Review of Systems ?Pertinent findings noted in history of present illness.  ? ?Physical Exam ?Triage Vital Signs ?ED Triage Vitals  ?Enc Vitals Group  ?   BP 08/19/21 0827 (!) 147/82  ?   Pulse Rate 08/19/21 0827 72  ?   Resp 08/19/21 0827 18  ?   Temp 08/19/21 0827 98.3 ?F (36.8 ?C)  ?   Temp Source 08/19/21 0827 Oral  ?   SpO2 08/19/21 0827 98 %  ?   Weight --   ?   Height --   ?   Head Circumference --   ?   Peak Flow --   ?   Pain Score 08/19/21 0826 5  ?   Pain Loc --   ?   Pain Edu? --   ?   Excl. in [place]? --   ?No data found. ? ?Updated Vital Signs ?BP 112/77 (BP Location: Right Arm)   Pulse 69   Temp 98 ?F (36.7 ?C) (Oral)   Resp 18   LMP 01/12/2022   SpO2 99%  ? ?Physical Exam ?Vitals and nursing note reviewed.  ?Constitutional:   ?   General: She is not in acute distress. ?   Appearance: Normal appearance. She is not ill-appearing.   ?HENT:  ?   Head: Normocephalic and atraumatic.  ?Eyes:  ?   General: Lids  are normal.     ?   Right eye: No discharge.     ?   Left eye: No discharge.  ?   Extraocular Movements: Extraocular movements intact.  ?   Conjunctiva/sclera: Conjunctivae normal.  ?   Right eye: Right conjunctiva is not injected.  ?   Left eye: Left conjunctiva is not injected.  ?Neck:  ?   Trachea: Trachea and phonation normal.  ?Cardiovascular:  ?   Rate and Rhythm: Normal rate and regular rhythm.  ?   Pulses: Normal pulses.  ?   Heart sounds: Normal heart sounds. No murmur heard. ?  No friction rub. No gallop.  ?Pulmonary:  ?   Effort: Pulmonary effort is normal. No accessory muscle usage, prolonged expiration or respiratory distress.  ?   Breath sounds: Normal breath sounds. No stridor, decreased air movement or transmitted upper airway sounds. No decreased breath sounds, wheezing, rhonchi or rales.  ?Chest:  ?   Chest wall: No tenderness.  ?Musculoskeletal:     ?   General: Signs of injury (Laceration: 0.5 cm on the lateral aspect of left index finger that is well approximated without surrounding erythema, drainage.  Wound is hemostatic.) present. No swelling or tenderness. Normal range of motion.  ?   Cervical back: Normal range of motion and neck supple. Normal range of motion.  ?Lymphadenopathy:  ?   Cervical: No cervical adenopathy.  ?Skin: ?   General: Skin is warm and dry.  ?   Findings: No erythema or rash.  ?Neurological:  ?   General: No focal deficit present.  ?   Mental Status: She is alert and oriented to person, place, and time.  ?   Sensory: No sensory deficit (Patient has normal sensation proximal and distal to wound on left index finger.).  ?Psychiatric:     ?   Mood and Affect: Mood normal.     ?   Behavior: Behavior normal.  ? ? ?Visual Acuity ?Right Eye Distance:   ?Left Eye Distance:   ?Bilateral Distance:   ? ?Right Eye Near:   ?Left Eye Near:    ?Bilateral Near:    ? ?UC Couse / Diagnostics / Procedures:  ?   ?EKG ? ?Radiology ?No results found. ? ?Procedures ?Procedures (including critical care time) ? ?UC Diagnoses / Final Clinical Impressions(s)   ?I have reviewed the triage vital signs and the nursing notes. ? ?P

## 2022-01-25 NOTE — ED Triage Notes (Signed)
Pt states while cutting an object she obtained a laceration to her left index finger yesterday. No active bleeding at this time, edges are approximated.  ?

## 2022-01-25 NOTE — Discharge Instructions (Signed)
Please leave Steri-Strip in place for the next 2 to 3 days.  Please keep the wound covered and dry with dressing changes every 24 hours.  Please monitor the wound for signs of redness, thick yellow drainage.  Please monitor the joint of your left finger for redness and swelling.  Any of these signs would warrant antibiotics which I do not recommend at this time because you appear to be healing well. ? ?Thank you again for visiting urgent care today.  I appreciate the opportunity to participate your care. ?

## 2022-02-03 ENCOUNTER — Encounter: Payer: 59 | Admitting: Family Medicine

## 2022-02-07 ENCOUNTER — Encounter: Payer: Self-pay | Admitting: Internal Medicine

## 2022-02-07 NOTE — Patient Instructions (Addendum)
? ? ? ?Blood work was ordered.   ? ? ? ?Medications changes include :   none ? ? ? ? ?Return in about 1 year (around 02/09/2023) for Physical Exam. ? ? ?Health Maintenance, Female ?Adopting a healthy lifestyle and getting preventive care are important in promoting health and wellness. Ask your health care provider about: ?The right schedule for you to have regular tests and exams. ?Things you can do on your own to prevent diseases and keep yourself healthy. ?What should I know about diet, weight, and exercise? ?Eat a healthy diet ? ?Eat a diet that includes plenty of vegetables, fruits, low-fat dairy products, and lean protein. ?Do not eat a lot of foods that are high in solid fats, added sugars, or sodium. ?Maintain a healthy weight ?Body mass index (BMI) is used to identify weight problems. It estimates body fat based on height and weight. Your health care provider can help determine your BMI and help you achieve or maintain a healthy weight. ?Get regular exercise ?Get regular exercise. This is one of the most important things you can do for your health. Most adults should: ?Exercise for at least 150 minutes each week. The exercise should increase your heart rate and make you sweat (moderate-intensity exercise). ?Do strengthening exercises at least twice a week. This is in addition to the moderate-intensity exercise. ?Spend less time sitting. Even light physical activity can be beneficial. ?Watch cholesterol and blood lipids ?Have your blood tested for lipids and cholesterol at 37 years of age, then have this test every 5 years. ?Have your cholesterol levels checked more often if: ?Your lipid or cholesterol levels are high. ?You are older than 37 years of age. ?You are at high risk for heart disease. ?What should I know about cancer screening? ?Depending on your health history and family history, you may need to have cancer screening at various ages. This may include screening for: ?Breast cancer. ?Cervical  cancer. ?Colorectal cancer. ?Skin cancer. ?Lung cancer. ?What should I know about heart disease, diabetes, and high blood pressure? ?Blood pressure and heart disease ?High blood pressure causes heart disease and increases the risk of stroke. This is more likely to develop in people who have high blood pressure readings or are overweight. ?Have your blood pressure checked: ?Every 3-5 years if you are 23-78 years of age. ?Every year if you are 74 years old or older. ?Diabetes ?Have regular diabetes screenings. This checks your fasting blood sugar level. Have the screening done: ?Once every three years after age 34 if you are at a normal weight and have a low risk for diabetes. ?More often and at a younger age if you are overweight or have a high risk for diabetes. ?What should I know about preventing infection? ?Hepatitis B ?If you have a higher risk for hepatitis B, you should be screened for this virus. Talk with your health care provider to find out if you are at risk for hepatitis B infection. ?Hepatitis C ?Testing is recommended for: ?Everyone born from [birth year] through 1966. ?Anyone with known risk factors for hepatitis C. ?Sexually transmitted infections (STIs) ?Get screened for STIs, including gonorrhea and chlamydia, if: ?You are sexually active and are younger than 36 years of age. ?You are older than 37 years of age and your health care provider tells you that you are at risk for this type of infection. ?Your sexual activity has changed since you were last screened, and you are at increased risk for chlamydia or gonorrhea. Ask your  health care provider if you are at risk. ?Ask your health care provider about whether you are at high risk for HIV. Your health care provider may recommend a prescription medicine to help prevent HIV infection. If you choose to take medicine to prevent HIV, you should first get tested for HIV. You should then be tested every 3 months for as long as you are taking the  medicine. ?Pregnancy ?If you are about to stop having your period (premenopausal) and you may become pregnant, seek counseling before you get pregnant. ?Take 400 to 800 micrograms (mcg) of folic acid every day if you become pregnant. ?Ask for birth control (contraception) if you want to prevent pregnancy. ?Osteoporosis and menopause ?Osteoporosis is a disease in which the bones lose minerals and strength with aging. This can result in bone fractures. If you are 58 years old or older, or if you are at risk for osteoporosis and fractures, ask your health care provider if you should: ?Be screened for bone loss. ?Take a calcium or vitamin D supplement to lower your risk of fractures. ?Be given hormone replacement therapy (HRT) to treat symptoms of menopause. ?Follow these instructions at home: ?Alcohol use ?Do not drink alcohol if: ?Your health care provider tells you not to drink. ?You are pregnant, may be pregnant, or are planning to become pregnant. ?If you drink alcohol: ?Limit how much you have to: ?0-1 drink a day. ?Know how much alcohol is in your drink. In the [place] one drink equals one 12 oz bottle of beer (355 mL), one 5 oz glass of wine (148 mL), or one 1? oz glass of hard liquor (44 mL). ?Lifestyle ?Do not use any products that contain nicotine or tobacco. These products include cigarettes, chewing tobacco, and vaping devices, such as e-cigarettes. If you need help quitting, ask your health care provider. ?Do not use street drugs. ?Do not share needles. ?Ask your health care provider for help if you need support or information about quitting drugs. ?General instructions ?Schedule regular health, dental, and eye exams. ?Stay current with your vaccines. ?Tell your health care provider if: ?You often feel depressed. ?You have ever been abused or do not feel safe at home. ?Summary ?Adopting a healthy lifestyle and getting preventive care are important in promoting health and wellness. ?Follow your health care  provider's instructions about healthy diet, exercising, and getting tested or screened for diseases. ?Follow your health care provider's instructions on monitoring your cholesterol and blood pressure. ?This information is not intended to replace advice given to you by your health care provider. Make sure you discuss any questions you have with your health care provider. ?Document Revised: 02/28/2021 Document Reviewed: 02/28/2021 ?Elsevier Patient Education ? [place]. ? ?

## 2022-02-07 NOTE — Progress Notes (Signed)
? ? ?Subjective:  ? ? Patient ID: Madison [name], female    DOB: [birth year], 36 y.o.   MRN: [id number] ? ? ?This visit occurred during the SARS-CoV-2 public health emergency.  Safety protocols were in place, including screening questions prior to the visit, additional usage of staff PPE, and extensive cleaning of exam room while observing appropriate contact time as indicated for disinfecting solutions. ? ? ? ?HPI ?Gaylon is here for  ?Chief Complaint  ?Patient presents with  ? Annual Exam  ? ? ? ?Chronic hives - flares for weeks - can do benadryl pills or cream  ?5-6 episodes in past 9 months ?Has seen allergist ? ? ?Medications and allergies reviewed with patient and updated if appropriate. ? ? ? ?Current Outpatient Medications on File Prior to Visit  ?Medication Sig Dispense Refill  ? albuterol (VENTOLIN HFA) 108 (90 Base) MCG/ACT inhaler Inhale 2 puffs into the lungs every 4 (four) hours as needed for wheezing or shortness of breath. 8.5 g 2  ? fexofenadine (ALLEGRA) 30 MG tablet Take 30 mg by mouth daily.     ? hydrOXYzine (ATARAX) 10 MG tablet Take 1 tablet (10 mg total) by mouth 3 (three) times daily as needed. 30 tablet 0  ? Probiotic Product (PROBIOTIC PO) Take by mouth.    ? pseudoephedrine (SUDAFED) 30 MG tablet Take 2 tablets (60 mg total) by mouth every 8 (eight) hours as needed for congestion. 24 tablet 0  ? Spacer/Aero-Holding Chambers (AEROCHAMBER PLUS WITH MASK) inhaler Use as directed. 1 each 0  ? ?No current facility-administered medications on file prior to visit.  ? ? ?Review of Systems  ?Constitutional:  Negative for fever.  ?Eyes:  Negative for visual disturbance.  ?Respiratory:  Negative for cough, shortness of breath and wheezing.   ?Cardiovascular:  Negative for chest pain, palpitations and leg swelling.  ?Gastrointestinal:  Positive for nausea (rare, random). Negative for abdominal pain, blood in stool, constipation and diarrhea.  ?Genitourinary:  Negative for dysuria.   ?Musculoskeletal:  Positive for back pain (intermittent, could be random or related to sitting too long). Negative for arthralgias.  ?Skin:  Negative for rash.  ?Neurological:  Positive for headaches (allergy related). Negative for light-headedness.  ?Psychiatric/Behavioral:  Negative for dysphoric mood. The patient is nervous/anxious.   ? ?   ?Objective:  ? ?Vitals:  ? 02/08/22 1102  ?BP: 100/70  ?Pulse: 84  ?Temp: 98 ?F (36.7 ?C)  ?SpO2: 99%  ? ?Filed Weights  ? 02/08/22 1102  ?Weight: 139 lb (63 kg)  ? ?Body mass index is 24.62 kg/m?. ? ?BP Readings from Last 3 Encounters:  ?02/08/22 100/70  ?01/25/22 112/77  ?12/28/21 98/62  ? ? ?Wt Readings from Last 3 Encounters:  ?02/08/22 139 lb (63 kg)  ?12/28/21 140 lb (63.5 kg)  ?12/05/21 140 lb (63.5 kg)  ? ? ? ?  02/02/2020  ?  7:56 AM 07/09/2019  ?  1:34 PM  ?Depression screen PHQ 2/9  ?Decreased Interest 0 0  ?Down, Depressed, Hopeless 0 0  ?PHQ - 2 Score 0 0  ? ? ? ?   ? View : No data to display.  ?  ?  ?  ? ? ? ? ?  ?Physical Exam ?Constitutional: She appears well-developed and well-nourished. No distress.  ?HENT:  ?Head: Normocephalic and atraumatic.  ?Right Ear: External ear normal. Normal ear canal and TM ?Left Ear: External ear normal.  Normal ear canal and TM ?Mouth/Throat: Oropharynx is clear and moist.  ?Eyes:  Conjunctivae and EOM are normal.  ?Neck: Neck supple. No tracheal deviation present. No thyromegaly present.  ?No carotid bruit  ?Cardiovascular: Normal rate, regular rhythm and normal heart sounds.   ?No murmur heard.  No edema. ?Pulmonary/Chest: Effort normal and breath sounds normal. No respiratory distress. She has no wheezes. She has no rales.  ?Breast: deferred   ?Abdominal: Soft. She exhibits no distension. There is no tenderness.  ?Lymphadenopathy: She has no cervical adenopathy.  ?Skin: Skin is warm and dry. She is not diaphoretic.  ?Psychiatric: She has a normal mood and affect. Her behavior is normal.  ? ? ? ?Lab Results  ?Component Value  Date  ? WBC 9.1 02/02/2021  ? HGB 13.6 02/02/2021  ? HCT 40.8 02/02/2021  ? PLT 265.0 02/02/2021  ? GLUCOSE 65 (L) 02/02/2021  ? CHOL 181 02/02/2021  ? TRIG 82.0 02/02/2021  ? HDL 55.10 02/02/2021  ? LDLCALC 110 (H) 02/02/2021  ? ALT 14 02/02/2021  ? AST 15 02/02/2021  ? NA 139 02/02/2021  ? K 4.4 02/02/2021  ? CL 102 02/02/2021  ? CREATININE 0.67 02/02/2021  ? BUN 10 02/02/2021  ? CO2 29 02/02/2021  ? TSH 0.800 05/11/2021  ? HGBA1C 4.7 02/02/2020  ? ? ? ? ?   ?Assessment & Plan:  ? ?Physical exam: ?Screening blood work  ordered ?Exercise   runs, pilates ?Weight  normal ?Substance abuse  none ? ? ?Reviewed recommended immunizations. ? ? ?Health Maintenance  ?Topic Date Due  ? Hepatitis C Screening  Never done  ? INFLUENZA VACCINE  05/23/2022  ? PAP SMEAR-Modifier  12/18/2023  ? TETANUS/TDAP  10/27/2027  ? COVID-19 Vaccine  Completed  ? HIV Screening  Completed  ? HPV VACCINES  Aged Out  ?  ? ? ? ? ? ? ?See Problem List for Assessment and Plan of chronic medical problems. ? ? ? ? ?

## 2022-02-08 ENCOUNTER — Ambulatory Visit (INDEPENDENT_AMBULATORY_CARE_PROVIDER_SITE_OTHER): Payer: No Typology Code available for payment source | Admitting: Internal Medicine

## 2022-02-08 ENCOUNTER — Encounter: Payer: Self-pay | Admitting: Internal Medicine

## 2022-02-08 VITALS — BP 100/70 | HR 84 | Temp 98.0°F | Ht 63.0 in | Wt 139.0 lb

## 2022-02-08 DIAGNOSIS — Z0000 Encounter for general adult medical examination without abnormal findings: Secondary | ICD-10-CM

## 2022-02-08 DIAGNOSIS — J3089 Other allergic rhinitis: Secondary | ICD-10-CM

## 2022-02-08 DIAGNOSIS — J4520 Mild intermittent asthma, uncomplicated: Secondary | ICD-10-CM

## 2022-02-08 DIAGNOSIS — L508 Other urticaria: Secondary | ICD-10-CM | POA: Diagnosis not present

## 2022-02-08 DIAGNOSIS — E538 Deficiency of other specified B group vitamins: Secondary | ICD-10-CM | POA: Insufficient documentation

## 2022-02-08 DIAGNOSIS — E559 Vitamin D deficiency, unspecified: Secondary | ICD-10-CM

## 2022-02-08 DIAGNOSIS — J302 Other seasonal allergic rhinitis: Secondary | ICD-10-CM

## 2022-02-08 NOTE — Assessment & Plan Note (Addendum)
Chronic ?Year round ?Taking allegra daily ?

## 2022-02-08 NOTE — Progress Notes (Signed)
?Charlann Boxer D.O. ?[facility] ?[place] ?Phone: [phone] ?Subjective:   ?I, Madison [name], am serving as a scribe for Dr. Hulan Saas. ? ?This visit occurred during the SARS-CoV-2 public health emergency.  Safety protocols were in place, including screening questions prior to the visit, additional usage of staff PPE, and extensive cleaning of exam room while observing appropriate contact time as indicated for disinfecting solutions.  ? ?I'm seeing this patient by the request  of:  [name], MD ? ?CC: Low back pain follow-up ? ?[id number]  ?Madison [name] is a 36 y.o. female coming in with complaint of back and neck pain. OMT 12/28/2021. Patient states that her lumbar spine has been catching since last week. Same symptoms though that she typically experiences.  ? ?Medications patient has been prescribed: Hydroxizine ? ?Taking: ? ? ?  ? ? ? ? ?Reviewed prior external information including notes and imaging from previsou exam, outside providers and external EMR if available.  ? ?As well as notes that were available from care everywhere and other healthcare systems. ? ?Past medical history, social, surgical and family history all reviewed in electronic medical record.  No pertanent information unless stated regarding to the chief complaint.  ? ?Past Medical History:  ?Diagnosis Date  ? Allergy   ? Asthma   ? Hx of migraines 01/012003  ? can tell when they come on, treats with excedrin migraine.  ? Scoliosis 10/24/1995  ? Vitamin D deficiency   ?  ?Allergies  ?Allergen Reactions  ? Kiwi Extract Anaphylaxis  ? Prednisone   ?  Tingling in hands and face  ? Meloxicam   ?  heartburn  ? ? ? ?Review of Systems: ? No headache, visual changes, nausea, vomiting, diarrhea, constipation, dizziness, abdominal pain, skin rash, fevers, chills, night sweats, weight loss, swollen lymph nodes, body aches, joint swelling, chest pain, shortness of breath, mood changes. POSITIVE  muscle aches ? ?Objective  ?Blood pressure 92/62, pulse 82, height 5' 3"  (1.6 m), weight 135 lb (61.2 kg), last menstrual period 01/12/2022, SpO2 99 %. ?  ?General: No apparent distress alert and oriented x3 mood and affect normal, dressed appropriately.  ?HEENT: Pupils equal, extraocular movements intact  ?Respiratory: Patient's speak in full sentences and does not appear short of breath  ?Cardiovascular: No lower extremity edema, non tender, no erythema  ?Low back exam does have significant scoliosis still noted. ?Some mild tightness noted in the paraspinal musculature.  No numbness noted in the lower extremities.  4+ out of 5 strength of the lower extremities ? ?Osteopathic findings ? ?C2 flexed rotated and side bent right ?C6 flexed rotated and side bent left ?T3 extended rotated and side bent right inhaled rib ?T9 extended rotated and side bent left ?L2 flexed rotated and side bent right ?Sacrum right on right ? ? ? ? ?  ?Assessment and Plan: ? ?Scoliosis ?Chronic problem and doing relatively well overall.  Discussed icing regimen and home exercises, continue to stay active.  Discussed posture and ergonomics.  Responding well to manipulation.  Patient is starting to become more active again after dealing with her husband's ailments.  Follow-up with me again in 6 to 8 weeks  ? ?Nonallopathic problems ? ?Decision today to treat with OMT was based on Physical Exam ? ?After verbal consent patient was treated with HVLA, ME, FPR techniques in cervical, rib, thoracic, lumbar, and sacral  areas ? ?Patient tolerated the procedure well with improvement in symptoms ? ?  Patient given exercises, stretches and lifestyle modifications ? ?See medications in patient instructions if given ? ?Patient will follow up in 4-8 weeks ? ?  ? ? ?The above documentation has been reviewed and is accurate and complete Lyndal Pulley, DO ? ? ? ?  ? ? Note: This dictation was prepared with Dragon dictation along with smaller phrase  technology. Any transcriptional errors that result from this process are unintentional.    ?  ?  ? ?

## 2022-02-08 NOTE — Assessment & Plan Note (Signed)
Chronic ?Ck B12 level ?

## 2022-02-08 NOTE — Assessment & Plan Note (Signed)
Chronic ?Intermittent hives - can be severe at times ?No obvious cause ?Episodes can last hours ?Take benadryl prn, but would prefer not to take any medication so tries to avoid it unless severe ?Has seen allergist ?

## 2022-02-08 NOTE — Assessment & Plan Note (Signed)
Chronic ?Mild, intermittent ?Allergies are main trigger ?Albuterol inhaler prn but rarely uses it ?

## 2022-02-08 NOTE — Assessment & Plan Note (Signed)
Chronic ?Ck D level ?

## 2022-02-10 ENCOUNTER — Encounter: Payer: No Typology Code available for payment source | Admitting: Family Medicine

## 2022-02-10 ENCOUNTER — Ambulatory Visit (INDEPENDENT_AMBULATORY_CARE_PROVIDER_SITE_OTHER): Payer: No Typology Code available for payment source | Admitting: Family Medicine

## 2022-02-10 VITALS — BP 92/62 | HR 82 | Ht 63.0 in | Wt 135.0 lb

## 2022-02-10 DIAGNOSIS — M9904 Segmental and somatic dysfunction of sacral region: Secondary | ICD-10-CM | POA: Diagnosis not present

## 2022-02-10 DIAGNOSIS — M9901 Segmental and somatic dysfunction of cervical region: Secondary | ICD-10-CM

## 2022-02-10 DIAGNOSIS — M41125 Adolescent idiopathic scoliosis, thoracolumbar region: Secondary | ICD-10-CM

## 2022-02-10 DIAGNOSIS — M9903 Segmental and somatic dysfunction of lumbar region: Secondary | ICD-10-CM

## 2022-02-10 DIAGNOSIS — Z0000 Encounter for general adult medical examination without abnormal findings: Secondary | ICD-10-CM

## 2022-02-10 DIAGNOSIS — E559 Vitamin D deficiency, unspecified: Secondary | ICD-10-CM | POA: Diagnosis not present

## 2022-02-10 DIAGNOSIS — M9902 Segmental and somatic dysfunction of thoracic region: Secondary | ICD-10-CM

## 2022-02-10 DIAGNOSIS — M9908 Segmental and somatic dysfunction of rib cage: Secondary | ICD-10-CM

## 2022-02-10 DIAGNOSIS — E538 Deficiency of other specified B group vitamins: Secondary | ICD-10-CM | POA: Diagnosis not present

## 2022-02-10 LAB — CBC WITH DIFFERENTIAL/PLATELET
Basophils Absolute: 0 10*3/uL (ref 0.0–0.1)
Basophils Relative: 0.5 % (ref 0.0–3.0)
Eosinophils Absolute: 0.3 10*3/uL (ref 0.0–0.7)
Eosinophils Relative: 5 % (ref 0.0–5.0)
HCT: 41.3 % (ref 36.0–46.0)
Hemoglobin: 13.7 g/dL (ref 12.0–15.0)
Lymphocytes Relative: 28.5 % (ref 12.0–46.0)
Lymphs Abs: 2 10*3/uL (ref 0.7–4.0)
MCHC: 33.2 g/dL (ref 30.0–36.0)
MCV: 93.3 fl (ref 78.0–100.0)
Monocytes Absolute: 0.6 10*3/uL (ref 0.1–1.0)
Monocytes Relative: 9 % (ref 3.0–12.0)
Neutro Abs: 3.9 10*3/uL (ref 1.4–7.7)
Neutrophils Relative %: 57 % (ref 43.0–77.0)
Platelets: 259 10*3/uL (ref 150.0–400.0)
RBC: 4.43 Mil/uL (ref 3.87–5.11)
RDW: 13.3 % (ref 11.5–15.5)
WBC: 6.9 10*3/uL (ref 4.0–10.5)

## 2022-02-10 LAB — COMPREHENSIVE METABOLIC PANEL
ALT: 18 U/L (ref 0–35)
AST: 18 U/L (ref 0–37)
Albumin: 4.3 g/dL (ref 3.5–5.2)
Alkaline Phosphatase: 37 U/L — ABNORMAL LOW (ref 39–117)
BUN: 12 mg/dL (ref 6–23)
CO2: 26 mEq/L (ref 19–32)
Calcium: 9.6 mg/dL (ref 8.4–10.5)
Chloride: 103 mEq/L (ref 96–112)
Creatinine, Ser: 0.68 mg/dL (ref 0.40–1.20)
GFR: 111.59 mL/min (ref >60.00)
Glucose, Bld: 80 mg/dL (ref 70–99)
Potassium: 4.3 mEq/L (ref 3.5–5.1)
Sodium: 138 mEq/L (ref 135–145)
Total Bilirubin: 0.3 mg/dL (ref 0.2–1.2)
Total Protein: 7 g/dL (ref 6.0–8.3)

## 2022-02-10 LAB — LIPID PANEL
Cholesterol: 206 mg/dL — ABNORMAL HIGH (ref 0–200)
HDL: 54.4 mg/dL (ref >39.00)
LDL Cholesterol: 134 mg/dL — ABNORMAL HIGH (ref 0–99)
NonHDL: 151.5
Total CHOL/HDL Ratio: 4
Triglycerides: 89 mg/dL (ref 0.0–149.0)
VLDL: 17.8 mg/dL (ref 0.0–40.0)

## 2022-02-10 LAB — VITAMIN D 25 HYDROXY (VIT D DEFICIENCY, FRACTURES): VITD: 26.44 ng/mL — ABNORMAL LOW (ref 30.00–100.00)

## 2022-02-10 LAB — TSH: TSH: 0.72 u[IU]/mL (ref 0.35–5.50)

## 2022-02-10 LAB — VITAMIN B12: Vitamin B-12: 274 pg/mL (ref 211–911)

## 2022-02-10 NOTE — Assessment & Plan Note (Signed)
Chronic problem and doing relatively well overall.  Discussed icing regimen and home exercises, continue to stay active.  Discussed posture and ergonomics.  Responding well to manipulation.  Patient is starting to become more active again after dealing with her husband's ailments.  Follow-up with me again in 6 to 8 weeks ?

## 2022-02-10 NOTE — Patient Instructions (Signed)
Happy bday! Get yourself something nice  ?Stay active ?See me again in 5-6 weeks  ?

## 2022-03-22 NOTE — Progress Notes (Unsigned)
  [place] Phone: [phone] Subjective:   Madison [name], am serving as a scribe for Dr. Hulan [name].   I'm seeing this patient by the request  of:  Madison Rail, MD  CC: Back and neck pain follow-up  [id number]  Madison [name] is a 36 y.o. female coming in with complaint of back and neck pain. OMT 02/10/2022. Patient states that she is doing well.  Patient has been able to even start playing pickle ball.  Not running quite as much but feels like she is doing well overall.  Medications patient has been prescribed: Atarax  Taking:          Past Medical History:  Diagnosis Date   Allergy    Asthma    Hx of migraines 01/012003   can tell when they come on, treats with excedrin migraine.   Scoliosis 10/24/1995   Vitamin D deficiency     Allergies  Allergen Reactions   Kiwi Extract Anaphylaxis   Prednisone     Tingling in hands and face   Meloxicam     heartburn     Review of Systems:  [name] headache, visual changes, nausea, vomiting, diarrhea, constipation, dizziness, abdominal pain, skin rash, fevers, chills, night sweats, weight loss, swollen lymph nodes, body aches, joint swelling, chest pain, shortness of breath, mood changes. POSITIVE muscle aches  Objective  Blood pressure 110/78, pulse 80, height 5' 3"  (1.6 m), weight 142 lb (64.4 kg), SpO2 98 %.   General: [name] apparent distress alert and oriented x3 mood and affect normal, dressed appropriately.  HEENT: Pupils equal, extraocular movements intact  Respiratory: Patient's speak in full sentences and does not appear short of breath  Cardiovascular: [name] lower extremity edema, non tender, [name] erythema  Patient does have scoliosis still noted.  Patient is minorly tight in the paraspinal musculature in the lumbar spine on the right side.  Patient also has some tightness noted on the left side of the thoracic area.  Osteopathic  findings  T3 extended rotated and side bent left inhaled rib T9 extended rotated and side bent left L2-5 neutral rotated left and side bent right Sacrum right on right       Assessment and Plan:  Scoliosis Patient overall is doing a little bit better at the moment.  Discussed with patient medicine regimen and home exercises, discussed which activities to do and which ones to avoid, increase activity slowly.  Follow-up with me again in 6 to 8 weeks.    Nonallopathic problems  Decision today to treat with OMT was based on Physical Exam  After verbal consent patient was treated with  ME, FPR techniques in , rib, thoracic, lumbar, and sacral  areas  Patient tolerated the procedure well with improvement in symptoms  Patient given exercises, stretches and lifestyle modifications  See medications in patient instructions if given  Patient will follow up in 4-8 weeks      The above documentation has been reviewed and is accurate and complete Madison Pulley, DO        Note: This dictation was prepared with Dragon dictation along with smaller phrase technology. Any transcriptional errors that result from this process are unintentional.

## 2022-03-24 ENCOUNTER — Ambulatory Visit (INDEPENDENT_AMBULATORY_CARE_PROVIDER_SITE_OTHER): Payer: No Typology Code available for payment source | Admitting: Family Medicine

## 2022-03-24 ENCOUNTER — Encounter: Payer: Self-pay | Admitting: Family Medicine

## 2022-03-24 VITALS — BP 110/78 | HR 80 | Ht 63.0 in | Wt 142.0 lb

## 2022-03-24 DIAGNOSIS — M9902 Segmental and somatic dysfunction of thoracic region: Secondary | ICD-10-CM | POA: Diagnosis not present

## 2022-03-24 DIAGNOSIS — M9904 Segmental and somatic dysfunction of sacral region: Secondary | ICD-10-CM | POA: Diagnosis not present

## 2022-03-24 DIAGNOSIS — M9903 Segmental and somatic dysfunction of lumbar region: Secondary | ICD-10-CM | POA: Diagnosis not present

## 2022-03-24 DIAGNOSIS — M41125 Adolescent idiopathic scoliosis, thoracolumbar region: Secondary | ICD-10-CM | POA: Diagnosis not present

## 2022-03-24 NOTE — Assessment & Plan Note (Signed)
Patient overall is doing a little bit better at the moment.  Discussed with patient medicine regimen and home exercises, discussed which activities to do and which ones to avoid, increase activity slowly.  Follow-up with me again in 6 to 8 weeks.

## 2022-03-24 NOTE — Patient Instructions (Signed)
Great to see you Keep beating up Madison [name] on [place] See me in 2-3 months

## 2022-04-19 ENCOUNTER — Other Ambulatory Visit (HOSPITAL_BASED_OUTPATIENT_CLINIC_OR_DEPARTMENT_OTHER): Payer: Self-pay

## 2022-04-19 MED ORDER — MUPIROCIN 2 % EX OINT
TOPICAL_OINTMENT | CUTANEOUS | 0 refills | Status: DC
  Filled 2022-04-19: qty 66, 14d supply, fill #0

## 2022-06-12 ENCOUNTER — Other Ambulatory Visit (HOSPITAL_BASED_OUTPATIENT_CLINIC_OR_DEPARTMENT_OTHER): Payer: Self-pay

## 2022-06-12 ENCOUNTER — Telehealth: Payer: No Typology Code available for payment source | Admitting: Emergency Medicine

## 2022-06-12 DIAGNOSIS — J329 Chronic sinusitis, unspecified: Secondary | ICD-10-CM | POA: Diagnosis not present

## 2022-06-12 MED ORDER — AMOXICILLIN-POT CLAVULANATE 875-125 MG PO TABS
1.0000 | ORAL_TABLET | Freq: Two times a day (BID) | ORAL | 0 refills | Status: DC
  Filled 2022-06-12: qty 14, 7d supply, fill #0

## 2022-06-12 NOTE — Progress Notes (Signed)
E-Visit for Sinus Problems  We are sorry that you are not feeling well.  Here is how we plan to help!  Based on what you have shared with me it looks like you have sinusitis.  Sinusitis is inflammation and infection in the sinus cavities of the head.  Based on your presentation I believe you most likely have Acute Bacterial Sinusitis.  This is an infection caused by bacteria and is treated with antibiotics. I have prescribed Augmentin 874m/125mg one tablet twice daily with food, for 7 days. You may use an oral decongestant such as Mucinex D or if you have glaucoma or high blood pressure use plain Mucinex. Saline nasal spray help and can safely be used as often as needed for congestion.  If you develop worsening sinus pain, fever or notice severe headache and vision changes, or if symptoms are not better after completion of antibiotic, please schedule an appointment with a health care provider.    Sinus infections are not as easily transmitted as other respiratory infection, however we still recommend that you avoid close contact with loved ones, especially the very young and elderly.  Remember to wash your hands thoroughly throughout the day as this is the number one way to prevent the spread of infection!  Home Care: Only take medications as instructed by your medical team. Complete the entire course of an antibiotic. Do not take these medications with alcohol. A steam or ultrasonic humidifier can help congestion.  You can place a towel over your head and breathe in the steam from hot water coming from a faucet. Avoid close contacts especially the very young and the elderly. Cover your mouth when you cough or sneeze. Always remember to wash your hands.  Get Help Right Away If: You develop worsening fever or sinus pain. You develop a severe head ache or visual changes. Your symptoms persist after you have completed your treatment plan.  Make sure you Understand these instructions. Will watch  your condition. Will get help right away if you are not doing well or get worse.  Thank you for choosing an e-visit.  Your e-visit answers were reviewed by a board certified advanced clinical practitioner to complete your personal care plan. Depending upon the condition, your plan could have included both over the counter or prescription medications.  Please review your pharmacy choice. Make sure the pharmacy is open so you can pick up prescription now. If there is a problem, you may contact your provider through [employer] have the prescription routed to another pharmacy.  Your safety is important to [place] If you have drug allergies check your prescription carefully.   For the next 24 hours you can use MyChart to ask questions about today's visit, request a non-urgent call back, or ask for a work or school excuse. You will get an email in the next two days asking about your experience. I hope that your e-visit has been valuable and will speed your recovery.  Approximately 5 minutes was used in reviewing the patient's chart, questionnaire, prescribing medications, and documentation.

## 2022-06-14 NOTE — Addendum Note (Signed)
Addended by: Mar Daring on: 06/14/2022 02:25 PM   Modules accepted: Orders

## 2022-06-19 ENCOUNTER — Other Ambulatory Visit (HOSPITAL_BASED_OUTPATIENT_CLINIC_OR_DEPARTMENT_OTHER): Payer: Self-pay

## 2022-06-19 MED ORDER — NORETHIN ACE-ETH ESTRAD-FE 1-20 MG-MCG PO TABS
ORAL_TABLET | ORAL | 2 refills | Status: DC
  Filled 2022-06-19: qty 84, 84d supply, fill #0
  Filled 2022-09-28: qty 84, 84d supply, fill #1
  Filled 2023-01-10: qty 84, 84d supply, fill #2

## 2022-06-20 NOTE — Progress Notes (Deleted)
  Madison [name] Phone: [phone] Subjective:    I'm seeing this patient by the request  of:  [name], MD  CC:   [id number]  Madison [name] is a 36 y.o. female coming in with complaint of back and neck pain. OMT 6/22023. Patient states   Medications patient has been prescribed: Atarax  Taking:         Reviewed prior external information including notes and imaging from previsou exam, outside providers and external EMR if available.   As well as notes that were available from care everywhere and other healthcare systems.  Past medical history, social, surgical and family history all reviewed in electronic medical record.  No pertanent information unless stated regarding to the chief complaint.   Past Medical History:  Diagnosis Date   Allergy    Asthma    Hx of migraines 01/012003   can tell when they come on, treats with excedrin migraine.   Scoliosis 10/24/1995   Vitamin D deficiency     Allergies  Allergen Reactions   Kiwi Extract Anaphylaxis   Prednisone     Tingling in hands and face   Meloxicam     heartburn     Review of Systems:  No headache, visual changes, nausea, vomiting, diarrhea, constipation, dizziness, abdominal pain, skin rash, fevers, chills, night sweats, weight loss, swollen lymph nodes, body aches, joint swelling, chest pain, shortness of breath, mood changes. POSITIVE muscle aches  Objective  There were no vitals taken for this visit.   General: No apparent distress alert and oriented x3 mood and affect normal, dressed appropriately.  HEENT: Pupils equal, extraocular movements intact  Respiratory: Patient's speak in full sentences and does not appear short of breath  Cardiovascular: No lower extremity edema, non tender, no erythema  Gait MSK:  Back   Osteopathic findings  C2 flexed rotated and side bent right C6 flexed rotated and side bent left T3  extended rotated and side bent right inhaled rib T9 extended rotated and side bent left L2 flexed rotated and side bent right Sacrum right on right       Assessment and Plan:  No problem-specific Assessment & Plan notes found for this encounter.    Nonallopathic problems  Decision today to treat with OMT was based on Physical Exam  After verbal consent patient was treated with HVLA, ME, FPR techniques in cervical, rib, thoracic, lumbar, and sacral  areas  Patient tolerated the procedure well with improvement in symptoms  Patient given exercises, stretches and lifestyle modifications  See medications in patient instructions if given  Patient will follow up in 4-8 weeks             Note: This dictation was prepared with Dragon dictation along with smaller phrase technology. Any transcriptional errors that result from this process are unintentional.

## 2022-06-28 ENCOUNTER — Ambulatory Visit (INDEPENDENT_AMBULATORY_CARE_PROVIDER_SITE_OTHER): Payer: No Typology Code available for payment source | Admitting: Family Medicine

## 2022-06-28 ENCOUNTER — Other Ambulatory Visit (HOSPITAL_BASED_OUTPATIENT_CLINIC_OR_DEPARTMENT_OTHER): Payer: Self-pay

## 2022-06-28 ENCOUNTER — Encounter: Payer: Self-pay | Admitting: Family Medicine

## 2022-06-28 VITALS — BP 112/84 | HR 85 | Ht 63.0 in | Wt 148.0 lb

## 2022-06-28 DIAGNOSIS — M41125 Adolescent idiopathic scoliosis, thoracolumbar region: Secondary | ICD-10-CM | POA: Diagnosis not present

## 2022-06-28 DIAGNOSIS — M9901 Segmental and somatic dysfunction of cervical region: Secondary | ICD-10-CM | POA: Diagnosis not present

## 2022-06-28 DIAGNOSIS — M9902 Segmental and somatic dysfunction of thoracic region: Secondary | ICD-10-CM

## 2022-06-28 DIAGNOSIS — M9903 Segmental and somatic dysfunction of lumbar region: Secondary | ICD-10-CM

## 2022-06-28 MED ORDER — IBUPROFEN 600 MG PO TABS
600.0000 mg | ORAL_TABLET | Freq: Three times a day (TID) | ORAL | 0 refills | Status: DC
  Filled 2022-06-28: qty 90, 30d supply, fill #0

## 2022-06-28 MED ORDER — KETOROLAC TROMETHAMINE 30 MG/ML IJ SOLN
30.0000 mg | Freq: Once | INTRAMUSCULAR | Status: AC
  Administered 2022-06-28: 30 mg via INTRAMUSCULAR

## 2022-06-28 NOTE — Patient Instructions (Signed)
  IBU 644m 3x a day for 3 days if needed [place]

## 2022-06-28 NOTE — Assessment & Plan Note (Signed)
Still having pain discussed HEP, discussed other meds and tried Ibuprofen 600 with patient on her trip  RTC in 6 weeks

## 2022-06-28 NOTE — Progress Notes (Signed)
[name] Phone: [phone] Subjective:   Fontaine No, am serving as a scribe for Dr. Hulan Saas.  I'm seeing this patient by the request  of:  [name], MD  CC: back and neck pain follow up   [id number]  Madison [name] is a 36 y.o. female coming in with complaint of back and neck pain Patient states that for past 3 weeks she has had increasing in R shoulder, scapula, and cervical spine. Slept in a different bed and was lifting some boxes over weekend. Does feel that her second bout of COVID caused a lot of muscle pain. Patient does not like to use muscle relaxer due feeling groggy next morning.   Medications patient has been prescribed:   Taking:         Reviewed prior external information including notes and imaging from previsou exam, outside providers and external EMR if available.   As well as notes that were available from care everywhere and other healthcare systems.  Past medical history, social, surgical and family history all reviewed in electronic medical record.  No pertanent information unless stated regarding to the chief complaint.   Past Medical History:  Diagnosis Date   Allergy    Asthma    Hx of migraines 01/012003   can tell when they come on, treats with excedrin migraine.   Scoliosis 10/24/1995   Vitamin D deficiency     Allergies  Allergen Reactions   Kiwi Extract Anaphylaxis   Prednisone     Tingling in hands and face   Meloxicam     heartburn     Review of Systems:  No headache, visual changes, nausea, vomiting, diarrhea, constipation, dizziness, abdominal pain, skin rash, fevers, chills, night sweats, weight loss, swollen lymph nodes, body aches, joint swelling, chest pain, shortness of breath, mood changes. POSITIVE muscle aches  Objective  Blood pressure 112/84, pulse 85, height 5' 3"  (1.6 m), weight 148 lb (67.1 kg), SpO2 99 %.   General:  No apparent distress alert and oriented x3 mood and affect normal, dressed appropriately.  HEENT: Pupils equal, extraocular movements intact  Respiratory: Patient's speak in full sentences and does not appear short of breath  Cardiovascular: No lower extremity edema, non tender, no erythema  Gait normal  MSK:  Back scoliosis noted, more tightness noted, discussed HEP    Osteopathic findings  T3 extended rotated and side bent right  T9 extended rotated and side bent left L3 flexed rotated and side bent right\ L5 F RS right  Sacrum right on right     Assessment and Plan:  Scoliosis Still having pain discussed HEP, discussed other meds and tried Ibuprofen 600 with patient on her trip  RTC in 6 weeks     Nonallopathic problems  Decision today to treat with OMT was based on Physical Exam  After verbal consent patient was treated with HVLA, ME, FPR techniques in  thoracic, lumbar, and sacral  areas  Patient tolerated the procedure well with improvement in symptoms  Patient given exercises, stretches and lifestyle modifications  See medications in patient instructions if given  Patient will follow up in 4-8 weeks     The above documentation has been reviewed and is accurate and complete Lyndal Pulley, DO         Note: This dictation was prepared with Dragon dictation along with smaller phrase technology. Any transcriptional errors that result from this process  are unintentional.

## 2022-06-30 ENCOUNTER — Ambulatory Visit: Payer: No Typology Code available for payment source | Admitting: Family Medicine

## 2022-07-26 NOTE — Progress Notes (Unsigned)
Madison  [name] [address] [place] Phone: [phone] Subjective:   Madison [name], am serving as a scribe for Dr. Hulan Saas.  I'm seeing this patient by the request  of:  [name], MD  CC: back and neck pain f/u   [id number]  Madison [name] is a 36 y.o. female coming in with complaint of back and neck pain. OMT on 06/28/2022. Patient states back is doing well. Dropped a plantar on her left foot. Just wants you to check it out. No other issues.  Medications patient has been prescribed: Advil  Taking: well          Reviewed prior external information including notes and imaging from previsou exam, outside providers and external EMR if available.   As well as notes that were available from care everywhere and other healthcare systems.  Past medical history, social, surgical and family history all reviewed in electronic medical record.  No pertanent information unless stated regarding to the chief complaint.   Past Medical History:  Diagnosis Date   Allergy    Asthma    Hx of migraines 01/012003   can tell when they come on, treats with excedrin migraine.   Scoliosis 10/24/1995   Vitamin D deficiency     Allergies  Allergen Reactions   Kiwi Extract Anaphylaxis   Prednisone     Tingling in hands and face   Meloxicam     heartburn     Review of Systems:  No headache, visual changes, nausea, vomiting, diarrhea, constipation, dizziness, abdominal pain, skin rash, fevers, chills, night sweats, weight loss, swollen lymph nodes, body aches, joint swelling, chest pain, shortness of breath, mood changes. POSITIVE muscle aches  Objective  Blood pressure 102/66, pulse 77, height 5' 3"  (1.6 m), weight 141 lb (64 kg), SpO2 99 %.   General: No apparent distress alert and oriented x3 mood and affect normal, dressed appropriately.  HEENT: Pupils equal, extraocular movements intact  Respiratory: Patient's speak in  full sentences and does not appear short of breath  Cardiovascular: No lower extremity edema, non tender, no erythema  MSK:  Back does have some scoliosis still noted.  Some tightness noted in the paraspinal musculature of the lumbar sacral area.  Patient does have tightness with Corky Sox right greater than left.  Foot exam shows the patient does have a mild scratch noted.  It did appear that there is some mild dried blood over the lateral aspect of the foot.  Does have what appears to be mild contusion surrounding the area.  No pain over the lateral malleolus.  Limited muscular skeletal ultrasound was performed and interpreted by Hulan Saas, M  Limited ultrasound of patient's left foot shows that there is a hematoma underneath it.  No sign of any infectious etiology. Impression: Foot contusion   Osteopathic findings  C2 flexed rotated and side bent right C5 flexed rotated and side bent left T3 extended rotated and side bent right inhaled rib Large neutral growth curve in the thoracolumbar junction noted. L2 flexed rotated and side bent right Sacrum right on right     Assessment and Plan:  Scoliosis Scoliosis.  Patient did travel and has some mild tightness but nothing severe at this time.  Discussed which activities to do and which ones to avoid.  Has ibuprofen prescriptions for breakthrough.  Follow-up again in 6 to 8 weeks.  Abrasion of left foot Patient does have an abrasion of the left foot.  This was a piece of metal.  Last tetanus was greater than 5 years ago.  This did have cellulitis.  Would consider getting a Tdap in patient is going to check with primary care.    Nonallopathic problems  Decision today to treat with OMT was based on Physical Exam  After verbal consent patient was treated with HVLA, ME, FPR techniques in cervical, rib, thoracic, lumbar, and sacral  areas  Patient tolerated the procedure well with improvement in symptoms  Patient given exercises,  stretches and lifestyle modifications  See medications in patient instructions if given  Patient will follow up in 4-8 weeks    The above documentation has been reviewed and is accurate and complete Lyndal Pulley, DO          Note: This dictation was prepared with Dragon dictation along with smaller phrase technology. Any transcriptional errors that result from this process are unintentional.

## 2022-07-27 ENCOUNTER — Ambulatory Visit (INDEPENDENT_AMBULATORY_CARE_PROVIDER_SITE_OTHER): Payer: No Typology Code available for payment source | Admitting: Family Medicine

## 2022-07-27 ENCOUNTER — Ambulatory Visit: Payer: Self-pay

## 2022-07-27 ENCOUNTER — Ambulatory Visit (INDEPENDENT_AMBULATORY_CARE_PROVIDER_SITE_OTHER): Payer: No Typology Code available for payment source

## 2022-07-27 ENCOUNTER — Encounter: Payer: Self-pay | Admitting: Family Medicine

## 2022-07-27 VITALS — BP 102/66 | HR 77 | Ht 63.0 in | Wt 141.0 lb

## 2022-07-27 DIAGNOSIS — M79672 Pain in left foot: Secondary | ICD-10-CM

## 2022-07-27 DIAGNOSIS — M41125 Adolescent idiopathic scoliosis, thoracolumbar region: Secondary | ICD-10-CM

## 2022-07-27 DIAGNOSIS — M9902 Segmental and somatic dysfunction of thoracic region: Secondary | ICD-10-CM

## 2022-07-27 DIAGNOSIS — S91012A Laceration without foreign body, left ankle, initial encounter: Secondary | ICD-10-CM

## 2022-07-27 DIAGNOSIS — M9901 Segmental and somatic dysfunction of cervical region: Secondary | ICD-10-CM

## 2022-07-27 DIAGNOSIS — W450XXA Nail entering through skin, initial encounter: Secondary | ICD-10-CM

## 2022-07-27 DIAGNOSIS — S90812A Abrasion, left foot, initial encounter: Secondary | ICD-10-CM | POA: Insufficient documentation

## 2022-07-27 DIAGNOSIS — M9903 Segmental and somatic dysfunction of lumbar region: Secondary | ICD-10-CM

## 2022-07-27 DIAGNOSIS — M9908 Segmental and somatic dysfunction of rib cage: Secondary | ICD-10-CM

## 2022-07-27 DIAGNOSIS — M9904 Segmental and somatic dysfunction of sacral region: Secondary | ICD-10-CM

## 2022-07-27 DIAGNOSIS — Z23 Encounter for immunization: Secondary | ICD-10-CM | POA: Diagnosis not present

## 2022-07-27 NOTE — Assessment & Plan Note (Signed)
Scoliosis.  Patient did travel and has some mild tightness but nothing severe at this time.  Discussed which activities to do and which ones to avoid.  Has ibuprofen prescriptions for breakthrough.  Follow-up again in 6 to 8 weeks.

## 2022-07-27 NOTE — Assessment & Plan Note (Signed)
Patient does have an abrasion of the left foot.  This was a piece of metal.  Last tetanus was greater than 5 years ago.  This did have cellulitis.  Would consider getting a Tdap in patient is going to check with primary care.

## 2022-07-27 NOTE — Assessment & Plan Note (Signed)
Patient does have region that did draw some blood.  Was not dirty overall.  Was greater than 4 years ago but less than 5 years ago.

## 2022-08-03 NOTE — Progress Notes (Signed)
Patient comes in today after dropping a metal plantar on her left foot . There was a sharp end of the metal plantar that broke the skin on the left side of her ankle. Tdap was administered as a preventive measure.

## 2022-08-04 ENCOUNTER — Encounter: Payer: Self-pay | Admitting: Internal Medicine

## 2022-08-25 ENCOUNTER — Other Ambulatory Visit (HOSPITAL_BASED_OUTPATIENT_CLINIC_OR_DEPARTMENT_OTHER): Payer: Self-pay

## 2022-08-25 MED ORDER — COMIRNATY 30 MCG/0.3ML IM SUSY
PREFILLED_SYRINGE | INTRAMUSCULAR | 0 refills | Status: DC
  Filled 2022-08-25: qty 0.3, 1d supply, fill #0

## 2022-09-01 ENCOUNTER — Other Ambulatory Visit (HOSPITAL_BASED_OUTPATIENT_CLINIC_OR_DEPARTMENT_OTHER): Payer: Self-pay

## 2022-09-01 ENCOUNTER — Telehealth: Payer: No Typology Code available for payment source | Admitting: Emergency Medicine

## 2022-09-01 DIAGNOSIS — L259 Unspecified contact dermatitis, unspecified cause: Secondary | ICD-10-CM

## 2022-09-01 MED ORDER — TRIAMCINOLONE ACETONIDE 0.1 % EX CREA
1.0000 | TOPICAL_CREAM | Freq: Two times a day (BID) | CUTANEOUS | 0 refills | Status: DC
  Filled 2022-09-01: qty 30, 30d supply, fill #0

## 2022-09-01 NOTE — Progress Notes (Addendum)
E Visit for Rash  We are sorry that you are not feeling well. Here is how we plan to help!  The image of the rash you shared looks like it could either be from a bite or from contact dermatitis, meaning your skin came in contact with something that irritated it. I recommend taking Benadryl 58m for itching. Benadryl can make you sleepy, so it's ok to take it only at night. If you need something for itch during the day, you can take Claritin (or loratadine) 155mdaily OR Zyrtec (or cetirizine) 109maily.   I have prescribed a steroid cream to use on your rash. It does not appear infected so I do not think mupirocin will help. I see you are allergic to prednisone - some people who have trouble with prednisone pills do ok with a steroid cream. If your allergy to prednisone is severe and you cannot use the steroid cream, do not pick up the prescription and use it. Instead, you can try an eczema cream/lotion like Aveeno.   HOME CARE:  Take cool showers and avoid direct sunlight. Apply cool compress or wet dressings. Take a bath in an oatmeal bath.  Sprinkle content of one Aveeno packet under running faucet with comfortably warm water.  Bathe for 15-20 minutes, 1-2 times daily.  Pat dry with a towel. Do not rub the rash. Use hydrocortisone cream. Take an antihistamine like Benadryl for widespread rashes that itch.  The adult dose of Benadryl is 25-50 mg by mouth 4 times daily. Caution:  This type of medication may cause sleepiness.  Do not drink alcohol, drive, or operate dangerous machinery while taking antihistamines.  Do not take these medications if you have prostate enlargement.  Read package instructions thoroughly on all medications that you take.  GET HELP RIGHT AWAY IF:  Symptoms don't go away after treatment. Severe itching that persists. If you rash spreads or swells. If you rash begins to smell. If it blisters and opens or develops a yellow-brown crust. You develop a fever. You have a  sore throat. You become short of breath.  MAKE SURE YOU:  Understand these instructions. Will watch your condition. Will get help right away if you are not doing well or get worse.  Thank you for choosing an e-visit.  Your e-visit answers were reviewed by a board certified advanced clinical practitioner to complete your personal care plan. Depending upon the condition, your plan could have included both over the counter or prescription medications.  Please review your pharmacy choice. Make sure the pharmacy is open so you can pick up prescription now. If there is a problem, you may contact your provider through [employer] have the prescription routed to another pharmacy.  Your safety is important to us.[place] you have drug allergies check your prescription carefully.   For the next 24 hours you can use MyChart to ask questions about today's visit, request a non-urgent call back, or ask for a work or school excuse. You will get an email in the next two days asking about your experience. I hope that your e-visit has been valuable and will speed your recovery.  I have spent 5 minutes in review of e-visit questionnaire, review and updating patient chart, medical decision making and response to patient.   AngWilleen CasshD, FNP-BC

## 2022-09-05 NOTE — Progress Notes (Unsigned)
[facility] [place] Phone: [phone] Subjective:   Madison [name], am serving as a scribe for Dr. Hulan Saas.  I'm seeing this patient by the request  of:  [name], MD  CC: Back and neck pain follow-up  [id number]  Madison [name] is a 36 y.o. female coming in with complaint of back and neck pain. OMT 07/27/2022.  Has had stress recently.  The patient was concerned recently with more stress due to husband having some difficulty with tachycardia.  Has been seen by cardiology and are awaiting more testing.  Patient states that because of this is not taking care of herself as much.  Patientis planning on doing a run here in the next weekend.  Medications patient has been prescribed: Advil  Taking:         Reviewed prior external information including notes and imaging from previsou exam, outside providers and external EMR if available.   As well as notes that were available from care everywhere and other healthcare systems.  Past medical history, social, surgical and family history all reviewed in electronic medical record.  No pertanent information unless stated regarding to the chief complaint.   Past Medical History:  Diagnosis Date   Allergy    Asthma    Hx of migraines 01/012003   can tell when they come on, treats with excedrin migraine.   Scoliosis 10/24/1995   Vitamin D deficiency     Allergies  Allergen Reactions   Kiwi Extract Anaphylaxis   Prednisone     Tingling in hands and face   Meloxicam     heartburn     Review of Systems:  No headache, visual changes, nausea, vomiting, diarrhea, constipation, dizziness, abdominal pain, skin rash, fevers, chills, night sweats, weight loss, swollen lymph nodes, body aches, joint swelling, chest pain, shortness of breath, mood changes. POSITIVE muscle aches  Objective  Blood pressure 100/60, pulse 81, height 5' 3"  (1.6 m), weight 140 lb  (63.5 kg), SpO2 99 %.   General: No apparent distress alert and oriented x3 mood and affect normal, dressed appropriately.  HEENT: Pupils equal, extraocular movements intact  Respiratory: Patient's speak in full sentences and does not appear short of breath  Cardiovascular: No lower extremity edema, non tender, no erythema  Low back exam does have some loss of lordosis.  Some tenderness to palpation noted.  Scoliosis noted that is quite severe.  Tightness noted in the parascapular area right greater than left.  Osteopathic findings  C2 flexed rotated and side bent right C6 flexed rotated and side bent left T3 through T8 neutral rotated left side bent right extended rotated and side bent right inhaled rib at T5 L2 flexed rotated and side bent right Sacrum right on right       Assessment and Plan:  Scoliosis Patient has had the spinal fusion.  Discussed with patient about icing regimen and home exercises.  Has responded extremely well though to osteopathic manipulation.  Trying to avoid to new medications and is taking Advil more on an as-needed basis.  Given some hydroxyzine that has helped her previously with some of the chronic urticaria patient does have a couple areas that do seem to be giving more trouble.  Follow-up with me again in 6 to 8 weeks.  Total time with patient 32 minutes this does not include the procedure of osteopathic manipulation    Nonallopathic problems  Decision today to treat with  OMT was based on Physical Exam  After verbal consent patient was treated with HVLA, ME, FPR techniques in cervical, rib, thoracic, lumbar, and sacral  areas  Patient tolerated the procedure well with improvement in symptoms  Patient given exercises, stretches and lifestyle modifications  See medications in patient instructions if given  Patient will follow up in 4-8 weeks    The above documentation has been reviewed and is accurate and complete Lyndal Pulley, DO           Note: This dictation was prepared with Dragon dictation along with smaller phrase technology. Any transcriptional errors that result from this process are unintentional.

## 2022-09-07 ENCOUNTER — Other Ambulatory Visit (HOSPITAL_BASED_OUTPATIENT_CLINIC_OR_DEPARTMENT_OTHER): Payer: Self-pay

## 2022-09-07 ENCOUNTER — Ambulatory Visit (INDEPENDENT_AMBULATORY_CARE_PROVIDER_SITE_OTHER): Payer: No Typology Code available for payment source | Admitting: Family Medicine

## 2022-09-07 ENCOUNTER — Encounter (HOSPITAL_BASED_OUTPATIENT_CLINIC_OR_DEPARTMENT_OTHER): Payer: Self-pay | Admitting: Pharmacist

## 2022-09-07 VITALS — BP 100/60 | HR 81 | Ht 63.0 in | Wt 140.0 lb

## 2022-09-07 DIAGNOSIS — M9903 Segmental and somatic dysfunction of lumbar region: Secondary | ICD-10-CM | POA: Diagnosis not present

## 2022-09-07 DIAGNOSIS — M9908 Segmental and somatic dysfunction of rib cage: Secondary | ICD-10-CM

## 2022-09-07 DIAGNOSIS — M9901 Segmental and somatic dysfunction of cervical region: Secondary | ICD-10-CM | POA: Diagnosis not present

## 2022-09-07 DIAGNOSIS — M9902 Segmental and somatic dysfunction of thoracic region: Secondary | ICD-10-CM

## 2022-09-07 DIAGNOSIS — M9904 Segmental and somatic dysfunction of sacral region: Secondary | ICD-10-CM

## 2022-09-07 DIAGNOSIS — M41125 Adolescent idiopathic scoliosis, thoracolumbar region: Secondary | ICD-10-CM

## 2022-09-07 MED ORDER — HYDROXYZINE HCL 10 MG PO TABS
10.0000 mg | ORAL_TABLET | Freq: Three times a day (TID) | ORAL | 0 refills | Status: DC | PRN
  Filled 2022-09-07: qty 270, 90d supply, fill #0

## 2022-09-07 NOTE — Patient Instructions (Addendum)
Good to see you  Stay active Refill of hydroxyzine Follow up in  6-8 weeks

## 2022-09-07 NOTE — Assessment & Plan Note (Addendum)
Patient has had the spinal fusion.  Discussed with patient about icing regimen and home exercises.  Has responded extremely well though to osteopathic manipulation.  Trying to avoid to new medications and is taking Advil more on an as-needed basis.  Given some hydroxyzine that has helped her previously with some of the chronic urticaria patient does have a couple areas that do seem to be giving more trouble.  Follow-up with me again in 6 to 8 weeks.  Total time with patient 32 minutes this does not include the procedure of osteopathic manipulation

## 2022-09-08 ENCOUNTER — Other Ambulatory Visit (HOSPITAL_BASED_OUTPATIENT_CLINIC_OR_DEPARTMENT_OTHER): Payer: Self-pay

## 2022-09-27 NOTE — Progress Notes (Unsigned)
    Subjective:    Patient ID: Madison [name], female    DOB: [birth year], 36 y.o.   MRN: [id number]      HPI Madison [name] is here for No chief complaint on file.    She is here for an acute visit for cold symptoms.   Her symptoms started   She is experiencing   She has tried taking      Medications and allergies reviewed with patient and updated if appropriate.  Current Outpatient Medications on File Prior to Visit  Medication Sig Dispense Refill   albuterol (VENTOLIN HFA) 108 (90 Base) MCG/ACT inhaler Inhale 2 puffs into the lungs every 4 (four) hours as needed for wheezing or shortness of breath. 8.5 g 2   COVID-19 mRNA vaccine 2023-2024 (COMIRNATY) syringe Inject into the muscle. 0.3 mL 0   fexofenadine (ALLEGRA) 30 MG tablet Take 30 mg by mouth daily.      hydrOXYzine (ATARAX) 10 MG tablet Take 1 tablet (10 mg total) by mouth 3 (three) times daily as needed. 270 tablet 0   ibuprofen (ADVIL) 600 MG tablet Take 1 tablet (600 mg total) by mouth 3 (three) times daily. 90 tablet 0   norethindrone-ethinyl estradiol-FE (LOESTRIN FE 1/20) 1-20 MG-MCG tablet Take 1 tablet every day by oral route for 84 days. 84 tablet 2   Probiotic Product (PROBIOTIC PO) Take by mouth.     Spacer/Aero-Holding Chambers (AEROCHAMBER PLUS WITH MASK) inhaler Use as directed. 1 each 0   triamcinolone cream (KENALOG) 0.1 % Apply to affected area(s) topically 2 (two) times daily. 30 g 0   No current facility-administered medications on file prior to visit.    Review of Systems     Objective:  There were no vitals filed for this visit. BP Readings from Last 3 Encounters:  09/07/22 100/60  07/27/22 102/66  06/28/22 112/84   Wt Readings from Last 3 Encounters:  09/07/22 140 lb (63.5 kg)  07/27/22 141 lb (64 kg)  06/28/22 148 lb (67.1 kg)   There is no height or weight on file to calculate BMI.    Physical Exam         Assessment & Plan:    See Problem List for Assessment and Plan  of chronic medical problems.

## 2022-09-28 ENCOUNTER — Other Ambulatory Visit (HOSPITAL_BASED_OUTPATIENT_CLINIC_OR_DEPARTMENT_OTHER): Payer: Self-pay

## 2022-09-28 ENCOUNTER — Encounter: Payer: Self-pay | Admitting: Internal Medicine

## 2022-09-28 ENCOUNTER — Ambulatory Visit (INDEPENDENT_AMBULATORY_CARE_PROVIDER_SITE_OTHER): Payer: No Typology Code available for payment source | Admitting: Internal Medicine

## 2022-09-28 VITALS — BP 104/72 | HR 80 | Temp 98.2°F | Ht 63.0 in | Wt 142.0 lb

## 2022-09-28 DIAGNOSIS — J0100 Acute maxillary sinusitis, unspecified: Secondary | ICD-10-CM

## 2022-09-28 DIAGNOSIS — J019 Acute sinusitis, unspecified: Secondary | ICD-10-CM | POA: Insufficient documentation

## 2022-09-28 MED ORDER — HYDROCOD POLI-CHLORPHE POLI ER 10-8 MG/5ML PO SUER
5.0000 mL | Freq: Two times a day (BID) | ORAL | 0 refills | Status: DC | PRN
  Filled 2022-09-28: qty 115, 12d supply, fill #0

## 2022-09-28 MED ORDER — AMOXICILLIN-POT CLAVULANATE 875-125 MG PO TABS
1.0000 | ORAL_TABLET | Freq: Two times a day (BID) | ORAL | 0 refills | Status: DC
  Filled 2022-09-28: qty 20, 10d supply, fill #0

## 2022-09-28 NOTE — Assessment & Plan Note (Signed)
Acute Likely bacterial  Start Augmentin 875-125 mg BID x 10 day Tussionex cough syrup every 12 hours as needed otc cold medications Rest, fluid Call if no improvement

## 2022-09-28 NOTE — Patient Instructions (Addendum)
        Medications changes include :   augmentin, cough syrup     Return if symptoms worsen or fail to improve.

## 2022-10-10 NOTE — Progress Notes (Signed)
Madison [name] Madison [name] Phone: [phone] Subjective:   Fontaine No, am serving as a scribe for Dr. Hulan Saas.  I'm seeing this patient by the request  of:  [name], MD  CC: Neck and low back pain follow-up  FOY:[id number]  Madison [name] is a 36 y.o. female coming in with complaint of back and neck pain. OMT 09/07/2022. Patient states that R scapula and R lower back pain have increased due to sleeping at inlaws.   Medications patient has been prescribed: Hydroxizine  Taking:         Reviewed prior external information including notes and imaging from previsou exam, outside providers and external EMR if available.   As well as notes that were available from care everywhere and other healthcare systems.  Past medical history, social, surgical and family history all reviewed in electronic medical record.  No pertanent information unless stated regarding to the chief complaint.   Past Medical History:  Diagnosis Date   Allergy    Asthma    Hx of migraines 01/012003   can tell when they come on, treats with excedrin migraine.   Scoliosis 10/24/1995   Vitamin D deficiency     Allergies  Allergen Reactions   Kiwi Extract Anaphylaxis   Prednisone     Tingling in hands and face   Meloxicam     heartburn     Review of Systems:  No headache, visual changes, nausea, vomiting, diarrhea, constipation, dizziness, abdominal pain, skin rash, fevers, chills, night sweats, weight loss, swollen lymph nodes, body aches, joint swelling, chest pain, shortness of breath, mood changes. POSITIVE muscle aches  Objective  Blood pressure 110/76, pulse 71, height 5' 3"  (1.6 m), weight 140 lb (63.5 kg), SpO2 99 %.   General: No apparent distress alert and oriented x3 mood and affect normal, dressed appropriately.  HEENT: Pupils equal, extraocular movements intact  Respiratory: Patient's speak in full sentences  and does not appear short of breath  Cardiovascular: No lower extremity edema, non tender, no erythema  Significant scoliosis noted some tightness more on the right side of the parascapular region.  Patient does have more tightness in the left side of the lower back.  Osteopathic findings  C2 flexed rotated and side bent right T3 extended rotated and side bent right inhaled rib T9 extended rotated and side bent left L2 flexed rotated and side bent left L5 flexed rotated and side bent left Sacrum right on right       Assessment and Plan:  Scoliosis Still doing relatively well at the moment.  Does have the fusion but does respond well to the muscle energy.  Discussed with patient icing regimen and home exercises.  Increase activity slowly otherwise.  Follow-up again in 6 to 8 weeks    Nonallopathic problems  Decision today to treat with OMT was based on Physical Exam  After verbal consent patient was treated with  ME, FPR techniques in cervical, rib, thoracic, lumbar, and sacral  areas  Patient tolerated the procedure well with improvement in symptoms  Patient given exercises, stretches and lifestyle modifications  See medications in patient instructions if given  Patient will follow up in 4-8 weeks     The above documentation has been reviewed and is accurate and complete Lyndal Pulley, DO         Note: This dictation was prepared with Dragon dictation along with smaller phrase technology. Any transcriptional  errors that result from this process are unintentional.

## 2022-10-18 ENCOUNTER — Ambulatory Visit (INDEPENDENT_AMBULATORY_CARE_PROVIDER_SITE_OTHER): Payer: No Typology Code available for payment source | Admitting: Family Medicine

## 2022-10-18 VITALS — BP 110/76 | HR 71 | Ht 63.0 in | Wt 140.0 lb

## 2022-10-18 DIAGNOSIS — M9902 Segmental and somatic dysfunction of thoracic region: Secondary | ICD-10-CM | POA: Diagnosis not present

## 2022-10-18 DIAGNOSIS — M9904 Segmental and somatic dysfunction of sacral region: Secondary | ICD-10-CM | POA: Diagnosis not present

## 2022-10-18 DIAGNOSIS — M9908 Segmental and somatic dysfunction of rib cage: Secondary | ICD-10-CM

## 2022-10-18 DIAGNOSIS — M41125 Adolescent idiopathic scoliosis, thoracolumbar region: Secondary | ICD-10-CM

## 2022-10-18 DIAGNOSIS — M9901 Segmental and somatic dysfunction of cervical region: Secondary | ICD-10-CM | POA: Diagnosis not present

## 2022-10-18 DIAGNOSIS — M9903 Segmental and somatic dysfunction of lumbar region: Secondary | ICD-10-CM

## 2022-10-18 NOTE — Patient Instructions (Signed)
You are awesome That bed at [facility] must be horrible Get back in the routine  See me again in 5-6 weeks

## 2022-10-18 NOTE — Assessment & Plan Note (Signed)
Still doing relatively well at the moment.  Does have the fusion but does respond well to the muscle energy.  Discussed with patient icing regimen and home exercises.  Increase activity slowly otherwise.  Follow-up again in 6 to 8 weeks

## 2022-11-17 ENCOUNTER — Other Ambulatory Visit: Payer: Self-pay | Admitting: Family Medicine

## 2022-11-17 ENCOUNTER — Other Ambulatory Visit (HOSPITAL_BASED_OUTPATIENT_CLINIC_OR_DEPARTMENT_OTHER): Payer: Self-pay

## 2022-11-17 MED ORDER — TIZANIDINE HCL 4 MG PO TABS
4.0000 mg | ORAL_TABLET | Freq: Every day | ORAL | 0 refills | Status: DC
  Filled 2022-11-17: qty 30, 30d supply, fill #0

## 2022-11-22 NOTE — Progress Notes (Signed)
[name]  [place] [address] [place] Phone: [phone] Subjective:   [name], am serving as a scribe for Dr. Hulan Saas.  I'm seeing this patient by the request  of:  [name], MD  CC: Right shoulder and back pain  [id number]  Madison [name] is a 37 y.o. female coming in with complaint of back and neck pain. OMT 10/18/2022. Patient states worse since last visit. Here for manipulation  Medications patient has been prescribed: Zanaflex  Taking:         Reviewed prior external information including notes and imaging from previsou exam, outside providers and external EMR if available.   As well as notes that were available from care everywhere and other healthcare systems.  Past medical history, social, surgical and family history all reviewed in electronic medical record.  No pertanent information unless stated regarding to the chief complaint.   Past Medical History:  Diagnosis Date   Allergy    Asthma    Hx of migraines 01/012003   can tell when they come on, treats with excedrin migraine.   Scoliosis 10/24/1995   Vitamin D deficiency     Allergies  Allergen Reactions   Kiwi Extract Anaphylaxis   Prednisone     Tingling in hands and face   Meloxicam     heartburn     Review of Systems:  No headache, visual changes, nausea, vomiting, diarrhea, constipation, dizziness, abdominal pain, skin rash, fevers, chills, night sweats, weight loss, swollen lymph nodes, body aches, joint swelling, chest pain, shortness of breath, mood changes. POSITIVE muscle aches  Objective  Blood pressure 104/64, pulse 93, height '5\' 3"'$  (1.6 m), weight 139 lb (63 kg), SpO2 98 %.   General: No apparent distress alert and oriented x3 mood and affect normal, dressed appropriately.  HEENT: Pupils equal, extraocular movements intact  Respiratory: Patient's speak in full sentences and does not appear short of breath   Cardiovascular: No lower extremity edema, non tender, no erythema  Right shoulder exam does have some positive impingement noted, significant positive crossover test noted.  Swelling noted over the [facility] joint  Osteopathic findings  C2 flexed rotated and side bent right C5 flexed rotated and side bent right  T3 extended rotated and side bent right inhaled rib T8 extended rotated and side bent left L2 flexed rotated and side bent right Sacrum right on right   After verbal consent patient was prepped with alcohol swab and with a 25-gauge half inch needle injected into the right acromioclavicular joint with a total of 0.5 cc of 0.5% Marcaine and 0.5 cc of Kenalog 40 mg/mL    Assessment and Plan:  Arthralgia of right acromioclavicular joint New problem but patient does have inflammation of feels over the acromioclavicular joint.  Affecting daily activities, waking her up at night.  Patient did respond well to the injection.  I discussed icing regimen and home exercises.  Follow-up again in 6 to 8 weeks.  Scoliosis Scoliosis still noted.  Discussed posture and ergonomics, discussed which activities to do and which ones to avoid, increase activity slowly.  Follow-up again in 6 to 8 weeks    Nonallopathic problems  Decision today to treat with OMT was based on Physical Exam  After verbal consent patient was treated with HVLA, ME, FPR techniques in cervical, rib, thoracic, lumbar, and sacral  areas  Patient tolerated the procedure well with improvement in symptoms  Patient given exercises, stretches and  lifestyle modifications  See medications in patient instructions if given  Patient will follow up in 4-8 weeks    The above documentation has been reviewed and is accurate and complete Madison Pulley, DO          Note: This dictation was prepared with Dragon dictation along with smaller phrase technology. Any transcriptional errors that result from this process are  unintentional.

## 2022-11-28 ENCOUNTER — Ambulatory Visit: Payer: 59 | Admitting: Family Medicine

## 2022-11-28 VITALS — BP 104/64 | HR 93 | Ht 63.0 in | Wt 139.0 lb

## 2022-11-28 DIAGNOSIS — M9902 Segmental and somatic dysfunction of thoracic region: Secondary | ICD-10-CM

## 2022-11-28 DIAGNOSIS — M25511 Pain in right shoulder: Secondary | ICD-10-CM

## 2022-11-28 DIAGNOSIS — M9908 Segmental and somatic dysfunction of rib cage: Secondary | ICD-10-CM

## 2022-11-28 DIAGNOSIS — M9904 Segmental and somatic dysfunction of sacral region: Secondary | ICD-10-CM | POA: Diagnosis not present

## 2022-11-28 DIAGNOSIS — M9903 Segmental and somatic dysfunction of lumbar region: Secondary | ICD-10-CM | POA: Diagnosis not present

## 2022-11-28 DIAGNOSIS — M9901 Segmental and somatic dysfunction of cervical region: Secondary | ICD-10-CM | POA: Diagnosis not present

## 2022-11-28 NOTE — Assessment & Plan Note (Signed)
New problem but patient does have inflammation of feels over the acromioclavicular joint.  Affecting daily activities, waking her up at night.  Patient did respond well to the injection.  I discussed icing regimen and home exercises.  Follow-up again in 6 to 8 weeks.

## 2022-11-28 NOTE — Patient Instructions (Addendum)
Good to see you Injected the shoulder Keep hands within peripheral vision See you again in 6 weeks

## 2022-11-28 NOTE — Assessment & Plan Note (Signed)
Scoliosis still noted.  Discussed posture and ergonomics, discussed which activities to do and which ones to avoid, increase activity slowly.  Follow-up again in 6 to 8 weeks

## 2022-11-29 ENCOUNTER — Encounter: Payer: Self-pay | Admitting: Family Medicine

## 2022-12-18 ENCOUNTER — Encounter: Payer: Self-pay | Admitting: Family Medicine

## 2023-01-09 DIAGNOSIS — Z01419 Encounter for gynecological examination (general) (routine) without abnormal findings: Secondary | ICD-10-CM | POA: Diagnosis not present

## 2023-01-09 DIAGNOSIS — R319 Hematuria, unspecified: Secondary | ICD-10-CM | POA: Diagnosis not present

## 2023-01-09 DIAGNOSIS — Z6825 Body mass index (BMI) 25.0-25.9, adult: Secondary | ICD-10-CM | POA: Diagnosis not present

## 2023-01-09 NOTE — Progress Notes (Unsigned)
[place] Phone: [phone] Subjective:   Madison Madison [name], am serving as a scribe for Dr. Hulan Saas.  I'm seeing this patient by the request  of:  [name], MD  CC: back and neck pain   [id number]  Madison Madison [name] is a 37 y.o. female coming in with complaint of back and neck pain. OMT 11/28/2022. Patient states that her lower back is doing better.   Had injection in R shoulder last visit. It was helpful but still having pain with flexion and abduction. Feels a pulling in back of shoulder with motion. Push ups and weighted uppper body exercises increase her pain.   Medications patient has been prescribed: Zanaflex  Taking:         Reviewed prior external information including notes and imaging from previsou exam, outside providers and external EMR if available.   As well as notes that were available from care everywhere and other healthcare systems.  Past medical history, social, surgical and family history all reviewed in electronic medical record.  Madison [name] pertanent information unless stated regarding to the chief complaint.   Past Medical History:  Diagnosis Date   Allergy    Asthma    Hx of migraines 01/012003   can tell when they come on, treats with excedrin migraine.   Scoliosis 10/24/1995   Vitamin D deficiency     Allergies  Allergen Reactions   Kiwi Extract Anaphylaxis   Prednisone     Tingling in hands and face   Meloxicam     heartburn     Review of Systems:  Madison [name] headache, visual changes, nausea, vomiting, diarrhea, constipation, dizziness, abdominal pain, skin rash, fevers, chills, night sweats, weight loss, swollen lymph nodes, body aches, joint swelling, chest pain, shortness of breath, mood changes. POSITIVE muscle aches  Objective  Blood pressure 100/72, pulse 68, height 5\' 3"  (1.6 m), weight 138 lb (62.6 kg), SpO2 98 %.   General: Madison [name] apparent distress alert and  oriented x3 mood and affect normal, dressed appropriately.  HEENT: Pupils equal, extraocular movements intact  Respiratory: Patient's speak in full sentences and does not appear short of breath  Cardiovascular: Madison [name] lower extremity edema, non tender, Madison [name] erythema  Scoliosis of the back noted,  TTP in the Right scapula region  Neck tight on right, negative spurling patient's right shoulder does have some positive impingement still noted but negative crossover.  Patient does have some limited range of motion that is different than previous exam.  5 out of 5 strength of the rotator cuff.  Limited muscular skeletal ultrasound was performed and interpreted by Hulan Saas, M  Limited ultrasound shows complete resolve of the hypoechoic changes of the acromioclavicular joint previously.  Patient does have some mild thickening of the anterior capsule noted of the shoulder.  Rotator cuff appears to be intact still. 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  Now does have signs that I am concerned for more of a possibility of a frozen shoulder could be contributing.  Discussed icing regimen and home exercises, discussed which activities to do and  which ones to avoid, increase slowly.  Follow-up again in 6 to 8 weeks  Scoliosis Chronic problem overall but is doing relatively well.  Some mild increase in pain in the right parascapular area secondary to likely favoring the shoulder some.  Discussed icing regimen and home exercises, discussed which activities to do and which ones to avoid.  Increase activity slowly.  Follow-up again in 6 to 8  weeks will be traveling in the interim    Nonallopathic problems  Decision today to treat with OMT was based on Physical Exam  After verbal consent patient was treated with HVLA, ME, FPR techniques in cervical, rib, thoracic, lumbar, and sacral  areas  Patient tolerated the procedure well with improvement in symptoms  Patient given exercises, stretches and lifestyle modifications  See medications in patient instructions if given  Patient will follow up in 4-8 weeks     The above documentation has been reviewed and is accurate and complete Madison Pulley, DO         Note: This dictation was prepared with Dragon dictation along with smaller phrase technology. Any transcriptional errors that result from this process are unintentional.

## 2023-01-10 ENCOUNTER — Other Ambulatory Visit (HOSPITAL_BASED_OUTPATIENT_CLINIC_OR_DEPARTMENT_OTHER): Payer: Self-pay

## 2023-01-11 ENCOUNTER — Ambulatory Visit: Payer: 59 | Admitting: Family Medicine

## 2023-01-11 ENCOUNTER — Other Ambulatory Visit: Payer: Self-pay

## 2023-01-11 ENCOUNTER — Encounter: Payer: Self-pay | Admitting: Family Medicine

## 2023-01-11 VITALS — BP 100/72 | HR 68 | Ht 63.0 in | Wt 138.0 lb

## 2023-01-11 DIAGNOSIS — M9902 Segmental and somatic dysfunction of thoracic region: Secondary | ICD-10-CM | POA: Diagnosis not present

## 2023-01-11 DIAGNOSIS — M9904 Segmental and somatic dysfunction of sacral region: Secondary | ICD-10-CM | POA: Diagnosis not present

## 2023-01-11 DIAGNOSIS — M9901 Segmental and somatic dysfunction of cervical region: Secondary | ICD-10-CM | POA: Diagnosis not present

## 2023-01-11 DIAGNOSIS — M9908 Segmental and somatic dysfunction of rib cage: Secondary | ICD-10-CM

## 2023-01-11 DIAGNOSIS — M25511 Pain in right shoulder: Secondary | ICD-10-CM | POA: Diagnosis not present

## 2023-01-11 DIAGNOSIS — M9903 Segmental and somatic dysfunction of lumbar region: Secondary | ICD-10-CM

## 2023-01-11 DIAGNOSIS — M41125 Adolescent idiopathic scoliosis, thoracolumbar region: Secondary | ICD-10-CM

## 2023-01-11 NOTE — Patient Instructions (Signed)
Keep moving shoulder Have a great time in [place] See me in 6-8 weeks

## 2023-01-11 NOTE — Assessment & Plan Note (Signed)
Swelling significantly improved.  Now does have signs that I am concerned for more of a possibility of a frozen shoulder could be contributing.  Discussed icing regimen and home exercises, discussed which activities to do and which ones to avoid, increase slowly.  Follow-up again in 6 to 8 weeks

## 2023-01-11 NOTE — Assessment & Plan Note (Signed)
Chronic problem overall but is doing relatively well.  Some mild increase in pain in the right parascapular area secondary to likely favoring the shoulder some.  Discussed icing regimen and home exercises, discussed which activities to do and which ones to avoid.  Increase activity slowly.  Follow-up again in 6 to 8 weeks will be traveling in the interim

## 2023-01-18 ENCOUNTER — Ambulatory Visit: Payer: No Typology Code available for payment source | Admitting: Physician Assistant

## 2023-01-19 ENCOUNTER — Other Ambulatory Visit (HOSPITAL_COMMUNITY): Payer: Self-pay

## 2023-01-24 ENCOUNTER — Other Ambulatory Visit (HOSPITAL_BASED_OUTPATIENT_CLINIC_OR_DEPARTMENT_OTHER): Payer: Self-pay

## 2023-01-24 ENCOUNTER — Telehealth: Payer: 59 | Admitting: Physician Assistant

## 2023-01-24 DIAGNOSIS — H109 Unspecified conjunctivitis: Secondary | ICD-10-CM

## 2023-01-24 MED ORDER — POLYMYXIN B-TRIMETHOPRIM 10000-0.1 UNIT/ML-% OP SOLN
1.0000 [drp] | OPHTHALMIC | 0 refills | Status: DC
  Filled 2023-01-24: qty 10, 5d supply, fill #0

## 2023-01-24 NOTE — Progress Notes (Signed)

## 2023-02-05 ENCOUNTER — Other Ambulatory Visit (HOSPITAL_BASED_OUTPATIENT_CLINIC_OR_DEPARTMENT_OTHER): Payer: Self-pay

## 2023-02-05 DIAGNOSIS — N939 Abnormal uterine and vaginal bleeding, unspecified: Secondary | ICD-10-CM | POA: Diagnosis not present

## 2023-02-05 DIAGNOSIS — N941 Unspecified dyspareunia: Secondary | ICD-10-CM | POA: Diagnosis not present

## 2023-02-05 MED ORDER — MISOPROSTOL 200 MCG PO TABS
ORAL_TABLET | ORAL | 0 refills | Status: DC
  Filled 2023-02-05: qty 1, 1d supply, fill #0

## 2023-02-25 DIAGNOSIS — E785 Hyperlipidemia, unspecified: Secondary | ICD-10-CM | POA: Insufficient documentation

## 2023-02-25 NOTE — Progress Notes (Unsigned)
Subjective:    Patient ID: Madison [name], female    DOB: 10/18/1986, 37 y.o.   MRN: [id number]      HPI Madison [name] is here for a Physical exam and her chronic medical problems.    More sinus pressure headaches.  Asthma has been ok.   Takes a MVI  Medications and allergies reviewed with patient and updated if appropriate.  Current Outpatient Medications on File Prior to Visit  Medication Sig Dispense Refill   albuterol (VENTOLIN HFA) 108 (90 Base) MCG/ACT inhaler Inhale 2 puffs into the lungs every 4 (four) hours as needed for wheezing or shortness of breath. 8.5 g 2   fexofenadine (ALLEGRA) 30 MG tablet Take 30 mg by mouth daily.      hydrOXYzine (ATARAX) 10 MG tablet Take 1 tablet (10 mg total) by mouth 3 (three) times daily as needed. 270 tablet 0   ibuprofen (ADVIL) 600 MG tablet Take 1 tablet (600 mg total) by mouth 3 (three) times daily. 90 tablet 0   misoprostol (CYTOTEC) 200 MCG tablet Insert one tablet per vagina the night before procedure 1 tablet 0   Multiple Vitamin (MULTIVITAMIN) capsule Take 1 capsule by mouth daily.     norethindrone-ethinyl estradiol-FE (LOESTRIN FE 1/20) 1-20 MG-MCG tablet Take 1 tablet every day by oral route for 84 days. 84 tablet 2   Probiotic Product (PROBIOTIC PO) Take by mouth.     Spacer/Aero-Holding Chambers (AEROCHAMBER PLUS WITH MASK) inhaler Use as directed. 1 each 0   tiZANidine (ZANAFLEX) 4 MG tablet Take 1 tablet (4 mg total) by mouth at bedtime. 30 tablet 0   No current facility-administered medications on file prior to visit.    Review of Systems  Constitutional:  Negative for fever.  Eyes:  Positive for visual disturbance (blurry at times - up to date with eye exam).  Respiratory:  Negative for cough, shortness of breath and wheezing.   Cardiovascular:  Negative for chest pain, palpitations and leg swelling.  Gastrointestinal:  Negative for abdominal pain, blood in stool, constipation and diarrhea.       No gerd   Genitourinary:  Negative for dysuria.  Musculoskeletal:  Positive for back pain (chronic - scoliosis). Negative for arthralgias.  Skin:  Negative for rash.       Chronic hives - intermittent  Neurological:  Positive for headaches. Negative for dizziness and light-headedness.  Psychiatric/Behavioral:  Negative for dysphoric mood. The patient is nervous/anxious.        Objective:   Vitals:   02/27/23 0755  BP: 104/72  Pulse: 80  Temp: 98.3 F (36.8 C)  SpO2: 99%   Filed Weights   02/27/23 0755  Weight: 140 lb (63.5 kg)   Body mass index is 24.8 kg/m.  BP Readings from Last 3 Encounters:  02/27/23 104/72  01/11/23 100/72  11/28/22 104/64    Wt Readings from Last 3 Encounters:  02/27/23 140 lb (63.5 kg)  01/11/23 138 lb (62.6 kg)  11/28/22 139 lb (63 kg)       Physical Exam Constitutional: She appears well-developed and well-nourished. No distress.  HENT:  Head: Normocephalic and atraumatic.  Right Ear: External ear normal. Normal ear canal and TM Left Ear: External ear normal.  Normal ear canal and TM Mouth/Throat: Oropharynx is clear and moist.  Eyes: Conjunctivae normal.  Neck: Neck supple. No tracheal deviation present. No thyromegaly present.  No carotid bruit  Cardiovascular: Normal rate, regular rhythm and normal heart sounds.   No murmur  heard.  No edema. Pulmonary/Chest: Effort normal and breath sounds normal. No respiratory distress. She has no wheezes. She has no rales.  Breast: deferred   Abdominal: Soft. She exhibits no distension. There is no tenderness.  Lymphadenopathy: She has no cervical adenopathy.  Skin: Skin is warm and dry. She is not diaphoretic.  Psychiatric: She has a normal mood and affect. Her behavior is normal.     Lab Results  Component Value Date   WBC 6.9 02/10/2022   HGB 13.7 02/10/2022   HCT 41.3 02/10/2022   PLT 259.0 02/10/2022   GLUCOSE 80 02/10/2022   CHOL 206 (H) 02/10/2022   TRIG 89.0 02/10/2022   HDL 54.40  02/10/2022   LDLCALC 134 (H) 02/10/2022   ALT 18 02/10/2022   AST 18 02/10/2022   NA 138 02/10/2022   K 4.3 02/10/2022   CL 103 02/10/2022   CREATININE 0.68 02/10/2022   BUN 12 02/10/2022   CO2 26 02/10/2022   TSH 0.72 02/10/2022   HGBA1C 4.7 02/02/2020         Assessment & Plan:   Physical exam: Screening blood work  ordered Exercise  goes to gym regularly - cardio 2/week, weights 3/week, run club once a week Weight  normal Substance abuse  none   Reviewed recommended immunizations.   Health Maintenance  Topic Date Due   Hepatitis C Screening  Never done   COVID-19 Vaccine (9 - 2023-24 season) 03/13/2023 (Originally 10/20/2022)   INFLUENZA VACCINE  05/24/2023   PAP SMEAR-Modifier  12/18/2023   DTaP/Tdap/Td (3 - Td or Tdap) 07/27/2032   HIV Screening  Completed   HPV VACCINES  Aged Out          See Problem List for Assessment and Plan of chronic medical problems.

## 2023-02-25 NOTE — Patient Instructions (Addendum)
    Blood work was ordered.   The lab is on the first floor.    Medications changes include :   none     Return in about 1 year (around 02/27/2024) for Physical Exam.   Health Maintenance, Female Adopting a healthy lifestyle and getting preventive care are important in promoting health and wellness. Ask your health care provider about: The right schedule for you to have regular tests and exams. Things you can do on your own to prevent diseases and keep yourself healthy. What should I know about diet, weight, and exercise? Eat a healthy diet  Eat a diet that includes plenty of vegetables, fruits, low-fat dairy products, and lean protein. Do not eat a lot of foods that are high in solid fats, added sugars, or sodium. Maintain a healthy weight Body mass index (BMI) is used to identify weight problems. It estimates body fat based on height and weight. Your health care provider can help determine your BMI and help you achieve or maintain a healthy weight. Get regular exercise Get regular exercise. This is one of the most important things you can do for your health. Most adults should: Exercise for at least 150 minutes each week. The exercise should increase your heart rate and make you sweat (moderate-intensity exercise). Do strengthening exercises at least twice a week. This is in addition to the moderate-intensity exercise. Spend less time sitting. Even light physical activity can be beneficial. Watch cholesterol and blood lipids Have your blood tested for lipids and cholesterol at 38 years of age, then have this test every 5 years. Have your cholesterol levels checked more often if: Your lipid or cholesterol levels are high. You are older than 38 years of age. You are at high risk for heart disease. What should I know about cancer screening? Depending on your health history and family history, you may need to have cancer screening at various ages. This may include screening  for: Breast cancer. Cervical cancer. Colorectal cancer. Skin cancer. Lung cancer. What should I know about heart disease, diabetes, and high blood pressure? Blood pressure and heart disease High blood pressure causes heart disease and increases the risk of stroke. This is more likely to develop in people who have high blood pressure readings or are overweight. Have your blood pressure checked: Every 3-5 years if you are 18-39 years of age. Every year if you are 40 years old or older. Diabetes Have regular diabetes screenings. This checks your fasting blood sugar level. Have the screening done: Once every three years after age 40 if you are at a normal weight and have a low risk for diabetes. More often and at a younger age if you are overweight or have a high risk for diabetes. What should I know about preventing infection? Hepatitis B If you have a higher risk for hepatitis B, you should be screened for this virus. Talk with your health care provider to find out if you are at risk for hepatitis B infection. Hepatitis C Testing is recommended for: Everyone born from 1945 through 1965. Anyone with known risk factors for hepatitis C. Sexually transmitted infections (STIs) Get screened for STIs, including gonorrhea and chlamydia, if: You are sexually active and are younger than 38 years of age. You are older than 38 years of age and your health care provider tells you that you are at risk for this type of infection. Your sexual activity has changed since you were last screened, and you are at   increased risk for chlamydia or gonorrhea. Ask your health care provider if you are at risk. Ask your health care provider about whether you are at high risk for HIV. Your health care provider may recommend a prescription medicine to help prevent HIV infection. If you choose to take medicine to prevent HIV, you should first get tested for HIV. You should then be tested every 3 months for as long as you  are taking the medicine. Pregnancy If you are about to stop having your period (premenopausal) and you may become pregnant, seek counseling before you get pregnant. Take 400 to 800 micrograms (mcg) of folic acid every day if you become pregnant. Ask for birth control (contraception) if you want to prevent pregnancy. Osteoporosis and menopause Osteoporosis is a disease in which the bones lose minerals and strength with aging. This can result in bone fractures. If you are 65 years old or older, or if you are at risk for osteoporosis and fractures, ask your health care provider if you should: Be screened for bone loss. Take a calcium or vitamin D supplement to lower your risk of fractures. Be given hormone replacement therapy (HRT) to treat symptoms of menopause. Follow these instructions at home: Alcohol use Do not drink alcohol if: Your health care provider tells you not to drink. You are pregnant, may be pregnant, or are planning to become pregnant. If you drink alcohol: Limit how much you have to: 0-1 drink a day. Know how much alcohol is in your drink. In the [place] one drink equals one 12 oz bottle of beer (355 mL), one 5 oz glass of wine (148 mL), or one 1 oz glass of hard liquor (44 mL). Lifestyle Do not use any products that contain nicotine or tobacco. These products include cigarettes, chewing tobacco, and vaping devices, such as e-cigarettes. If you need help quitting, ask your health care provider. Do not use street drugs. Do not share needles. Ask your health care provider for help if you need support or information about quitting drugs. General instructions Schedule regular health, dental, and eye exams. Stay current with your vaccines. Tell your health care provider if: You often feel depressed. You have ever been abused or do not feel safe at home. Summary Adopting a healthy lifestyle and getting preventive care are important in promoting health and wellness. Follow your  health care provider's instructions about healthy diet, exercising, and getting tested or screened for diseases. Follow your health care provider's instructions on monitoring your cholesterol and blood pressure. This information is not intended to replace advice given to you by your health care provider. Make sure you discuss any questions you have with your health care provider. Document Revised: 02/28/2021 Document Reviewed: 02/28/2021 Elsevier Patient Education  2023 [employer] Inc[employer]  

## 2023-02-26 NOTE — Progress Notes (Unsigned)
[name] [address] [place] [address] Phone: [phone] Subjective:   Madison [name], am serving as a scribe for Dr. Antoine Primas.  I'm seeing this patient by the request  of:  Pincus Sanes, MD  CC: Back and neck pain follow-up  [name]  Madison [name] is a 37 y.o. female coming in with complaint of back and neck pain. OMT 01/11/2023. Patient states doing well. Same per usual. No new concerns.  Patient does have tightness noted over the paraspinal musculature states.  Nothing now severe.  Traveling went well.  Even was able to carry bags on the tube in [place] without any significant difficulty.  Medications patient has been prescribed: zanaflex  Taking:         Reviewed prior external information including notes and imaging from previsou exam, outside providers and external EMR if available.   As well as notes that were available from care everywhere and other healthcare systems.  Past medical history, social, surgical and family history all reviewed in electronic medical record.  No pertanent information unless stated regarding to the chief complaint.   Past Medical History:  Diagnosis Date   Allergy    Asthma    Hx of migraines 01/012003   can tell when they come on, treats with excedrin migraine.   Scoliosis 10/24/1995   Vitamin D deficiency     Allergies  Allergen Reactions   Kiwi Extract Anaphylaxis   Prednisone     Tingling in hands and face   Meloxicam     heartburn     Review of Systems:  No headache, visual changes, nausea, vomiting, diarrhea, constipation, dizziness, abdominal pain, skin rash, fevers, chills, night sweats, weight loss, swollen lymph nodes, body aches, joint swelling, chest pain, shortness of breath, mood changes. POSITIVE muscle aches  Objective  Blood pressure 104/72, pulse 80, height 5\' 3"  (1.6 m), weight 140 lb (63.5 kg), SpO2 99 %.   General: No apparent distress alert and  oriented x3 mood and affect normal, dressed appropriately.  HEENT: Pupils equal, extraocular movements intact  Respiratory: Patient's speak in full sentences and does not appear short of breath  Cardiovascular: No lower extremity edema, non tender, no erythema  Significant scoliosis of the back noted.  Tenderness to palpation of the paraspinal musculature.  Does have some limited sidebending bilaterally.  Osteopathic findings  C2 flexed rotated and side bent right T3 extended rotated and side bent right inhaled rib T9 extended rotated and side bent left L2 flexed rotated and side bent right Sacrum right on right       Assessment and Plan:  Scoliosis Patient has done significantly better though over the course of time.  Discussed icing regimen and home exercises, which activities to do and which ones to avoid, increase activity slowly.  Follow-up with me again in 6 to 8 weeks.  No other changes in medical management.    Nonallopathic problems  Decision today to treat with OMT was based on Physical Exam  After verbal consent patient was treated with HVLA, ME, FPR techniques in cervical, rib, thoracic, lumbar, and sacral  areas  Patient tolerated the procedure well with improvement in symptoms  Patient given exercises, stretches and lifestyle modifications  See medications in patient instructions if given  Patient will follow up in 4-8 weeks    The above documentation has been reviewed and is accurate and complete [name], DO  Note: This dictation was prepared with Dragon dictation along with smaller phrase technology. Any transcriptional errors that result from this process are unintentional.

## 2023-02-27 ENCOUNTER — Ambulatory Visit: Payer: 59 | Admitting: Family Medicine

## 2023-02-27 ENCOUNTER — Encounter: Payer: Self-pay | Admitting: Internal Medicine

## 2023-02-27 ENCOUNTER — Encounter: Payer: Self-pay | Admitting: Family Medicine

## 2023-02-27 ENCOUNTER — Ambulatory Visit (INDEPENDENT_AMBULATORY_CARE_PROVIDER_SITE_OTHER): Payer: 59 | Admitting: Internal Medicine

## 2023-02-27 VITALS — BP 104/72 | HR 80 | Temp 98.3°F | Ht 63.0 in | Wt 140.0 lb

## 2023-02-27 VITALS — BP 104/72 | HR 80 | Ht 63.0 in | Wt 140.0 lb

## 2023-02-27 DIAGNOSIS — M9903 Segmental and somatic dysfunction of lumbar region: Secondary | ICD-10-CM | POA: Diagnosis not present

## 2023-02-27 DIAGNOSIS — J302 Other seasonal allergic rhinitis: Secondary | ICD-10-CM | POA: Diagnosis not present

## 2023-02-27 DIAGNOSIS — Z0000 Encounter for general adult medical examination without abnormal findings: Secondary | ICD-10-CM | POA: Diagnosis not present

## 2023-02-27 DIAGNOSIS — E538 Deficiency of other specified B group vitamins: Secondary | ICD-10-CM | POA: Diagnosis not present

## 2023-02-27 DIAGNOSIS — M9902 Segmental and somatic dysfunction of thoracic region: Secondary | ICD-10-CM

## 2023-02-27 DIAGNOSIS — E559 Vitamin D deficiency, unspecified: Secondary | ICD-10-CM | POA: Diagnosis not present

## 2023-02-27 DIAGNOSIS — M9908 Segmental and somatic dysfunction of rib cage: Secondary | ICD-10-CM | POA: Diagnosis not present

## 2023-02-27 DIAGNOSIS — J3089 Other allergic rhinitis: Secondary | ICD-10-CM | POA: Diagnosis not present

## 2023-02-27 DIAGNOSIS — M9904 Segmental and somatic dysfunction of sacral region: Secondary | ICD-10-CM

## 2023-02-27 DIAGNOSIS — Z1159 Encounter for screening for other viral diseases: Secondary | ICD-10-CM

## 2023-02-27 DIAGNOSIS — E782 Mixed hyperlipidemia: Secondary | ICD-10-CM | POA: Diagnosis not present

## 2023-02-27 DIAGNOSIS — M9901 Segmental and somatic dysfunction of cervical region: Secondary | ICD-10-CM | POA: Diagnosis not present

## 2023-02-27 DIAGNOSIS — J4520 Mild intermittent asthma, uncomplicated: Secondary | ICD-10-CM | POA: Diagnosis not present

## 2023-02-27 DIAGNOSIS — M41125 Adolescent idiopathic scoliosis, thoracolumbar region: Secondary | ICD-10-CM | POA: Diagnosis not present

## 2023-02-27 LAB — VITAMIN B12: Vitamin B-12: 205 pg/mL — ABNORMAL LOW (ref 211–911)

## 2023-02-27 LAB — CBC WITH DIFFERENTIAL/PLATELET
Basophils Absolute: 0 10*3/uL (ref 0.0–0.1)
Basophils Relative: 0.3 % (ref 0.0–3.0)
Eosinophils Absolute: 0.3 10*3/uL (ref 0.0–0.7)
Eosinophils Relative: 3.6 % (ref 0.0–5.0)
HCT: 41.4 % (ref 36.0–46.0)
Hemoglobin: 13.9 g/dL (ref 12.0–15.0)
Lymphocytes Relative: 20.6 % (ref 12.0–46.0)
Lymphs Abs: 1.9 10*3/uL (ref 0.7–4.0)
MCHC: 33.6 g/dL (ref 30.0–36.0)
MCV: 92.5 fl (ref 78.0–100.0)
Monocytes Absolute: 0.6 10*3/uL (ref 0.1–1.0)
Monocytes Relative: 6.4 % (ref 3.0–12.0)
Neutro Abs: 6.4 10*3/uL (ref 1.4–7.7)
Neutrophils Relative %: 69.1 % (ref 43.0–77.0)
Platelets: 286 10*3/uL (ref 150.0–400.0)
RBC: 4.48 Mil/uL (ref 3.87–5.11)
RDW: 13.5 % (ref 11.5–15.5)
WBC: 9.2 10*3/uL (ref 4.0–10.5)

## 2023-02-27 LAB — LIPID PANEL
Cholesterol: 198 mg/dL (ref 0–200)
HDL: 55.4 mg/dL (ref >39.00)
LDL Cholesterol: 117 mg/dL — ABNORMAL HIGH (ref 0–99)
NonHDL: 142.55
Total CHOL/HDL Ratio: 4
Triglycerides: 130 mg/dL (ref 0.0–149.0)
VLDL: 26 mg/dL (ref 0.0–40.0)

## 2023-02-27 LAB — COMPREHENSIVE METABOLIC PANEL
ALT: 17 U/L (ref 0–35)
AST: 18 U/L (ref 0–37)
Albumin: 4.4 g/dL (ref 3.5–5.2)
Alkaline Phosphatase: 37 U/L — ABNORMAL LOW (ref 39–117)
BUN: 10 mg/dL (ref 6–23)
CO2: 27 mEq/L (ref 19–32)
Calcium: 9.5 mg/dL (ref 8.4–10.5)
Chloride: 103 mEq/L (ref 96–112)
Creatinine, Ser: 0.73 mg/dL (ref 0.40–1.20)
GFR: 104.61 mL/min (ref >60.00)
Glucose, Bld: 83 mg/dL (ref 70–99)
Potassium: 4.2 mEq/L (ref 3.5–5.1)
Sodium: 139 mEq/L (ref 135–145)
Total Bilirubin: 0.3 mg/dL (ref 0.2–1.2)
Total Protein: 7.4 g/dL (ref 6.0–8.3)

## 2023-02-27 LAB — VITAMIN D 25 HYDROXY (VIT D DEFICIENCY, FRACTURES): VITD: 26.56 ng/mL — ABNORMAL LOW (ref 30.00–100.00)

## 2023-02-27 LAB — TSH: TSH: 0.98 u[IU]/mL (ref 0.35–5.50)

## 2023-02-27 NOTE — Assessment & Plan Note (Signed)
Chronic ?Ck B12 level ?

## 2023-02-27 NOTE — Assessment & Plan Note (Signed)
Chronic Check lipid panel, cmp, cbc, tsh Continue lifestyle control Regular exercise and healthy diet encouraged

## 2023-02-27 NOTE — Assessment & Plan Note (Signed)
Chronic Year round Gets sinus headaches Taking allegra daily

## 2023-02-27 NOTE — Assessment & Plan Note (Signed)
Chronic ?Ck D level ?

## 2023-02-27 NOTE — Patient Instructions (Addendum)
Good to see you Thank you so much  Dad is in great hands Keep getting your pump on at the gym  See me again in 6 weeks

## 2023-02-27 NOTE — Assessment & Plan Note (Signed)
Chronic ?Mild, intermittent ?Allergies are main trigger ?Albuterol inhaler prn but rarely uses it ?

## 2023-02-27 NOTE — Assessment & Plan Note (Signed)
Patient has done significantly better though over the course of time.  Discussed icing regimen and home exercises, which activities to do and which ones to avoid, increase activity slowly.  Follow-up with me again in 6 to 8 weeks.  No other changes in medical management.

## 2023-02-28 LAB — HEPATITIS C ANTIBODY: Hepatitis C Ab: NONREACTIVE

## 2023-03-06 DIAGNOSIS — Z3202 Encounter for pregnancy test, result negative: Secondary | ICD-10-CM | POA: Diagnosis not present

## 2023-03-06 DIAGNOSIS — Z30430 Encounter for insertion of intrauterine contraceptive device: Secondary | ICD-10-CM | POA: Diagnosis not present

## 2023-04-06 NOTE — Progress Notes (Deleted)
  [name] [address] [place] [address] Phone: [phone] Subjective:    I'm seeing this patient by the request  of:  Pincus Sanes, MD  CC:   [id number]  Madison [name] is a 37 y.o. female coming in with complaint of back and neck pain. OMT 02/27/2023. Patient states   Medications patient has been prescribed: None  Taking:         Reviewed prior external information including notes and imaging from previsou exam, outside providers and external EMR if available.   As well as notes that were available from care everywhere and other healthcare systems.  Past medical history, social, surgical and family history all reviewed in electronic medical record.  No pertanent information unless stated regarding to the chief complaint.   Past Medical History:  Diagnosis Date   Allergy    Asthma    Hx of migraines 01/012003   can tell when they come on, treats with excedrin migraine.   Scoliosis 10/24/1995   Vitamin D deficiency     Allergies  Allergen Reactions   Kiwi Extract Anaphylaxis   Prednisone     Tingling in hands and face   Meloxicam     heartburn     Review of Systems:  No headache, visual changes, nausea, vomiting, diarrhea, constipation, dizziness, abdominal pain, skin rash, fevers, chills, night sweats, weight loss, swollen lymph nodes, body aches, joint swelling, chest pain, shortness of breath, mood changes. POSITIVE muscle aches  Objective  There were no vitals taken for this visit.   General: No apparent distress alert and oriented x3 mood and affect normal, dressed appropriately.  HEENT: Pupils equal, extraocular movements intact  Respiratory: Patient's speak in full sentences and does not appear short of breath  Cardiovascular: No lower extremity edema, non tender, no erythema  Gait MSK:  Back   Osteopathic findings  C2 flexed rotated and side bent right C6 flexed rotated and side bent left T3  extended rotated and side bent right inhaled rib T9 extended rotated and side bent left L2 flexed rotated and side bent right Sacrum right on right       Assessment and Plan:  No problem-specific Assessment & Plan notes found for this encounter.    Nonallopathic problems  Decision today to treat with OMT was based on Physical Exam  After verbal consent patient was treated with HVLA, ME, FPR techniques in cervical, rib, thoracic, lumbar, and sacral  areas  Patient tolerated the procedure well with improvement in symptoms  Patient given exercises, stretches and lifestyle modifications  See medications in patient instructions if given  Patient will follow up in 4-8 weeks             Note: This dictation was prepared with Dragon dictation along with smaller phrase technology. Any transcriptional errors that result from this process are unintentional.

## 2023-04-09 ENCOUNTER — Other Ambulatory Visit: Payer: Self-pay

## 2023-04-09 ENCOUNTER — Other Ambulatory Visit (HOSPITAL_BASED_OUTPATIENT_CLINIC_OR_DEPARTMENT_OTHER): Payer: Self-pay

## 2023-04-09 ENCOUNTER — Encounter: Payer: Self-pay | Admitting: Family Medicine

## 2023-04-09 ENCOUNTER — Ambulatory Visit: Payer: 59 | Admitting: Family Medicine

## 2023-04-09 VITALS — BP 100/68 | Ht 63.0 in | Wt 142.0 lb

## 2023-04-09 DIAGNOSIS — E559 Vitamin D deficiency, unspecified: Secondary | ICD-10-CM

## 2023-04-09 DIAGNOSIS — M25511 Pain in right shoulder: Secondary | ICD-10-CM

## 2023-04-09 DIAGNOSIS — E538 Deficiency of other specified B group vitamins: Secondary | ICD-10-CM

## 2023-04-09 DIAGNOSIS — M2550 Pain in unspecified joint: Secondary | ICD-10-CM

## 2023-04-09 MED ORDER — GABAPENTIN 100 MG PO CAPS
200.0000 mg | ORAL_CAPSULE | Freq: Every day | ORAL | 3 refills | Status: DC
  Filled 2023-04-09: qty 60, 30d supply, fill #0

## 2023-04-09 MED ORDER — CYANOCOBALAMIN 1000 MCG/ML IJ SOLN
1000.0000 ug | Freq: Once | INTRAMUSCULAR | Status: AC
Start: 2023-04-09 — End: 2023-04-09
  Administered 2023-04-09: 1000 ug via INTRAMUSCULAR

## 2023-04-09 MED ORDER — VITAMIN D (ERGOCALCIFEROL) 1.25 MG (50000 UNIT) PO CAPS
50000.0000 [IU] | ORAL_CAPSULE | ORAL | 0 refills | Status: DC
  Filled 2023-04-09: qty 12, 84d supply, fill #0

## 2023-04-09 NOTE — Progress Notes (Signed)
   [name] [address] [place] [address] Phone: [phone] Subjective:   Madison [name], am serving as a scribe for Dr. Antoine Primas.  I'm seeing this patient by the request  of:  Pincus Sanes, MD  CC: Back and neck pain follow-up  [id number]  [name] is a 37 y.o. female coming in with complaint of back and neck pain. OMT 02/27/2023. Patient states that 5 weeks ago her pain started in R trap and neck but has progressed to tingling in R hand. Also unable to lift arm.   Medications patient has been prescribed:   Taking:         Reviewed prior external information including notes and imaging from previsou exam, outside providers and external EMR if available.   As well as notes that were available from care everywhere and other healthcare systems.  Past medical history, social, surgical and family history all reviewed in electronic medical record.  No pertanent information unless stated regarding to the chief complaint.   Past Medical History:  Diagnosis Date   Allergy    Asthma    Hx of migraines 01/012003   can tell when they come on, treats with excedrin migraine.   Scoliosis 10/24/1995   Vitamin D deficiency     Allergies  Allergen Reactions   Kiwi Extract Anaphylaxis   Prednisone     Tingling in hands and face   Meloxicam     heartburn     Review of Systems:  No headache, visual changes, nausea, vomiting, diarrhea, constipation, dizziness, abdominal pain, skin rash, fevers, chills, night sweats, weight loss, swollen lymph nodes, body aches, joint swelling, chest pain, shortness of breath, mood changes. POSITIVE muscle aches  Objective  Blood pressure 100/68, height 5\' 3"  (1.6 m), weight 142 lb (64.4 kg).   General: No apparent distress alert and oriented x3 mood and affect normal, dressed appropriately.  HEENT: Pupils equal, extraocular movements intact  Respiratory: Patient's speak in full  sentences and does not appear short of breath  Cardiovascular: No lower extremity edema, non tender, no erythema  Low back exam does have some loss of lordosis.  The patient does have scoliosis noted.  Severe tightness noted in the parascapular area Neck exam does have some loss lordosis.  Worsening pain with extension and right-sided sidebending.  Mild increase in radicular symptoms also noted. Multiple trigger points noted in the rhomboid, trapezius, and levator scapula on the right side.  After verbal consent patient was prepped with alcohol swab and with a 25-gauge half inch needle injected with 3 cc of 0.5% Marcaine and 1 cc of Kenalog 40 mg/mL and 4 distinct trigger points in the levator scapula, rhomboid and trapezius.      Assessment and Plan:  Trigger point of right shoulder region Patient given repeat injection today, tolerated the procedure well, discussed differential includes a cervical radiculopathy and will start on low-dose gabapentin.  Prescription sent in today.  Discussed scoliosis and home exercises.  Follow-up again in 6 to 8 weeks  Vitamin D deficiency Failed once weekly vitamin D.      The above documentation has been reviewed and is accurate and complete [name], DO          Note: This dictation was prepared with Dragon dictation along with smaller phrase technology. Any transcriptional errors that result from this process are unintentional.

## 2023-04-09 NOTE — Assessment & Plan Note (Signed)
B12 injection given today as well.

## 2023-04-09 NOTE — Patient Instructions (Addendum)
Once weekly Vit D called in B12 injection given today Trigger points today See me again in 2-4 weeks ok to double book

## 2023-04-09 NOTE — Assessment & Plan Note (Signed)
Patient given repeat injection today, tolerated the procedure well, discussed differential includes a cervical radiculopathy and will start on low-dose gabapentin.  Prescription sent in today.  Discussed scoliosis and home exercises.  Follow-up again in 6 to 8 weeks

## 2023-04-09 NOTE — Assessment & Plan Note (Signed)
Failed once weekly vitamin D.

## 2023-04-10 ENCOUNTER — Ambulatory Visit: Payer: 59 | Admitting: Family Medicine

## 2023-04-11 ENCOUNTER — Other Ambulatory Visit: Payer: 59

## 2023-04-11 DIAGNOSIS — M2550 Pain in unspecified joint: Secondary | ICD-10-CM | POA: Diagnosis not present

## 2023-04-12 ENCOUNTER — Encounter: Payer: Self-pay | Admitting: Family Medicine

## 2023-04-14 LAB — INTRINSIC FACTOR ANTIBODIES: Intrinsic Factor: NEGATIVE

## 2023-04-18 DIAGNOSIS — Z30431 Encounter for routine checking of intrauterine contraceptive device: Secondary | ICD-10-CM | POA: Diagnosis not present

## 2023-04-20 NOTE — Progress Notes (Addendum)
[name] [address] [place] [address] Phone: [phone] Subjective:   Bruce Donath, am serving as a scribe for Dr. Antoine Primas.  I'm seeing this patient by the request  of:  Pincus Sanes, MD  CC: back and neck pain follow up   [id number]  Madison [name] is a 37 y.o. female coming in with complaint of back and neck pain. OMT 04/09/2023. Patient states that she is able to move her arm and neck. Arm is weak if she picks up something heavy. Dull ache in R trap. If xrays are needed today she is prepared.   Medications patient has been prescribed: None       Reviewed prior external information including notes and imaging from previsou exam, outside providers and external EMR if available.   As well as notes that were available from care everywhere and other healthcare systems.  Past medical history, social, surgical and family history all reviewed in electronic medical record.  No pertanent information unless stated regarding to the chief complaint.   Past Medical History:  Diagnosis Date   Allergy    Asthma    Hx of migraines 01/012003   can tell when they come on, treats with excedrin migraine.   Scoliosis 10/24/1995   Vitamin D deficiency     Allergies  Allergen Reactions   Kiwi Extract Anaphylaxis   Prednisone     Tingling in hands and face   Meloxicam     heartburn     Review of Systems:  No headache, visual changes, nausea, vomiting, diarrhea, constipation, dizziness, abdominal pain, skin rash, fevers, chills, night sweats, weight loss, swollen lymph nodes, body aches, joint swelling, chest pain, shortness of breath, mood changes. POSITIVE muscle aches  Objective  Blood pressure 100/72, height 5\' 3"  (1.6 m), weight 137 lb (62.1 kg).   General: No apparent distress alert and oriented x3 mood and affect normal, dressed appropriately.  HEENT: Pupils equal, extraocular movements intact  Respiratory: Patient's  speak in full sentences and does not appear short of breath  Cardiovascular: No lower extremity edema, non tender, no erythema  MSK:  Back scoliosis still noted.  Significant tightness noted.  Right shoulder does have positive impingement noted.  Patient does have discomfort with Neer and Hawkins.  Osteopathic findings  C3 flexed rotated and side bent right C6 flexed rotated and side bent left T3 extended rotated and side bent right inhaled rib T8 extended rotated and side bent left L1-L4 neutral rotated left side bent right Sacrum right on right  After informed written and verbal consent, patient was seated on exam table. Right shoulder was prepped with alcohol swab and utilizing posterior approach, patient's right glenohumeral space was injected with 4:1  marcaine 0.5%: Kenalog 40mg /dL. Patient tolerated the procedure well without immediate complications.   Assessment and Plan:  Right shoulder pain Chronic problem that has not responded extremely well to our other modalities including trigger point injections, as well as a acromioclavicular injection.  Continues to have some limitation in some range of motion and does have some signs and symptoms consistent with rotator cuff arthropathy.  Patient given exercises again.  Injection today, if continuing to have difficulty with hands imaging may be warranted.  Scoliosis Patient does have scoliosis.  Still responding relatively well though to osteopathic manipulation.  Has had some more stress recently and likely contributing to some of the tightness patient not being quite as active.  Follow-up with me again  in 6 to 8 weeks.    Nonallopathic problems  Decision today to treat with OMT was based on Physical Exam  After verbal consent patient was treated with HVLA, ME, FPR techniques in cervical, rib, thoracic, lumbar, and sacral  areas  Patient tolerated the procedure well with improvement in symptoms  Patient given exercises, stretches  and lifestyle modifications  See medications in patient instructions if given  Patient will follow up in 4-8 weeks     The above documentation has been reviewed and is accurate and complete [name], DO         Note: This dictation was prepared with Dragon dictation along with smaller phrase technology. Any transcriptional errors that result from this process are unintentional.

## 2023-04-24 ENCOUNTER — Ambulatory Visit (INDEPENDENT_AMBULATORY_CARE_PROVIDER_SITE_OTHER): Payer: 59

## 2023-04-24 ENCOUNTER — Ambulatory Visit: Payer: 59 | Admitting: Family Medicine

## 2023-04-24 ENCOUNTER — Encounter: Payer: Self-pay | Admitting: Family Medicine

## 2023-04-24 VITALS — BP 100/72 | Ht 63.0 in | Wt 137.0 lb

## 2023-04-24 DIAGNOSIS — M542 Cervicalgia: Secondary | ICD-10-CM

## 2023-04-24 DIAGNOSIS — M25511 Pain in right shoulder: Secondary | ICD-10-CM | POA: Diagnosis not present

## 2023-04-24 DIAGNOSIS — M41125 Adolescent idiopathic scoliosis, thoracolumbar region: Secondary | ICD-10-CM | POA: Diagnosis not present

## 2023-04-24 DIAGNOSIS — M9904 Segmental and somatic dysfunction of sacral region: Secondary | ICD-10-CM | POA: Diagnosis not present

## 2023-04-24 DIAGNOSIS — M47812 Spondylosis without myelopathy or radiculopathy, cervical region: Secondary | ICD-10-CM | POA: Diagnosis not present

## 2023-04-24 DIAGNOSIS — M419 Scoliosis, unspecified: Secondary | ICD-10-CM | POA: Diagnosis not present

## 2023-04-24 DIAGNOSIS — M9902 Segmental and somatic dysfunction of thoracic region: Secondary | ICD-10-CM | POA: Diagnosis not present

## 2023-04-24 DIAGNOSIS — M9901 Segmental and somatic dysfunction of cervical region: Secondary | ICD-10-CM

## 2023-04-24 DIAGNOSIS — M5033 Other cervical disc degeneration, cervicothoracic region: Secondary | ICD-10-CM | POA: Diagnosis not present

## 2023-04-24 DIAGNOSIS — G8929 Other chronic pain: Secondary | ICD-10-CM | POA: Diagnosis not present

## 2023-04-24 DIAGNOSIS — M9903 Segmental and somatic dysfunction of lumbar region: Secondary | ICD-10-CM

## 2023-04-24 DIAGNOSIS — M9908 Segmental and somatic dysfunction of rib cage: Secondary | ICD-10-CM | POA: Diagnosis not present

## 2023-04-24 NOTE — Assessment & Plan Note (Signed)
Patient does have scoliosis.  Still responding relatively well though to osteopathic manipulation.  Has had some more stress recently and likely contributing to some of the tightness patient not being quite as active.  Follow-up with me again in 6 to 8 weeks.

## 2023-04-24 NOTE — Assessment & Plan Note (Signed)
Chronic problem that has not responded extremely well to our other modalities including trigger point injections, as well as a acromioclavicular injection.  Continues to have some limitation in some range of motion and does have some signs and symptoms consistent with rotator cuff arthropathy.  Patient given exercises again.  Injection today, if continuing to have difficulty with hands imaging may be warranted.

## 2023-04-24 NOTE — Patient Instructions (Addendum)
Xray today Injection in shoulder See you again in 6 weeks

## 2023-04-25 ENCOUNTER — Encounter: Payer: Self-pay | Admitting: Family Medicine

## 2023-06-05 NOTE — Progress Notes (Unsigned)
[name] [address] [place] [address] Phone: [phone] Subjective:   Madison [name], am serving as a scribe for Dr. Antoine Primas.  I'm seeing this patient by the request  of:  Pincus Sanes, MD  CC: Back and neck pain follow-up  [id number]  Madison [name] is a 37 y.o. female coming in with complaint of back and neck pain. OMT 04/24/2023. Patient states that she has constant achiness in R scapula. Pain is slowly improvement.   Medications patient has been prescribed: Gabapentin, Vit D  Taking:         Reviewed prior external information including notes and imaging from previsou exam, outside providers and external EMR if available.   As well as notes that were available from care everywhere and other healthcare systems.  Past medical history, social, surgical and family history all reviewed in electronic medical record.  No pertanent information unless stated regarding to the chief complaint.   Past Medical History:  Diagnosis Date   Allergy    Asthma    Hx of migraines 01/012003   can tell when they come on, treats with excedrin migraine.   Scoliosis 10/24/1995   Vitamin D deficiency     Allergies  Allergen Reactions   Kiwi Extract Anaphylaxis   Prednisone     Tingling in hands and face   Meloxicam     heartburn     Review of Systems:  No headache, visual changes, nausea, vomiting, diarrhea, constipation, dizziness, abdominal pain, skin rash, fevers, chills, night sweats, weight loss, swollen lymph nodes, body aches, joint swelling, chest pain, shortness of breath, mood changes. POSITIVE muscle aches  Objective  Blood pressure 112/74, pulse 68, height 5\' 3"  (1.6 m), weight 138 lb (62.6 kg), SpO2 98%.   General: No apparent distress alert and oriented x3 mood and affect normal, dressed appropriately.  HEENT: Pupils equal, extraocular movements intact  Respiratory: Patient's speak in full sentences and does  not appear short of breath  Cardiovascular: No lower extremity edema, non tender, no erythema  Back exam does have some loss lordosis noted.  Some tenderness to palpation in the paraspinal musculature.  Patient does have significant tightness in the right parascapular area.  Scoliosis noted fairly significant.  Osteopathic findings  C2 flexed rotated and side bent right C\7 flexed rotated and side bent right T3 extended rotated and side bent right inhaled rib T9 extended rotated and side bent right L1 flexed rotated and side bent right Sacrum right on right       Assessment and Plan:  Right shoulder pain Continues to have the right shoulder pain follow-up.  I do think that some of it could be cervical radiculopathy.  Discussed with patient also stress is likely playing some type of a role.  Discussed different medications at length.  Patient did respond relatively well though to osteopathic manipulation as well.  Discussed other ergonomics.  Will continue exercises.  Has muscle relaxers if needed.  Follow-up again in 6 to 8 weeks otherwise.    Nonallopathic problems  Decision today to treat with OMT was based on Physical Exam  After verbal consent patient was treated with HVLA, ME, FPR techniques in cervical, rib, thoracic, lumbar, and sacral  areas  Patient tolerated the procedure well with improvement in symptoms  Patient given exercises, stretches and lifestyle modifications  See medications in patient instructions if given  Patient will follow up in 4-8 weeks     The  above documentation has been reviewed and is accurate and complete [name], DO         Note: This dictation was prepared with Dragon dictation along with smaller phrase technology. Any transcriptional errors that result from this process are unintentional.

## 2023-06-06 ENCOUNTER — Ambulatory Visit: Payer: 59 | Admitting: Dermatology

## 2023-06-06 ENCOUNTER — Other Ambulatory Visit (HOSPITAL_BASED_OUTPATIENT_CLINIC_OR_DEPARTMENT_OTHER): Payer: Self-pay

## 2023-06-06 ENCOUNTER — Encounter: Payer: Self-pay | Admitting: Dermatology

## 2023-06-06 VITALS — BP 105/69

## 2023-06-06 DIAGNOSIS — L821 Other seborrheic keratosis: Secondary | ICD-10-CM | POA: Diagnosis not present

## 2023-06-06 DIAGNOSIS — W908XXA Exposure to other nonionizing radiation, initial encounter: Secondary | ICD-10-CM

## 2023-06-06 DIAGNOSIS — D1801 Hemangioma of skin and subcutaneous tissue: Secondary | ICD-10-CM

## 2023-06-06 DIAGNOSIS — Z1283 Encounter for screening for malignant neoplasm of skin: Secondary | ICD-10-CM | POA: Diagnosis not present

## 2023-06-06 DIAGNOSIS — D229 Melanocytic nevi, unspecified: Secondary | ICD-10-CM

## 2023-06-06 DIAGNOSIS — Z808 Family history of malignant neoplasm of other organs or systems: Secondary | ICD-10-CM | POA: Diagnosis not present

## 2023-06-06 DIAGNOSIS — L578 Other skin changes due to chronic exposure to nonionizing radiation: Secondary | ICD-10-CM | POA: Diagnosis not present

## 2023-06-06 DIAGNOSIS — R21 Rash and other nonspecific skin eruption: Secondary | ICD-10-CM | POA: Diagnosis not present

## 2023-06-06 DIAGNOSIS — D239 Other benign neoplasm of skin, unspecified: Secondary | ICD-10-CM

## 2023-06-06 DIAGNOSIS — L814 Other melanin hyperpigmentation: Secondary | ICD-10-CM | POA: Diagnosis not present

## 2023-06-06 DIAGNOSIS — L309 Dermatitis, unspecified: Secondary | ICD-10-CM

## 2023-06-06 MED ORDER — TACROLIMUS 0.1 % EX OINT
TOPICAL_OINTMENT | Freq: Two times a day (BID) | CUTANEOUS | 0 refills | Status: DC
  Filled 2023-06-06: qty 30, 30d supply, fill #0

## 2023-06-06 NOTE — Patient Instructions (Addendum)
Dear Marchelle Folks,  Thank you for visiting our clinic today. Your dedication to maintaining your health and skincare is greatly appreciated. Here is a summary of the key instructions and recommendations from today's consultation:  - Sun Protection:   - Daily Use: Apply La Roche-Posay moisturizer with sunscreen to your face, neck, and chest.   - Outdoor Activities: Use Blue Lizard mineral sunscreen, which contains Zinc Oxide and Titanium Dioxide, for enhanced protection.  - Skin Care: Continue to monitor your moles and overall skin condition. No concerning signs were observed during your skin examination. Maintain your current skincare practices.  - Eczema Management:   - Application: Apply Tacrolimus Ointment to your elbows twice daily.   - Duration: Continue this treatment until the rash clears.   - Observation: Watch for any triggers, such as heat or environmental allergens.   - Action: Contact our office if symptoms persist or recur.   - Follow-Up: Please schedule a follow-up appointment in one year, or sooner if you notice any suspicious changes in your skin.  We are here to support you on your skincare journey and look forward to your next visit. Should you have any questions or concerns before then, please do not hesitate to reach out to our office.  Warm regards,  Dr. Langston Reusing, Dermatologist     Important Information  Due to recent changes in healthcare laws, you may see results of your pathology and/or laboratory studies on MyChart before the doctors have had a chance to review them. We understand that in some cases there may be results that are confusing or concerning to you. Please understand that not all results are received at the same time and often the doctors may need to interpret multiple results in order to provide you with the best plan of care or course of treatment. Therefore, we ask that you please give [place] 2 business days to thoroughly review all your results before  contacting the office for clarification. Should we see a critical lab result, you will be contacted sooner.   If You Need Anything After Your Visit  If you have any questions or concerns for your doctor, please call our main line at [phone] If no one answers, please leave a voicemail as directed and we will return your call as soon as possible. Messages left after 4 pm will be answered the following business day.   You may also send [place] a message via MyChart. We typically respond to MyChart messages within 1-2 business days.  For prescription refills, please ask your pharmacy to contact our office. Our fax number is [phone].  If you have an urgent issue when the clinic is closed that cannot wait until the next business day, you can page your doctor at the number below.    Please note that while we do our best to be available for urgent issues outside of office hours, we are not available 24/7.   If you have an urgent issue and are unable to reach [place], you may choose to seek medical care at your doctor's office, retail clinic, urgent care center, or emergency room.  If you have a medical emergency, please immediately call 911 or go to the emergency department. In the event of inclement weather, please call our main line at [phone] for an update on the status of any delays or closures.  Dermatology Medication Tips: Please keep the boxes that topical medications come in in order to help keep track of the instructions about where and  how to use these. Pharmacies typically print the medication instructions only on the boxes and not directly on the medication tubes.   If your medication is too expensive, please contact our office at [phone] or send [place] a message through MyChart.   We are unable to tell what your co-pay for medications will be in advance as this is different depending on your insurance coverage. However, we may be able to find a substitute medication at lower cost  or fill out paperwork to get insurance to cover a needed medication.   If a prior authorization is required to get your medication covered by your insurance company, please allow [place] 1-2 business days to complete this process.  Drug prices often vary depending on where the prescription is filled and some pharmacies may offer cheaper prices.  The website www.goodrx.com contains coupons for medications through different pharmacies. The prices here do not account for what the cost may be with help from insurance (it may be cheaper with your insurance), but the website can give you the price if you did not use any insurance.  - You can print the associated coupon and take it with your prescription to the pharmacy.  - You may also stop by our office during regular business hours and pick up a GoodRx coupon card.  - If you need your prescription sent electronically to a different pharmacy, notify our office through [facility] or by phone at [phone]    Skin Education :   I counseled the patient regarding the following: Sun screen (SPF 30 or greater) should be applied during peak UV exposure (between 10am and 2pm) and reapplied after exercise or swimming.  The ABCDEs of melanoma were reviewed with the patient, and the importance of monthly self-examination of moles was emphasized. Should any moles change in shape or color, or itch, bleed or burn, pt will contact our office for evaluation sooner then their interval appointment.  Plan: Sunscreen Recommendations I recommended a broad spectrum sunscreen with a SPF of 30 or higher. I explained that SPF 30 sunscreens block approximately 97 percent of the sun's harmful rays. Sunscreens should be applied at least 15 minutes prior to expected sun exposure and then every 2 hours after that as long as sun exposure continues. If swimming or exercising sunscreen should be reapplied every 45 minutes to an hour after getting wet or sweating. One ounce, or  the equivalent of a shot glass full of sunscreen, is adequate to protect the skin not covered by a bathing suit. I also recommended a lip balm with a sunscreen as well. Sun protective clothing can be used in lieu of sunscreen but must be worn the entire time you are exposed to the sun's rays. I counseled the patient regarding the following: Skin Care: Actinic Damage can improve with broad spectrum sunscreen, sun avoidance, bleaching creams, retinoids, chemical peels and laser. Expectations: Actinic Damage is photo-aging from excessive sun exposure. It manifests as unwanted pigmentation, wrinkles and textural thinning of the skin.  I recommended the following: Broad Spectrum Sunscreen SPF 30+ - SPF 30 daily to face, neck, chest and hands, reapplying every 3 hours when outside for long periods of time

## 2023-06-06 NOTE — Progress Notes (Signed)
   New Patient Visit   Subjective  Madison [name] is a 37 y.o. female who presents for the following: Skin Cancer Screening and Full Body Skin Exam. Her last skin exam was 2022. She had a moderate DN on the left thigh in 2023. Paternal grandfather with HX of Melanoma.   The patient presents for Total-Body Skin Exam (TBSE) for skin cancer screening and mole check. The patient has spots, moles and lesions to be evaluated, some may be new or changing and the patient may have concern these could be cancer.  She has red patches at the elbows x 3 weeks. She is using Eucerin Eczema cream which is helping.  The following portions of the chart were reviewed this encounter and updated as appropriate: medications, allergies, medical history  Review of Systems:  No other skin or systemic complaints except as noted in HPI or Assessment and Plan.  Objective  Well appearing patient in no apparent distress; mood and affect are within normal limits.  A full examination was performed including scalp, head, eyes, ears, nose, lips, neck, chest, axillae, abdomen, back, buttocks, bilateral upper extremities, bilateral lower extremities, hands, feet, fingers, toes, fingernails, and toenails. All findings within normal limits unless otherwise noted below.   Relevant physical exam findings are noted in the Assessment and Plan.    Assessment & Plan   SKIN CANCER SCREENING PERFORMED TODAY.  ACTINIC DAMAGE - Chronic condition, secondary to cumulative UV/sun exposure - diffuse scaly erythematous macules with underlying dyspigmentation - Recommend daily broad spectrum sunscreen SPF 30+ to sun-exposed areas, reapply every 2 hours as needed.  - Staying in the shade or wearing long sleeves, sun glasses (UVA+UVB protection) and wide brim hats (4-inch brim around the entire circumference of the hat) are also recommended for sun protection.  - Call for new or changing lesions.  LENTIGINES, SEBORRHEIC KERATOSES,  HEMANGIOMAS - Benign normal skin lesions - Benign-appearing - Call for any changes  MELANOCYTIC NEVI - Tan-brown and/or pink-flesh-colored symmetric macules and papules - Benign appearing on exam today - Observation - Call clinic for new or changing moles - Recommend daily use of broad spectrum spf 30+ sunscreen to sun-exposed areas.   DERMATOFIBROMA Exam: Firm pink/brown papulenodule with dimple sign. Treatment Plan: A dermatofibroma is a benign growth possibly related to trauma, such as an insect bite, cut from shaving, or inflamed acne-type bump.  Treatment options to remove include shave or excision with resulting scar and risk of recurrence.  Since benign-appearing and not bothersome, will observe for now.     Bilateral Elbow Rash, Possibly Eczema or Contact Dermatitis - Assessment: Rash observed on both elbows, possible eczema or contact dermatitis. - Plan: Prescribe Triamcinolone cream, to be applied twice daily for two weeks. Monitor for improvement and identify potential triggers. If rash recurs or does not improve, consider alternative diagnoses and treatments.    Return in about 1 year (around 06/05/2024) for TBSE.  Jaclynn Guarneri, CMA, am acting as scribe for [employer], DO.   Documentation: I have reviewed the above documentation for accuracy and completeness, and I agree with the above.  Langston Reusing, DO

## 2023-06-07 ENCOUNTER — Ambulatory Visit: Payer: 59 | Admitting: Family Medicine

## 2023-06-07 ENCOUNTER — Encounter: Payer: Self-pay | Admitting: Family Medicine

## 2023-06-07 VITALS — BP 112/74 | HR 68 | Ht 63.0 in | Wt 138.0 lb

## 2023-06-07 DIAGNOSIS — M25511 Pain in right shoulder: Secondary | ICD-10-CM | POA: Diagnosis not present

## 2023-06-07 DIAGNOSIS — M9904 Segmental and somatic dysfunction of sacral region: Secondary | ICD-10-CM | POA: Diagnosis not present

## 2023-06-07 DIAGNOSIS — M9903 Segmental and somatic dysfunction of lumbar region: Secondary | ICD-10-CM | POA: Diagnosis not present

## 2023-06-07 DIAGNOSIS — M9901 Segmental and somatic dysfunction of cervical region: Secondary | ICD-10-CM

## 2023-06-07 DIAGNOSIS — M9908 Segmental and somatic dysfunction of rib cage: Secondary | ICD-10-CM

## 2023-06-07 DIAGNOSIS — G8929 Other chronic pain: Secondary | ICD-10-CM

## 2023-06-07 DIAGNOSIS — M9902 Segmental and somatic dysfunction of thoracic region: Secondary | ICD-10-CM | POA: Diagnosis not present

## 2023-06-07 NOTE — Patient Instructions (Signed)
Read about Effexor Try to find time for yourself Will work on [employer] See you again in 6 weeks

## 2023-06-07 NOTE — Assessment & Plan Note (Signed)
Continues to have the right shoulder pain follow-up.  I do think that some of it could be cervical radiculopathy.  Discussed with patient also stress is likely playing some type of a role.  Discussed different medications at length.  Patient did respond relatively well though to osteopathic manipulation as well.  Discussed other ergonomics.  Will continue exercises.  Has muscle relaxers if needed.  Follow-up again in 6 to 8 weeks otherwise.

## 2023-06-15 ENCOUNTER — Other Ambulatory Visit (HOSPITAL_BASED_OUTPATIENT_CLINIC_OR_DEPARTMENT_OTHER): Payer: Self-pay

## 2023-07-18 NOTE — Progress Notes (Unsigned)
Madison [name] [address] [place] [address] Phone: [phone] Subjective:   INadine Counts, am serving as a scribe for Dr. Antoine Primas.  I'm seeing this patient by the request  of:  Pincus Sanes, MD  CC: Back and neck pain follow-up  [id number]  Madison [name] is a 37 y.o. female coming in with complaint of back and neck pain. OMT 06/07/2023. Also f/u for R shoulder pain. Patient states same per usual. No new concerns.  Medications patient has been prescribed: Vit D  Taking:      Patient's vitamin D was low 4 months ago and continues to be low for the last 2 years.  B12 was also significantly low   Reviewed prior external information including notes and imaging from previsou exam, outside providers and external EMR if available.   As well as notes that were available from care everywhere and other healthcare systems.  Past medical history, social, surgical and family history all reviewed in electronic medical record.  No pertanent information unless stated regarding to the chief complaint.   Past Medical History:  Diagnosis Date   Allergy    Asthma    Hx of migraines 01/012003   can tell when they come on, treats with excedrin migraine.   Scoliosis 10/24/1995   Vitamin D deficiency     Allergies  Allergen Reactions   Kiwi Extract Anaphylaxis   Prednisone     Tingling in hands and face   Meloxicam     heartburn     Review of Systems:  No headache, visual changes, nausea, vomiting, diarrhea, constipation, dizziness, abdominal pain, skin rash, fevers, chills, night sweats, weight loss, swollen lymph nodes, body aches, joint swelling, chest pain, shortness of breath, mood changes. POSITIVE muscle aches  Objective  Blood pressure 108/80, pulse 87, height 5\' 3"  (1.6 m), weight 136 lb (61.7 kg), SpO2 96%.   General: No apparent distress alert and oriented x3 mood and affect normal, dressed appropriately.  HEENT:  Pupils equal, extraocular movements intact  Respiratory: Patient's speak in full sentences and does not appear short of breath  Cardiovascular: No lower extremity edema, non tender, no erythema  MSK:  Back does have significant scoliosis noted.  Does have some tenderness to palpation in the paraspinal musculature.  Tightness with sidebending bilaterally.  Significant still tightness in the right scapular area with multiple trigger points  Osteopathic findings  C3 flexed rotated and side bent right C6 flexed rotated and side bent left T3 extended rotated and side bent right inhaled rib T8 extended rotated and side bent left L1 flexed rotated and side bent right Sacrum right on right     Assessment and Plan:  B12 deficiency B12 injection given today.  Will set up every 4 weeks at this time.  Scoliosis Responds relatively well to osteopathic manipulation.  Did more of the home exercises, icing regimen, which activities to do and which ones to avoid.  Patient does have a lot of stress, wants to avoid any type of medication changes at the moment.  Follow-up again in 6 to 8 weeks.    Nonallopathic problems  Decision today to treat with OMT was based on Physical Exam  After verbal consent patient was treated with HVLA, ME, FPR techniques in cervical, rib, thoracic, lumbar, and sacral  areas  Patient tolerated the procedure well with improvement in symptoms  Patient given exercises, stretches and lifestyle modifications  See medications in patient instructions if  given  Patient will follow up in 4-8 weeks    The above documentation has been reviewed and is accurate and complete [name], DO          Note: This dictation was prepared with Dragon dictation along with smaller phrase technology. Any transcriptional errors that result from this process are unintentional.

## 2023-07-19 ENCOUNTER — Other Ambulatory Visit: Payer: Self-pay

## 2023-07-19 ENCOUNTER — Encounter: Payer: Self-pay | Admitting: Family Medicine

## 2023-07-19 ENCOUNTER — Other Ambulatory Visit (HOSPITAL_BASED_OUTPATIENT_CLINIC_OR_DEPARTMENT_OTHER): Payer: Self-pay

## 2023-07-19 ENCOUNTER — Ambulatory Visit: Payer: 59 | Admitting: Family Medicine

## 2023-07-19 VITALS — BP 108/80 | HR 87 | Ht 63.0 in | Wt 136.0 lb

## 2023-07-19 DIAGNOSIS — M9904 Segmental and somatic dysfunction of sacral region: Secondary | ICD-10-CM | POA: Diagnosis not present

## 2023-07-19 DIAGNOSIS — E538 Deficiency of other specified B group vitamins: Secondary | ICD-10-CM | POA: Diagnosis not present

## 2023-07-19 DIAGNOSIS — M9902 Segmental and somatic dysfunction of thoracic region: Secondary | ICD-10-CM | POA: Diagnosis not present

## 2023-07-19 DIAGNOSIS — M41125 Adolescent idiopathic scoliosis, thoracolumbar region: Secondary | ICD-10-CM

## 2023-07-19 DIAGNOSIS — M9908 Segmental and somatic dysfunction of rib cage: Secondary | ICD-10-CM

## 2023-07-19 DIAGNOSIS — M9903 Segmental and somatic dysfunction of lumbar region: Secondary | ICD-10-CM

## 2023-07-19 DIAGNOSIS — M9901 Segmental and somatic dysfunction of cervical region: Secondary | ICD-10-CM | POA: Diagnosis not present

## 2023-07-19 MED ORDER — CYANOCOBALAMIN 1000 MCG/ML IJ SOLN
1000.0000 ug | Freq: Once | INTRAMUSCULAR | Status: AC
Start: 2023-07-19 — End: 2023-07-19
  Administered 2023-07-19: 1000 ug via INTRAMUSCULAR

## 2023-07-19 MED ORDER — VITAMIN D (ERGOCALCIFEROL) 1.25 MG (50000 UNIT) PO CAPS
50000.0000 [IU] | ORAL_CAPSULE | ORAL | 0 refills | Status: DC
  Filled 2023-07-19: qty 12, 84d supply, fill #0

## 2023-07-19 NOTE — Assessment & Plan Note (Signed)
Responds relatively well to osteopathic manipulation.  Did more of the home exercises, icing regimen, which activities to do and which ones to avoid.  Patient does have a lot of stress, wants to avoid any type of medication changes at the moment.  Follow-up again in 6 to 8 weeks.

## 2023-07-19 NOTE — Patient Instructions (Addendum)
B12 injection today: can do every 4 weeks Vit D refilled See me in 5 weeks

## 2023-07-19 NOTE — Assessment & Plan Note (Signed)
B12 injection given today.  Will set up every 4 weeks at this time.

## 2023-07-25 ENCOUNTER — Telehealth: Payer: 59 | Admitting: Physician Assistant

## 2023-07-25 ENCOUNTER — Other Ambulatory Visit (HOSPITAL_BASED_OUTPATIENT_CLINIC_OR_DEPARTMENT_OTHER): Payer: Self-pay

## 2023-07-25 DIAGNOSIS — B9689 Other specified bacterial agents as the cause of diseases classified elsewhere: Secondary | ICD-10-CM

## 2023-07-25 DIAGNOSIS — J019 Acute sinusitis, unspecified: Secondary | ICD-10-CM

## 2023-07-25 MED ORDER — AMOXICILLIN-POT CLAVULANATE 875-125 MG PO TABS
1.0000 | ORAL_TABLET | Freq: Two times a day (BID) | ORAL | 0 refills | Status: DC
Start: 2023-07-25 — End: 2023-07-26
  Filled 2023-07-25: qty 14, 7d supply, fill #0

## 2023-07-25 NOTE — Progress Notes (Signed)

## 2023-07-25 NOTE — Progress Notes (Signed)
I have spent 5 minutes in review of e-visit questionnaire, review and updating patient chart, medical decision making and response to patient.   Mia Milan Cody Jacklynn Dehaas, PA-C    

## 2023-07-26 ENCOUNTER — Other Ambulatory Visit (HOSPITAL_BASED_OUTPATIENT_CLINIC_OR_DEPARTMENT_OTHER): Payer: Self-pay

## 2023-07-26 MED ORDER — DOXYCYCLINE HYCLATE 100 MG PO TABS
100.0000 mg | ORAL_TABLET | Freq: Two times a day (BID) | ORAL | 0 refills | Status: DC
  Filled 2023-07-26: qty 14, 7d supply, fill #0

## 2023-07-26 NOTE — Addendum Note (Signed)
Addended by: Waldon Merl on: 07/26/2023 03:47 PM   Modules accepted: Orders

## 2023-08-01 ENCOUNTER — Encounter: Payer: Self-pay | Admitting: Internal Medicine

## 2023-08-21 NOTE — Progress Notes (Unsigned)
[name] [address] [place] [address] Phone: [phone] Subjective:   Madison [name], am serving as a scribe for Dr. Antoine [name].  I'm seeing this patient by the request  of:  Burns, Madison Mo, MD  CC: Patient continues to have discomfort.  [id number]  Madison [name] is a 37 y.o. female coming in with complaint of back and neck pain. OMT 07/09/2023. Patient states that she is no worse and no better than last visit. Would like B12 injection today. Has not noticed difference yet in taking it.   Medications patient has been prescribed: Vit D  Taking:         Reviewed prior external information including notes and imaging from previsou exam, outside providers and external EMR if available.   As well as notes that were available from care everywhere and other healthcare systems.  Past medical history, social, surgical and family history all reviewed in electronic medical record.  No pertanent information unless stated regarding to the chief complaint.   Past Medical History:  Diagnosis Date   Allergy    Asthma    Hx of migraines 01/012003   can tell when they come on, treats with excedrin migraine.   Scoliosis 10/24/1995   Vitamin D deficiency     Allergies  Allergen Reactions   Kiwi Extract Anaphylaxis   Prednisone     Tingling in hands and face   Meloxicam     heartburn     Review of Systems:  No headache, visual changes, nausea, vomiting, diarrhea, constipation, dizziness, abdominal pain, skin rash, fevers, chills, night sweats, weight loss, swollen lymph nodes, body aches, joint swelling, chest pain, shortness of breath, mood changes. POSITIVE muscle aches  Objective  Blood pressure 96/68, pulse 78, height 5\' 3"  (1.6 m), weight 139 lb (63 kg), SpO2 97%.   General: No apparent distress alert and oriented x3 mood and affect normal, dressed appropriately.  HEENT: Pupils equal, extraocular movements intact   Respiratory: Patient's speak in full sentences and does not appear short of breath  Cardiovascular: No lower extremity edema, non tender, no erythema  Significant scoliosis noted.  Tightness noted in the paraspinal musculature.  Seems to be more in the right parascapular area.  Osteopathic findings  C3. flexed rotated and side bent right C6 flexed rotated and side bent left T3 extended rotated and side bent right inhaled rib T9 extended rotated and side bent left L3 flexed rotated and side bent right Sacrum right on right       Assessment and Plan:  Scoliosis Significant scoliosis but is responding well to muscle energy techniques for osteopathic manipulation.  Patient does have some increasing stress noted with work as well as family and their health conditions.  Patient is trying to find time for herself and finds it difficult.  Discussed icing regimen and home exercises.  Have taken different medications and encouraged her to use that hydroxyzine more if needed.  Patient will follow-up with me again in 5 to 6 weeks.    Nonallopathic problems  Decision today to treat with OMT was based on Physical Exam  After verbal consent patient was treated with ME, FPR techniques in cervical, rib, thoracic, lumbar, and sacral  areas  Patient tolerated the procedure well with improvement in symptoms  Patient given exercises, stretches and lifestyle modifications  See medications in patient instructions if given  Patient will follow up in 6-8 weeks    The above documentation has  been reviewed and is accurate and complete [name], DO          Note: This dictation was prepared with Dragon dictation along with smaller phrase technology. Any transcriptional errors that result from this process are unintentional.

## 2023-08-23 ENCOUNTER — Encounter: Payer: Self-pay | Admitting: Family Medicine

## 2023-08-23 ENCOUNTER — Ambulatory Visit: Payer: 59 | Admitting: Family Medicine

## 2023-08-23 VITALS — BP 96/68 | HR 78 | Ht 63.0 in | Wt 139.0 lb

## 2023-08-23 DIAGNOSIS — M9901 Segmental and somatic dysfunction of cervical region: Secondary | ICD-10-CM

## 2023-08-23 DIAGNOSIS — M41125 Adolescent idiopathic scoliosis, thoracolumbar region: Secondary | ICD-10-CM | POA: Diagnosis not present

## 2023-08-23 DIAGNOSIS — E538 Deficiency of other specified B group vitamins: Secondary | ICD-10-CM

## 2023-08-23 DIAGNOSIS — M9902 Segmental and somatic dysfunction of thoracic region: Secondary | ICD-10-CM

## 2023-08-23 DIAGNOSIS — M9908 Segmental and somatic dysfunction of rib cage: Secondary | ICD-10-CM | POA: Diagnosis not present

## 2023-08-23 DIAGNOSIS — M9903 Segmental and somatic dysfunction of lumbar region: Secondary | ICD-10-CM

## 2023-08-23 DIAGNOSIS — M9904 Segmental and somatic dysfunction of sacral region: Secondary | ICD-10-CM

## 2023-08-23 MED ORDER — CYANOCOBALAMIN 1000 MCG/ML IJ SOLN
1000.0000 ug | Freq: Once | INTRAMUSCULAR | Status: AC
Start: 2023-08-23 — End: 2023-08-23
  Administered 2023-08-23: 1000 ug via INTRAMUSCULAR

## 2023-08-23 NOTE — Assessment & Plan Note (Signed)
Injection given again today.  Continue with the oral supplementation as well.

## 2023-08-23 NOTE — Patient Instructions (Addendum)
See me in 6-8 weeks and encouraged her to maybe use the hydroxyzine

## 2023-08-23 NOTE — Assessment & Plan Note (Signed)
Significant scoliosis but is responding well to muscle energy techniques for osteopathic manipulation.  Patient does have some increasing stress noted with work as well as family and their health conditions.  Patient is trying to find time for herself and finds it difficult.  Discussed icing regimen and home exercises.  Have taken different medications and encouraged her to use that hydroxyzine more if needed.  Patient will follow-up with me again in 5 to 6 weeks.

## 2023-08-24 ENCOUNTER — Other Ambulatory Visit (HOSPITAL_BASED_OUTPATIENT_CLINIC_OR_DEPARTMENT_OTHER): Payer: Self-pay

## 2023-08-24 MED ORDER — COMIRNATY 30 MCG/0.3ML IM SUSY
PREFILLED_SYRINGE | INTRAMUSCULAR | 0 refills | Status: DC
  Filled 2023-08-24: qty 0.3, 1d supply, fill #0

## 2023-10-04 NOTE — Progress Notes (Signed)
Subjective:    Patient ID: Madison [name], female    DOB: 08/07/1986, 37 y.o.   MRN: [id number]      HPI Madison [name] is here for  Chief Complaint  Patient presents with   Fatigue    Fatigue (started after Thanksgiving and hasn't regained energy;, ear pain, struggling to breath when walking; sore teeth, coughing and vomiting at the same time and body hurts all over. Low grade fever last night (100.2)     Her fatigue started after thanksgiving.  She slept for 2 days just after Thanksgiving and initially assumed it was just from the holiday and hosting..  Never regained her energy.  She has been able to function, but has just been significantly more tired than usual.  She denies any other symptoms associated with the fatigue   Tuesday, 3 days ago, felt like she was hit by a truck-she states swollen glands, sore throat, started coughing up green mucus, sinus congestion, ear pain, PND and noticed it took effort to breath, but no real shortness of breath.  She has had some headaches and dizziness at 1 point.  She has had some low-grade fevers.  Taking Dayquil, nyquil, tylenol, mucinex   Medications and allergies reviewed with patient and updated if appropriate.  Current Outpatient Medications on File Prior to Visit  Medication Sig Dispense Refill   albuterol (VENTOLIN HFA) 108 (90 Base) MCG/ACT inhaler Inhale 2 puffs into the lungs every 4 (four) hours as needed for wheezing or shortness of breath. 8.5 g 2   doxycycline (VIBRA-TABS) 100 MG tablet Take 1 tablet (100 mg total) by mouth 2 (two) times daily. 14 tablet 0   fexofenadine (ALLEGRA) 30 MG tablet Take 30 mg by mouth daily.      hydrOXYzine (ATARAX) 10 MG tablet Take 1 tablet (10 mg total) by mouth 3 (three) times daily as needed. 270 tablet 0   ibuprofen (ADVIL) 600 MG tablet Take 1 tablet (600 mg total) by mouth 3 (three) times daily. 90 tablet 0   Multiple Vitamin (MULTIVITAMIN) capsule Take 1 capsule by mouth daily.      Probiotic Product (PROBIOTIC PO) Take by mouth.     Spacer/Aero-Holding Chambers (AEROCHAMBER PLUS WITH MASK) inhaler Use as directed. 1 each 0   tacrolimus (PROTOPIC) 0.1 % ointment Apply to affected areas twice daily for flares 100 g 0   tiZANidine (ZANAFLEX) 4 MG tablet Take 1 tablet (4 mg total) by mouth at bedtime. 30 tablet 0   Vitamin D, Ergocalciferol, (DRISDOL) 1.25 MG (50000 UNIT) CAPS capsule Take 1 capsule (50,000 Units total) by mouth every 7 (seven) days. 12 capsule 0   Vitamin D, Ergocalciferol, (DRISDOL) 1.25 MG (50000 UNIT) CAPS capsule Take 1 capsule (50,000 Units total) by mouth every 7 (seven) days. 12 capsule 0   No current facility-administered medications on file prior to visit.    Review of Systems  Constitutional:  Positive for appetite change (decreased), chills, fatigue and fever (lowe grade).  HENT:  Positive for ear pain, postnasal drip and sore throat. Negative for congestion, sinus pressure and sinus pain.        Teeth ache.  No change in taste/smell  Respiratory:  Positive for cough (green thick mucus). Negative for chest tightness, shortness of breath (effort to breath) and wheezing.   Gastrointestinal:  Positive for vomiting (with cough). Negative for diarrhea and nausea.  Musculoskeletal:  Positive for myalgias.  Neurological:  Positive for dizziness (a few days ago) and headaches. Negative  for light-headedness.       Objective:   Vitals:   10/05/23 0750  BP: 102/74  Pulse: (!) 112  Temp: 99.4 F (37.4 C)  SpO2: 97%   BP Readings from Last 3 Encounters:  10/05/23 102/74  08/23/23 96/68  07/19/23 108/80   Wt Readings from Last 3 Encounters:  10/05/23 138 lb (62.6 kg)  08/23/23 139 lb (63 kg)  07/19/23 136 lb (61.7 kg)   Body mass index is 24.45 kg/m.    Physical Exam Constitutional:      General: She is not in acute distress.    Appearance: Normal appearance. She is not ill-appearing.  HENT:     Head: Normocephalic and atraumatic.      Right Ear: Tympanic membrane, ear canal and external ear normal.     Left Ear: Tympanic membrane, ear canal and external ear normal.     Mouth/Throat:     Mouth: Mucous membranes are moist.     Pharynx: No oropharyngeal exudate or posterior oropharyngeal erythema.  Eyes:     Conjunctiva/sclera: Conjunctivae normal.  Cardiovascular:     Rate and Rhythm: Normal rate and regular rhythm.  Pulmonary:     Effort: Pulmonary effort is normal. No respiratory distress.     Breath sounds: Normal breath sounds. No wheezing or rales.  Abdominal:     General: There is no distension.     Palpations: Abdomen is soft.     Tenderness: There is no abdominal tenderness. There is no guarding or rebound.     Comments: No splenomegaly  Musculoskeletal:     Cervical back: Neck supple. No tenderness.     Right lower leg: No edema.     Left lower leg: No edema.  Lymphadenopathy:     Cervical: Cervical adenopathy (Bilateral anterior and posterior cervical lymphadenopathy) present.  Skin:    General: Skin is warm and dry.     Findings: No rash.  Neurological:     Mental Status: She is alert.        DG Chest 2 View CLINICAL DATA:  Cough, fever.  EXAM: CHEST - 2 VIEW  COMPARISON:  None Available.  FINDINGS: The heart size and mediastinal contours are within normal limits. Both lungs are clear. Thoracic scoliosis and Harrington rods are noted.  IMPRESSION: No active cardiopulmonary disease.  Electronically Signed   By: Lupita Raider M.D.   On: 10/05/2023 08:28      Assessment & Plan:    See Problem List for Assessment and Plan of chronic medical problems.

## 2023-10-05 ENCOUNTER — Ambulatory Visit (INDEPENDENT_AMBULATORY_CARE_PROVIDER_SITE_OTHER): Payer: 59

## 2023-10-05 ENCOUNTER — Ambulatory Visit (INDEPENDENT_AMBULATORY_CARE_PROVIDER_SITE_OTHER): Payer: 59 | Admitting: Internal Medicine

## 2023-10-05 ENCOUNTER — Other Ambulatory Visit (HOSPITAL_BASED_OUTPATIENT_CLINIC_OR_DEPARTMENT_OTHER): Payer: Self-pay

## 2023-10-05 VITALS — BP 102/74 | HR 112 | Temp 99.4°F | Ht 63.0 in | Wt 138.0 lb

## 2023-10-05 DIAGNOSIS — J029 Acute pharyngitis, unspecified: Secondary | ICD-10-CM | POA: Diagnosis not present

## 2023-10-05 DIAGNOSIS — R5383 Other fatigue: Secondary | ICD-10-CM

## 2023-10-05 DIAGNOSIS — R509 Fever, unspecified: Secondary | ICD-10-CM | POA: Diagnosis not present

## 2023-10-05 DIAGNOSIS — R059 Cough, unspecified: Secondary | ICD-10-CM | POA: Diagnosis not present

## 2023-10-05 DIAGNOSIS — J069 Acute upper respiratory infection, unspecified: Secondary | ICD-10-CM | POA: Diagnosis not present

## 2023-10-05 LAB — COMPREHENSIVE METABOLIC PANEL
ALT: 20 U/L (ref 0–35)
AST: 17 U/L (ref 0–37)
Albumin: 4.3 g/dL (ref 3.5–5.2)
Alkaline Phosphatase: 49 U/L (ref 39–117)
BUN: 6 mg/dL (ref 6–23)
CO2: 28 meq/L (ref 19–32)
Calcium: 9.4 mg/dL (ref 8.4–10.5)
Chloride: 103 meq/L (ref 96–112)
Creatinine, Ser: 0.7 mg/dL (ref 0.40–1.20)
GFR: 109.54 mL/min (ref >60.00)
Glucose, Bld: 94 mg/dL (ref 70–99)
Potassium: 3.8 meq/L (ref 3.5–5.1)
Sodium: 141 meq/L (ref 135–145)
Total Bilirubin: 0.5 mg/dL (ref 0.2–1.2)
Total Protein: 7.3 g/dL (ref 6.0–8.3)

## 2023-10-05 LAB — CBC WITH DIFFERENTIAL/PLATELET
Basophils Absolute: 0 10*3/uL (ref 0.0–0.1)
Basophils Relative: 0.1 % (ref 0.0–3.0)
Eosinophils Absolute: 0.2 10*3/uL (ref 0.0–0.7)
Eosinophils Relative: 1.3 % (ref 0.0–5.0)
HCT: 41.3 % (ref 36.0–46.0)
Hemoglobin: 13.7 g/dL (ref 12.0–15.0)
Lymphocytes Relative: 9.2 % — ABNORMAL LOW (ref 12.0–46.0)
Lymphs Abs: 1.6 10*3/uL (ref 0.7–4.0)
MCHC: 33.1 g/dL (ref 30.0–36.0)
MCV: 93.6 fL (ref 78.0–100.0)
Monocytes Absolute: 1.4 10*3/uL — ABNORMAL HIGH (ref 0.1–1.0)
Monocytes Relative: 7.9 % (ref 3.0–12.0)
Neutro Abs: 14.3 10*3/uL — ABNORMAL HIGH (ref 1.4–7.7)
Neutrophils Relative %: 81.5 % — ABNORMAL HIGH (ref 43.0–77.0)
Platelets: 244 10*3/uL (ref 150.0–400.0)
RBC: 4.41 Mil/uL (ref 3.87–5.11)
RDW: 13.1 % (ref 11.5–15.5)
WBC: 17.5 10*3/uL — ABNORMAL HIGH (ref 4.0–10.5)

## 2023-10-05 LAB — TSH: TSH: 0.54 u[IU]/mL (ref 0.35–5.50)

## 2023-10-05 MED ORDER — ALBUTEROL SULFATE HFA 108 (90 BASE) MCG/ACT IN AERS
2.0000 | INHALATION_SPRAY | RESPIRATORY_TRACT | 2 refills | Status: DC | PRN
  Filled 2023-10-05: qty 8.5, 17d supply, fill #0

## 2023-10-05 MED ORDER — AZITHROMYCIN 250 MG PO TABS
ORAL_TABLET | ORAL | 0 refills | Status: DC
  Filled 2023-10-05: qty 6, 5d supply, fill #0

## 2023-10-05 NOTE — Patient Instructions (Addendum)
      Blood work was ordered.   Chest xray ordered.    Medications changes include :   zpak - hold until test results are back, albuterol      Return if symptoms worsen or fail to improve.

## 2023-10-05 NOTE — Assessment & Plan Note (Signed)
Acute Started couple weeks ago after Thanksgiving and has been persistent since then No associated cold symptoms Check TSH, CBC, CMP, mono

## 2023-10-05 NOTE — Assessment & Plan Note (Signed)
Acute Started 3 days ago Having productive cough, sore throat, low-grade fevers, fatigue Chest x-ray negative for pneumonia Rapid COVID and rapid flu test negative Unlikely strep given constellation of symptoms Possibly bacterial Will check mono since there has been mono going around and it might explain some of her fatigue Given severity of symptoms if mono is negative she will go ahead and start Z-Pak Continue OTC cold medications, rest, fluids and call if no improvement

## 2023-10-05 NOTE — Assessment & Plan Note (Signed)
Acute Associated with several other cold symptoms-started 3 days ago Unlikely strep given constellation of symptoms Will check mono, CBC, TSH, CMP Given other symptoms COVID and flu test were checked-both negative Chest x-ray today negative for pneumonia We will start Z-Pak as long as monotest is negative

## 2023-10-06 ENCOUNTER — Encounter: Payer: Self-pay | Admitting: Internal Medicine

## 2023-10-06 LAB — MONONUCLEOSIS SCREEN: Heterophile, Mono Screen: POSITIVE — AB

## 2023-10-19 NOTE — Progress Notes (Unsigned)
[name] [address] [place] [address] Phone: [phone] Subjective:   Madison [name], am serving as a scribe for Dr. Antoine Primas.  I'm seeing this patient by the request  of:  Pincus Sanes, MD  CC: Multiple joint pain follow-up  [id number]  Madison [name] is a 37 y.o. female coming in with complaint of back and neck pain. OMT 08/23/2023.  Known severe scoliosis.  patient states same per usual. Feels like R hip is pulling forward. Thinks she may have had mono last month.  Medications patient has been prescribed: Vit D  Taking: Should we recheck B12, vitamin D, and CBC?        Reviewed prior external information including notes and imaging from previsou exam, outside providers and external EMR if available.   As well as notes that were available from care everywhere and other healthcare systems.  Past medical history, social, surgical and family history all reviewed in electronic medical record.  No pertanent information unless stated regarding to the chief complaint.   Past Medical History:  Diagnosis Date   Allergy    Asthma    Hx of migraines 01/012003   can tell when they come on, treats with excedrin migraine.   Scoliosis 10/24/1995   Vitamin D deficiency     Allergies  Allergen Reactions   Kiwi Extract Anaphylaxis   Prednisone     Tingling in hands and face   Meloxicam     heartburn     Review of Systems:  No headache, visual changes, nausea, vomiting, diarrhea, constipation, dizziness, abdominal pain, skin rash, fevers, chills, night sweats, weight loss, swollen lymph nodes, body aches, joint swelling, chest pain, shortness of breath, mood changes. POSITIVE muscle aches  Objective  Blood pressure 102/70, pulse 89, height 5\' 3"  (1.6 m), weight 137 lb (62.1 kg), SpO2 96%.   General: No apparent distress alert and oriented x3 mood and affect normal, dressed appropriately.  HEENT: Pupils equal,  extraocular movements intact  Respiratory: Patient's speak in full sentences and does not appear short of breath  Cardiovascular: No lower extremity edema, non tender, no erythema  MSK:  Back does have loss of lordosis scoliosis noted.  Tightness still noted in the right parascapular area.  Osteopathic findings  C2 flexed rotated and side bent right C5 flexed rotated and side bent right T3 extended rotated and side bent right inhaled rib T7 extended rotated and side bent left L2 flexed rotated and side bent right Sacrum right on right     Assessment and Plan:  Scoliosis Some exacerbation with patient not being able to be as active secondary post mono.  Still having difficulty with getting her heart rate to be elevated with certain activities.  Concern for potential atypical pneumonia.  Discussed the antibiotics including the azithromycin 500 mg for 3 days.  Discussed increasing activity as tolerated.  Follow-up with me again in 6 to 8 weeks otherwise    Nonallopathic problems  Decision today to treat with OMT was based on Physical Exam  After verbal consent patient was treated with HVLA, ME, FPR techniques in cervical, rib, thoracic, lumbar, and sacral  areas  Patient tolerated the procedure well with improvement in symptoms  Patient given exercises, stretches and lifestyle modifications  See medications in patient instructions if given  Patient will follow up in 4-8 weeks     The above documentation has been reviewed and is accurate and complete [name], DO  Note: This dictation was prepared with Dragon dictation along with smaller phrase technology. Any transcriptional errors that result from this process are unintentional.

## 2023-10-22 ENCOUNTER — Encounter: Payer: Self-pay | Admitting: Family Medicine

## 2023-10-22 ENCOUNTER — Ambulatory Visit: Payer: 59 | Admitting: Family Medicine

## 2023-10-22 ENCOUNTER — Other Ambulatory Visit: Payer: Self-pay

## 2023-10-22 ENCOUNTER — Other Ambulatory Visit (HOSPITAL_BASED_OUTPATIENT_CLINIC_OR_DEPARTMENT_OTHER): Payer: Self-pay

## 2023-10-22 VITALS — BP 102/70 | HR 89 | Ht 63.0 in | Wt 137.0 lb

## 2023-10-22 DIAGNOSIS — M9902 Segmental and somatic dysfunction of thoracic region: Secondary | ICD-10-CM

## 2023-10-22 DIAGNOSIS — M41125 Adolescent idiopathic scoliosis, thoracolumbar region: Secondary | ICD-10-CM

## 2023-10-22 DIAGNOSIS — M9908 Segmental and somatic dysfunction of rib cage: Secondary | ICD-10-CM | POA: Diagnosis not present

## 2023-10-22 DIAGNOSIS — M9901 Segmental and somatic dysfunction of cervical region: Secondary | ICD-10-CM | POA: Diagnosis not present

## 2023-10-22 DIAGNOSIS — E538 Deficiency of other specified B group vitamins: Secondary | ICD-10-CM | POA: Diagnosis not present

## 2023-10-22 DIAGNOSIS — J069 Acute upper respiratory infection, unspecified: Secondary | ICD-10-CM | POA: Diagnosis not present

## 2023-10-22 DIAGNOSIS — M9904 Segmental and somatic dysfunction of sacral region: Secondary | ICD-10-CM

## 2023-10-22 DIAGNOSIS — M2550 Pain in unspecified joint: Secondary | ICD-10-CM

## 2023-10-22 DIAGNOSIS — M9903 Segmental and somatic dysfunction of lumbar region: Secondary | ICD-10-CM

## 2023-10-22 LAB — COMPREHENSIVE METABOLIC PANEL
ALT: 14 U/L (ref 0–35)
AST: 15 U/L (ref 0–37)
Albumin: 4.2 g/dL (ref 3.5–5.2)
Alkaline Phosphatase: 46 U/L (ref 39–117)
BUN: 11 mg/dL (ref 6–23)
CO2: 27 meq/L (ref 19–32)
Calcium: 9.5 mg/dL (ref 8.4–10.5)
Chloride: 104 meq/L (ref 96–112)
Creatinine, Ser: 0.64 mg/dL (ref 0.40–1.20)
GFR: 111.9 mL/min (ref >60.00)
Glucose, Bld: 84 mg/dL (ref 70–99)
Potassium: 4.4 meq/L (ref 3.5–5.1)
Sodium: 139 meq/L (ref 135–145)
Total Bilirubin: 0.4 mg/dL (ref 0.2–1.2)
Total Protein: 7.1 g/dL (ref 6.0–8.3)

## 2023-10-22 LAB — VITAMIN B12: Vitamin B-12: 535 pg/mL (ref 211–911)

## 2023-10-22 LAB — CBC WITH DIFFERENTIAL/PLATELET
Basophils Absolute: 0 10*3/uL (ref 0.0–0.1)
Basophils Relative: 0.3 % (ref 0.0–3.0)
Eosinophils Absolute: 0.3 10*3/uL (ref 0.0–0.7)
Eosinophils Relative: 3.7 % (ref 0.0–5.0)
HCT: 40.9 % (ref 36.0–46.0)
Hemoglobin: 13.6 g/dL (ref 12.0–15.0)
Lymphocytes Relative: 20.8 % (ref 12.0–46.0)
Lymphs Abs: 1.9 10*3/uL (ref 0.7–4.0)
MCHC: 33.2 g/dL (ref 30.0–36.0)
MCV: 92.6 fL (ref 78.0–100.0)
Monocytes Absolute: 0.8 10*3/uL (ref 0.1–1.0)
Monocytes Relative: 8.7 % (ref 3.0–12.0)
Neutro Abs: 6 10*3/uL (ref 1.4–7.7)
Neutrophils Relative %: 66.5 % (ref 43.0–77.0)
Platelets: 296 10*3/uL (ref 150.0–400.0)
RBC: 4.42 Mil/uL (ref 3.87–5.11)
RDW: 13.3 % (ref 11.5–15.5)
WBC: 9 10*3/uL (ref 4.0–10.5)

## 2023-10-22 LAB — TSH: TSH: 0.82 u[IU]/mL (ref 0.35–5.50)

## 2023-10-22 LAB — VITAMIN D 25 HYDROXY (VIT D DEFICIENCY, FRACTURES): VITD: 41.42 ng/mL (ref 30.00–100.00)

## 2023-10-22 MED ORDER — VITAMIN D (ERGOCALCIFEROL) 1.25 MG (50000 UNIT) PO CAPS
50000.0000 [IU] | ORAL_CAPSULE | ORAL | 0 refills | Status: DC
  Filled 2023-10-22: qty 12, 84d supply, fill #0

## 2023-10-22 NOTE — Assessment & Plan Note (Signed)
Some exacerbation with patient not being able to be as active secondary post mono.  Still having difficulty with getting her heart rate to be elevated with certain activities.  Concern for potential atypical pneumonia.  Discussed the antibiotics including the azithromycin 500 mg for 3 days.  Discussed increasing activity as tolerated.  Follow-up with me again in 6 to 8 weeks otherwise

## 2023-10-22 NOTE — Assessment & Plan Note (Signed)
Recheck B12 level 

## 2023-10-22 NOTE — Assessment & Plan Note (Signed)
Will recheck a CBC secondary to the elevation in the white blood cells.

## 2023-10-22 NOTE — Patient Instructions (Addendum)
Labs today See you again in 6 weeks

## 2023-11-22 ENCOUNTER — Other Ambulatory Visit (HOSPITAL_BASED_OUTPATIENT_CLINIC_OR_DEPARTMENT_OTHER): Payer: Self-pay

## 2023-11-29 NOTE — Progress Notes (Signed)
 [facility] [address] [place] [address] Phone: [phone] Subjective:   IBryan Caprio, am serving as a scribe for Dr. Ronnell Coins.  I'm seeing this patient by the request  of:  Colene Dauphin, MD  CC: Back and neck pain follow-up  [id number]  Madison [name] is a 38 y.o. female coming in with complaint of back and neck pain. OMT 10/22/2023. Patient states same per usual. No new symptoms. Pain level about the same.  Medications patient has been prescribed: Vit D  Taking:  Laboratory workup at last follow-up did show the patient did have a low vitamin D  again.  Restarted to once weekly vitamin D  again.  Was to continue over-the-counter B12.       Reviewed prior external information including notes and imaging from previsou exam, outside providers and external EMR if available.   As well as notes that were available from care everywhere and other healthcare systems.  Past medical history, social, surgical and family history all reviewed in electronic medical record.  No pertanent information unless stated regarding to the chief complaint.   Past Medical History:  Diagnosis Date   Allergy    Asthma    Hx of migraines 01/012003   can tell when they come on, treats with excedrin migraine.   Scoliosis 10/24/1995   Vitamin D  deficiency     Allergies  Allergen Reactions   Kiwi Extract Anaphylaxis   Prednisone      Tingling in hands and face   Meloxicam      heartburn     Review of Systems:  No headache, visual changes, nausea, vomiting, diarrhea, constipation, dizziness, abdominal pain, skin rash, fevers, chills, night sweats, weight loss, swollen lymph nodes, body aches, joint swelling, chest pain, shortness of breath, mood changes. POSITIVE muscle aches  Objective  Blood pressure 102/68, pulse 75, height 5\' 3"  (1.6 m), weight 140 lb (63.5 kg), SpO2 98%.   General: No apparent distress alert and oriented x3 mood and  affect normal, dressed appropriately.  HEENT: Pupils equal, extraocular movements intact  Respiratory: Patient's speak in full sentences and does not appear short of breath  Cardiovascular: No lower extremity edema, non tender, no erythema  Gait MSK:  Back scoliosis noted.  Tightness in the neck noted seems to be more in the right parascapular area.  Likely no significant trigger points are noted today.  Still has some limited sidebending noted.  Osteopathic findings  C2 flexed rotated and side bent right C6 flexed rotated and side bent left T3 extended rotated and side bent right inhaled rib T9 extended rotated and side bent left L2 flexed rotated and side bent right Sacrum right on right       Assessment and Plan:  Scoliosis Scoliosis with some exacerbation of some increasing in stress that I do think causes more tightness noted.  Patient has been doing more work and having to travel some.  Also has some different stress from family that can be contributing as well.  Discussed which activities to do and which ones to avoid.  Increase activity slowly.  Follow-up again in 6 to 8 weeks otherwise.    Nonallopathic problems  Decision today to treat with OMT was based on Physical Exam  After verbal consent patient was treated with HVLA, ME, FPR techniques in cervical, rib, thoracic, lumbar, and sacral  areas  Patient tolerated the procedure well with improvement in symptoms  Patient given exercises, stretches and lifestyle modifications  See medications in patient instructions if given  Patient will follow up in 4-8 weeks     The above documentation has been reviewed and is accurate and complete Madison Abbey M Houda Brau, DO         Note: This dictation was prepared with Dragon dictation along with smaller phrase technology. Any transcriptional errors that result from this process are unintentional.

## 2023-12-03 ENCOUNTER — Ambulatory Visit: Payer: Commercial Managed Care - PPO | Admitting: Family Medicine

## 2023-12-03 ENCOUNTER — Encounter: Payer: Self-pay | Admitting: Family Medicine

## 2023-12-03 VITALS — BP 102/68 | HR 75 | Ht 63.0 in | Wt 140.0 lb

## 2023-12-03 DIAGNOSIS — M9901 Segmental and somatic dysfunction of cervical region: Secondary | ICD-10-CM

## 2023-12-03 DIAGNOSIS — M9904 Segmental and somatic dysfunction of sacral region: Secondary | ICD-10-CM | POA: Diagnosis not present

## 2023-12-03 DIAGNOSIS — M9903 Segmental and somatic dysfunction of lumbar region: Secondary | ICD-10-CM

## 2023-12-03 DIAGNOSIS — M9908 Segmental and somatic dysfunction of rib cage: Secondary | ICD-10-CM

## 2023-12-03 DIAGNOSIS — M9902 Segmental and somatic dysfunction of thoracic region: Secondary | ICD-10-CM | POA: Diagnosis not present

## 2023-12-03 DIAGNOSIS — M41125 Adolescent idiopathic scoliosis, thoracolumbar region: Secondary | ICD-10-CM | POA: Diagnosis not present

## 2023-12-03 NOTE — Patient Instructions (Signed)
See you again in 4 weeks  

## 2023-12-03 NOTE — Assessment & Plan Note (Signed)
 Scoliosis with some exacerbation of some increasing in stress that I do think causes more tightness noted.  Patient has been doing more work and having to travel some.  Also has some different stress from family that can be contributing as well.  Discussed which activities to do and which ones to avoid.  Increase activity slowly.  Follow-up again in 6 to 8 weeks otherwise.

## 2023-12-21 ENCOUNTER — Encounter: Payer: Self-pay | Admitting: Family Medicine

## 2023-12-21 ENCOUNTER — Other Ambulatory Visit (HOSPITAL_BASED_OUTPATIENT_CLINIC_OR_DEPARTMENT_OTHER): Payer: Self-pay

## 2023-12-21 MED ORDER — TIZANIDINE HCL 4 MG PO TABS
4.0000 mg | ORAL_TABLET | Freq: Every day | ORAL | 0 refills | Status: DC
  Filled 2023-12-21: qty 30, 30d supply, fill #0

## 2023-12-21 MED ORDER — PREDNISONE 20 MG PO TABS
20.0000 mg | ORAL_TABLET | Freq: Every day | ORAL | 1 refills | Status: DC
  Filled 2023-12-21: qty 10, 10d supply, fill #0

## 2023-12-28 NOTE — Progress Notes (Signed)
 [name] [address] [place] [address] Phone: [phone] Subjective:   Madison [name], am serving as a scribe for Dr. Antoine Primas.  I'm seeing this patient by the request  of:  Pincus Sanes, MD  CC: Back and neck pain follow-up  [id number]  Rashunda [name] is a 38 y.o. female coming in with complaint of back and neck pain. OMT 12/03/2023. Patient states she has increase in upper back pain between scapula since Thursday. Lower back pain is radiating into the R hip for past 2 weeks. Did travel. Was taking mm relaxer and prednisone but stopped taking both.   Medications patient has been prescribed: Zanaflex, Vit D  Taking: Yes         Reviewed prior external information including notes and imaging from previsou exam, outside providers and external EMR if available.   As well as notes that were available from care everywhere and other healthcare systems.  Past medical history, social, surgical and family history all reviewed in electronic medical record.  No pertanent information unless stated regarding to the chief complaint.   Past Medical History:  Diagnosis Date   Allergy    Asthma    Hx of migraines 01/012003   can tell when they come on, treats with excedrin migraine.   Scoliosis 10/24/1995   Vitamin D deficiency     Allergies  Allergen Reactions   Kiwi Extract Anaphylaxis   Prednisone     Tingling in hands and face   Meloxicam     heartburn     Review of Systems:  No headache, visual changes, nausea, vomiting, diarrhea, constipation, dizziness, abdominal pain, skin rash, fevers, chills, night sweats, weight loss, swollen lymph nodes, body aches, joint swelling, chest pain, shortness of breath, mood changes. POSITIVE muscle aches  Objective  Blood pressure 106/88, pulse 96, height 5\' 3"  (1.6 m), weight 137 lb (62.1 kg), SpO2 99%.   General: No apparent distress alert and oriented x3 mood and affect  normal, dressed appropriately.  HEENT: Pupils equal, extraocular movements intact  Respiratory: Patient's speak in full sentences and does not appear short of breath  Cardiovascular: No lower extremity edema, non tender, no erythema  Gait MSK:  Back significant scoliosis noted.  The patient does have severe tenderness to palpation in the right shoulder region.  Seems to be fairly severe overall.  More than what would be patient's baseline.  Multiple trigger points noted in the parascapular area.  Osteopathic findings  C2 flexed rotated and side bent right C6 flexed rotated and side bent left T3 extended rotated and side bent right inhaled rib T6 extended rotated and side bent left L2 flexed rotated and side bent right L3 flexed rotated and side bent left Sacrum right on right   After verbal consent patient was prepped with alcohol swab and with a 25-gauge half inch needle injected into 4 distinct trigger points in the trapezius, rhomboid, latissimus dorsi and levator scapula.  Total of 3 cc of 0.5% Marcaine and 1 cc of Kenalog 40 mg/mL used.  Minimal blood loss.  Band-Aid placed.  Postinjection instructions given    Assessment and Plan:  Trigger point of right shoulder region Chronic, with worsening symptoms.  Has had some mild stress.  Discussed icing regimen and home exercises, which activities to do and which ones to avoid.  Increase activity slowly.  Does have the underlying scoliosis will need to continue to monitor.  Follow-up with me again in  6 to 8 weeks  Scoliosis Discussed with patient about posture and ergonomics, which activities to do and which ones to avoid.  Increase activity slowly.  Discussed icing regimen.  Discussed ergonomics.  Has been traveling and likely this was a more of the exacerbation than anything else at the moment.  Follow-up again in 6 to 8 weeks    Nonallopathic problems  Decision today to treat with OMT was based on Physical Exam  After verbal  consent patient was treated with HVLA, ME, FPR techniques in cervical, rib, thoracic, lumbar, and sacral  areas  Patient tolerated the procedure well with improvement in symptoms  Patient given exercises, stretches and lifestyle modifications  See medications in patient instructions if given  Patient will follow up in 4-8 weeks    The above documentation has been reviewed and is accurate and complete [name], DO          Note: This dictation was prepared with Dragon dictation along with smaller phrase technology. Any transcriptional errors that result from this process are unintentional.

## 2023-12-31 ENCOUNTER — Ambulatory Visit: Payer: Commercial Managed Care - PPO | Admitting: Family Medicine

## 2023-12-31 ENCOUNTER — Encounter: Payer: Self-pay | Admitting: Family Medicine

## 2023-12-31 VITALS — BP 106/88 | HR 96 | Ht 63.0 in | Wt 137.0 lb

## 2023-12-31 DIAGNOSIS — M9904 Segmental and somatic dysfunction of sacral region: Secondary | ICD-10-CM | POA: Diagnosis not present

## 2023-12-31 DIAGNOSIS — M9908 Segmental and somatic dysfunction of rib cage: Secondary | ICD-10-CM

## 2023-12-31 DIAGNOSIS — M9903 Segmental and somatic dysfunction of lumbar region: Secondary | ICD-10-CM

## 2023-12-31 DIAGNOSIS — M25511 Pain in right shoulder: Secondary | ICD-10-CM | POA: Diagnosis not present

## 2023-12-31 DIAGNOSIS — M41125 Adolescent idiopathic scoliosis, thoracolumbar region: Secondary | ICD-10-CM

## 2023-12-31 DIAGNOSIS — M9901 Segmental and somatic dysfunction of cervical region: Secondary | ICD-10-CM

## 2023-12-31 DIAGNOSIS — M9902 Segmental and somatic dysfunction of thoracic region: Secondary | ICD-10-CM | POA: Diagnosis not present

## 2023-12-31 DIAGNOSIS — M2550 Pain in unspecified joint: Secondary | ICD-10-CM | POA: Diagnosis not present

## 2023-12-31 MED ORDER — METHYLPREDNISOLONE ACETATE 80 MG/ML IJ SUSP
80.0000 mg | Freq: Once | INTRAMUSCULAR | Status: AC
  Administered 2023-12-31: 80 mg via INTRAMUSCULAR

## 2023-12-31 MED ORDER — KETOROLAC TROMETHAMINE 60 MG/2ML IM SOLN
60.0000 mg | Freq: Once | INTRAMUSCULAR | Status: AC
Start: 2023-12-31 — End: 2023-12-31
  Administered 2023-12-31: 60 mg via INTRAMUSCULAR

## 2023-12-31 NOTE — Patient Instructions (Addendum)
 Trigger point injections Full cocktail given today. Good to see you! See you again in 2 weeks

## 2023-12-31 NOTE — Assessment & Plan Note (Addendum)
 Chronic, with worsening symptoms.  Has had some mild stress.  Discussed icing regimen and home exercises, which activities to do and which ones to avoid.  Increase activity slowly.  Does have the underlying scoliosis will need to continue to monitor.  Follow-up with me again in 6 to 8 weeks due to the severity of pain Toradol and Depo-Medrol injections given today.

## 2023-12-31 NOTE — Assessment & Plan Note (Signed)
 Discussed with patient about posture and ergonomics, which activities to do and which ones to avoid.  Increase activity slowly.  Discussed icing regimen.  Discussed ergonomics.  Has been traveling and likely this was a more of the exacerbation than anything else at the moment.  Follow-up again in 6 to 8 weeks

## 2024-01-03 ENCOUNTER — Encounter: Payer: Self-pay | Admitting: Family Medicine

## 2024-01-03 ENCOUNTER — Other Ambulatory Visit: Payer: Self-pay

## 2024-01-03 DIAGNOSIS — M25511 Pain in right shoulder: Secondary | ICD-10-CM

## 2024-01-03 DIAGNOSIS — M546 Pain in thoracic spine: Secondary | ICD-10-CM

## 2024-01-03 DIAGNOSIS — M542 Cervicalgia: Secondary | ICD-10-CM

## 2024-01-04 ENCOUNTER — Ambulatory Visit

## 2024-01-04 DIAGNOSIS — M542 Cervicalgia: Secondary | ICD-10-CM | POA: Diagnosis not present

## 2024-01-04 DIAGNOSIS — M419 Scoliosis, unspecified: Secondary | ICD-10-CM | POA: Diagnosis not present

## 2024-01-04 DIAGNOSIS — M25512 Pain in left shoulder: Secondary | ICD-10-CM | POA: Diagnosis not present

## 2024-01-04 DIAGNOSIS — M546 Pain in thoracic spine: Secondary | ICD-10-CM | POA: Diagnosis not present

## 2024-01-04 DIAGNOSIS — M25511 Pain in right shoulder: Secondary | ICD-10-CM

## 2024-01-04 DIAGNOSIS — M4802 Spinal stenosis, cervical region: Secondary | ICD-10-CM | POA: Diagnosis not present

## 2024-01-04 DIAGNOSIS — M2578 Osteophyte, vertebrae: Secondary | ICD-10-CM | POA: Diagnosis not present

## 2024-01-04 DIAGNOSIS — M47812 Spondylosis without myelopathy or radiculopathy, cervical region: Secondary | ICD-10-CM | POA: Diagnosis not present

## 2024-01-10 NOTE — Progress Notes (Signed)
 [name] [address] [place] [address] Phone: [phone] Subjective:   Madison [name], am serving as a scribe for Dr. Antoine Primas.  I'm seeing this patient by the request  of:  Pincus Sanes, MD  CC: neck and back pain follow up   [id number]  Madison [name] is a 38 y.o. female coming in with complaint of back and neck pain. OMT 12/31/2023. Patient states doing about 40% better. Able to sit with no pain. Still a constant pain, but not getting worse.  Medications patient has been prescribed: Zanaflex, Prednisone  Taking:         Reviewed prior external information including notes and imaging from previsou exam, outside providers and external EMR if available.   As well as notes that were available from care everywhere and other healthcare systems.  Past medical history, social, surgical and family history all reviewed in electronic medical record.  No pertanent information unless stated regarding to the chief complaint.   Past Medical History:  Diagnosis Date   Allergy    Asthma    Hx of migraines 01/012003   can tell when they come on, treats with excedrin migraine.   Scoliosis 10/24/1995   Vitamin D deficiency     Allergies  Allergen Reactions   Kiwi Extract Anaphylaxis   Prednisone     Tingling in hands and face   Meloxicam     heartburn     Review of Systems:  No headache, visual changes, nausea, vomiting, diarrhea, constipation, dizziness, abdominal pain, skin rash, fevers, chills, night sweats, weight loss, swollen lymph nodes, body aches, joint swelling, chest pain, shortness of breath, mood changes. POSITIVE muscle aches  Objective  Height 5\' 3"  (1.6 m).   General: No apparent distress alert and oriented x3 mood and affect normal, dressed appropriately.  HEENT: Pupils equal, extraocular movements intact  Respiratory: Patient's speak in full sentences and does not appear short of breath   Cardiovascular: No lower extremity edema, non tender, no erythema  Gait MSK:  Back: Scoliosis noted.  Patient does have significant tightness to the right sacroiliac joint.  Significant tightness noted in the right acromioclavicular as well.  Positive crossover noted.  Patient does have impingement in different areas.  Questionable labral pathology with positive O'Brien's.  Osteopathic findings  C2 flexed rotated and side bent right C6 flexed rotated and side bent left T3 extended rotated and side bent right inhaled rib L2 flexed rotated and side bent right L4 flexed rotated and side bent left Sacrum right on right       Assessment and Plan:  Right shoulder pain Right shoulder pain that has been intermittent and now more constant over the course of several months.  Patient has done formal physical therapy, and continues to have pain.  He does have some signs and symptoms consistent with O'Brien's that is concerning for labral pathology.  Affecting daily activities including getting dressed at this time.  Do feel an MR arthrogram is necessary at this time.  Will get imaging workup at this time and see what is potentially contributing.  Depending on findings we will discuss other management options afterwards.  Scoliosis Continuing to have chronic pain noted.  Seems to be in the thoracic area.  X-rays show that everything appears to be stable.  Do feel at this point with the pain as well as with some underlying anxiety that seems to be worsening do feel that at medication options are  available.  Discussed potential different treatment options and patient has elected to try the Effexor.  Potential side effects.  Discussed icing regimen and home exercises, increase activity slowly.  Follow-up again in 6 to 8 weeks.    Nonallopathic problems  Decision today to treat with OMT was based on Physical Exam  After verbal consent patient was treated with HVLA, ME, FPR techniques in cervical,  rib, thoracic, lumbar, and sacral  areas  Patient tolerated the procedure well with improvement in symptoms  Patient given exercises, stretches and lifestyle modifications  See medications in patient instructions if given  Patient will follow up in 4-8 weeks     The above documentation has been reviewed and is accurate and complete [name], DO         Note: This dictation was prepared with Dragon dictation along with smaller phrase technology. Any transcriptional errors that result from this process are unintentional.

## 2024-01-14 ENCOUNTER — Other Ambulatory Visit: Payer: Self-pay | Admitting: Family Medicine

## 2024-01-14 ENCOUNTER — Encounter: Payer: Self-pay | Admitting: Family Medicine

## 2024-01-14 ENCOUNTER — Ambulatory Visit: Admitting: Family Medicine

## 2024-01-14 ENCOUNTER — Other Ambulatory Visit (HOSPITAL_BASED_OUTPATIENT_CLINIC_OR_DEPARTMENT_OTHER): Payer: Self-pay

## 2024-01-14 VITALS — Ht 63.0 in

## 2024-01-14 DIAGNOSIS — M9908 Segmental and somatic dysfunction of rib cage: Secondary | ICD-10-CM

## 2024-01-14 DIAGNOSIS — M9903 Segmental and somatic dysfunction of lumbar region: Secondary | ICD-10-CM

## 2024-01-14 DIAGNOSIS — M9902 Segmental and somatic dysfunction of thoracic region: Secondary | ICD-10-CM | POA: Diagnosis not present

## 2024-01-14 DIAGNOSIS — G8929 Other chronic pain: Secondary | ICD-10-CM | POA: Diagnosis not present

## 2024-01-14 DIAGNOSIS — M9904 Segmental and somatic dysfunction of sacral region: Secondary | ICD-10-CM | POA: Diagnosis not present

## 2024-01-14 DIAGNOSIS — M25511 Pain in right shoulder: Secondary | ICD-10-CM | POA: Diagnosis not present

## 2024-01-14 DIAGNOSIS — M9901 Segmental and somatic dysfunction of cervical region: Secondary | ICD-10-CM | POA: Diagnosis not present

## 2024-01-14 MED ORDER — VENLAFAXINE HCL ER 37.5 MG PO CP24
37.5000 mg | ORAL_CAPSULE | Freq: Every day | ORAL | 0 refills | Status: DC
  Filled 2024-01-14: qty 30, 30d supply, fill #0

## 2024-01-14 MED ORDER — VITAMIN D (ERGOCALCIFEROL) 1.25 MG (50000 UNIT) PO CAPS
50000.0000 [IU] | ORAL_CAPSULE | ORAL | 0 refills | Status: DC
  Filled 2024-01-14: qty 12, 84d supply, fill #0

## 2024-01-14 NOTE — Patient Instructions (Addendum)
 Effexor 37.5mg  MRA R shoulder: [phone] We are here for you See me in 3-4 weeks

## 2024-01-14 NOTE — Assessment & Plan Note (Signed)
 Right shoulder pain that has been intermittent and now more constant over the course of several months.  Patient has done formal physical therapy, and continues to have pain.  He does have some signs and symptoms consistent with O'Brien's that is concerning for labral pathology.  Affecting daily activities including getting dressed at this time.  Do feel an MR arthrogram is necessary at this time.  Will get imaging workup at this time and see what is potentially contributing.  Depending on findings we will discuss other management options afterwards.

## 2024-01-14 NOTE — Assessment & Plan Note (Signed)
 Continuing to have chronic pain noted.  Seems to be in the thoracic area.  X-rays show that everything appears to be stable.  Do feel at this point with the pain as well as with some underlying anxiety that seems to be worsening do feel that at medication options are available.  Discussed potential different treatment options and patient has elected to try the Effexor.  Potential side effects.  Discussed icing regimen and home exercises, increase activity slowly.  Follow-up again in 6 to 8 weeks.

## 2024-01-18 ENCOUNTER — Encounter: Payer: Self-pay | Admitting: Family Medicine

## 2024-01-23 ENCOUNTER — Ambulatory Visit
Admission: RE | Admit: 2024-01-23 | Discharge: 2024-01-23 | Disposition: A | Discharge status: Home / Self Care | Source: Ambulatory Visit | Attending: Family Medicine | Admitting: Family Medicine

## 2024-01-23 ENCOUNTER — Encounter: Payer: Self-pay | Admitting: Family Medicine

## 2024-01-23 DIAGNOSIS — M25511 Pain in right shoulder: Secondary | ICD-10-CM | POA: Diagnosis not present

## 2024-01-23 MED ORDER — IOPAMIDOL (ISOVUE-M 200) INJECTION 41%
10.0000 mL | Freq: Once | INTRAMUSCULAR | Status: AC
  Administered 2024-01-23: 10 mL via INTRA_ARTICULAR

## 2024-01-27 ENCOUNTER — Encounter: Payer: Self-pay | Admitting: Family Medicine

## 2024-01-29 ENCOUNTER — Other Ambulatory Visit: Payer: Self-pay

## 2024-01-29 DIAGNOSIS — M25511 Pain in right shoulder: Secondary | ICD-10-CM

## 2024-01-30 DIAGNOSIS — F419 Anxiety disorder, unspecified: Secondary | ICD-10-CM | POA: Diagnosis not present

## 2024-01-30 DIAGNOSIS — Z6823 Body mass index (BMI) 23.0-23.9, adult: Secondary | ICD-10-CM | POA: Diagnosis not present

## 2024-01-30 DIAGNOSIS — Z01419 Encounter for gynecological examination (general) (routine) without abnormal findings: Secondary | ICD-10-CM | POA: Diagnosis not present

## 2024-01-30 DIAGNOSIS — Z1151 Encounter for screening for human papillomavirus (HPV): Secondary | ICD-10-CM | POA: Diagnosis not present

## 2024-01-30 DIAGNOSIS — Z124 Encounter for screening for malignant neoplasm of cervix: Secondary | ICD-10-CM | POA: Diagnosis not present

## 2024-01-31 NOTE — Progress Notes (Signed)
 [name] [address] [place] [address] Phone: [phone] Subjective:   Madison [name], am serving as a scribe for Dr. Ronnell Coins.  I'm seeing this patient by the request  of:  Colene Dauphin, MD  CC: Back and neck pain follow-up  [id number]  Madison [name] is a 38 y.o. female coming in with complaint of back and neck pain. OMT 01/14/2024. Patient states that she is no longer being woken up at night. At end of day R shoulder pain radiates into the neck.   Medications patient has been prescribed: Zanaflex , Vit D, Effexor   Taking:    Since we have seen patient did have an MRI of the right shoulder that did have a nondisplaced tear at the base of the posterior inferior labrum.  Rotator cuff appears to be intact otherwise.  Reviewing other imaging did show the patient does have some mild degenerative disc disease from C5-C7 of the neck that has progressed over the last 6 months.     Reviewed prior external information including notes and imaging from previsou exam, outside providers and external EMR if available.   As well as notes that were available from care everywhere and other healthcare systems.  Past medical history, social, surgical and family history all reviewed in electronic medical record.  No pertanent information unless stated regarding to the chief complaint.   Past Medical History:  Diagnosis Date   Allergy    Asthma    Hx of migraines 01/012003   can tell when they come on, treats with excedrin migraine.   Scoliosis 10/24/1995   Vitamin D  deficiency     Allergies  Allergen Reactions   Kiwi Extract Anaphylaxis   Prednisone      Tingling in hands and face   Meloxicam      heartburn     Review of Systems:  No headache, visual changes, nausea, vomiting, diarrhea, constipation, dizziness, abdominal pain, skin rash, fevers, chills, night sweats, weight loss, swollen lymph nodes, body aches, joint swelling,  chest pain, shortness of breath, mood changes. POSITIVE muscle aches  Objective  Blood pressure 102/72, height 5\' 3"  (1.6 m), weight 133 lb (60.3 kg).   General: No apparent distress alert and oriented x3 mood and affect normal, dressed appropriately.  HEENT: Pupils equal, extraocular movements intact  Respiratory: Patient's speak in full sentences and does not appear short of breath  Cardiovascular: No lower extremity edema, non tender, no erythema  Gait normal  MSK:  Back scoliosis noted, tightness right scapular patient does have some tenderness but no significant trigger points noted today.  Osteopathic findings  C2 flexed rotated and side bent right C7 flexed rotated and side bent left T3 extended rotated and side bent right inhaled rib T8 extended rotated and side bent left L1 flexed rotated and side bent right Sacrum right on right       Assessment and Plan:  Right shoulder pain Discussed with patient about potentially repeating the glenohumeral joint.  Holding on any type of surgical intervention.  Will consider the possibility of an injection if necessary.  Attempted osteopathic manipulation today.  Discussed icing regimen.  Follow-up again in 6 to 8 weeks otherwise.    Nonallopathic problems  Decision today to treat with OMT was based on Physical Exam  After verbal consent patient was treated with  ME, FPR techniques in cervical, rib, thoracic, lumbar, and sacral  areas  Patient tolerated the procedure well with improvement in symptoms  Patient given exercises, stretches and lifestyle modifications  See medications in patient instructions if given  Patient will follow up in 4-8 weeks     The above documentation has been reviewed and is accurate and complete Madison Woodson M Allenmichael Mcpartlin, DO         Note: This dictation was prepared with Dragon dictation along with smaller phrase technology. Any transcriptional errors that result from this process are unintentional.

## 2024-02-05 DIAGNOSIS — M25511 Pain in right shoulder: Secondary | ICD-10-CM | POA: Diagnosis not present

## 2024-02-05 LAB — HM PAP SMEAR: HPV, high-risk: NEGATIVE

## 2024-02-06 DIAGNOSIS — H5203 Hypermetropia, bilateral: Secondary | ICD-10-CM | POA: Diagnosis not present

## 2024-02-06 DIAGNOSIS — H52223 Regular astigmatism, bilateral: Secondary | ICD-10-CM | POA: Diagnosis not present

## 2024-02-11 ENCOUNTER — Other Ambulatory Visit: Payer: Self-pay | Admitting: Family Medicine

## 2024-02-11 ENCOUNTER — Ambulatory Visit: Admitting: Family Medicine

## 2024-02-11 ENCOUNTER — Other Ambulatory Visit: Payer: Self-pay

## 2024-02-11 ENCOUNTER — Other Ambulatory Visit (HOSPITAL_BASED_OUTPATIENT_CLINIC_OR_DEPARTMENT_OTHER): Payer: Self-pay

## 2024-02-11 ENCOUNTER — Encounter: Payer: Self-pay | Admitting: Family Medicine

## 2024-02-11 VITALS — BP 102/72 | Ht 63.0 in | Wt 133.0 lb

## 2024-02-11 DIAGNOSIS — M9901 Segmental and somatic dysfunction of cervical region: Secondary | ICD-10-CM | POA: Diagnosis not present

## 2024-02-11 DIAGNOSIS — M9902 Segmental and somatic dysfunction of thoracic region: Secondary | ICD-10-CM | POA: Diagnosis not present

## 2024-02-11 DIAGNOSIS — M9908 Segmental and somatic dysfunction of rib cage: Secondary | ICD-10-CM | POA: Diagnosis not present

## 2024-02-11 DIAGNOSIS — G8929 Other chronic pain: Secondary | ICD-10-CM | POA: Diagnosis not present

## 2024-02-11 DIAGNOSIS — M9903 Segmental and somatic dysfunction of lumbar region: Secondary | ICD-10-CM

## 2024-02-11 DIAGNOSIS — M25511 Pain in right shoulder: Secondary | ICD-10-CM

## 2024-02-11 DIAGNOSIS — M9904 Segmental and somatic dysfunction of sacral region: Secondary | ICD-10-CM

## 2024-02-11 DIAGNOSIS — F419 Anxiety disorder, unspecified: Secondary | ICD-10-CM | POA: Diagnosis not present

## 2024-02-11 MED ORDER — VENLAFAXINE HCL ER 37.5 MG PO CP24
37.5000 mg | ORAL_CAPSULE | Freq: Every day | ORAL | 0 refills | Status: DC
  Filled 2024-02-11: qty 90, 90d supply, fill #0

## 2024-02-11 NOTE — Assessment & Plan Note (Signed)
 Discussed with patient about potentially repeating the glenohumeral joint.  Holding on any type of surgical intervention.  Will consider the possibility of an injection if necessary.  Attempted osteopathic manipulation today.  Discussed icing regimen.  Follow-up again in 6 to 8 weeks otherwise.

## 2024-02-11 NOTE — Patient Instructions (Signed)
See me again in 4-6 weeks

## 2024-02-13 ENCOUNTER — Telehealth (HOSPITAL_BASED_OUTPATIENT_CLINIC_OR_DEPARTMENT_OTHER): Payer: Self-pay | Admitting: Physical Therapy

## 2024-02-13 NOTE — Telephone Encounter (Signed)
 Called and LVM to remind patient of upcoming physical therapy evaluation appointment. Requested call back to confirm appointment attendance.

## 2024-02-14 ENCOUNTER — Ambulatory Visit (HOSPITAL_BASED_OUTPATIENT_CLINIC_OR_DEPARTMENT_OTHER): Attending: Orthopaedic Surgery | Admitting: Physical Therapy

## 2024-02-14 DIAGNOSIS — M25511 Pain in right shoulder: Secondary | ICD-10-CM | POA: Diagnosis not present

## 2024-02-14 DIAGNOSIS — R293 Abnormal posture: Secondary | ICD-10-CM | POA: Diagnosis not present

## 2024-02-14 DIAGNOSIS — G8929 Other chronic pain: Secondary | ICD-10-CM | POA: Insufficient documentation

## 2024-02-14 NOTE — Therapy (Signed)
 OUTPATIENT PHYSICAL THERAPY EVALUATION   Patient Name: Madison [name] MRN: [id number] DOB:1985/11/12, 38 y.o., female Today's Date: 02/15/2024  END OF SESSION:  PT End of Session - 02/14/24 1517     Visit Number 1    Number of Visits 25    Date for PT Re-Evaluation 05/09/24    Authorization Type [facility] [employer]    PT Start Time 1517    PT Stop Time 1600    PT Time Calculation (min) 43 min    Activity Tolerance Patient tolerated treatment well    Behavior During Therapy [facility] for tasks assessed/performed             Past Medical History:  Diagnosis Date   Allergy    Asthma    Hx of migraines 01/012003   can tell when they come on, treats with excedrin migraine.   Scoliosis 10/24/1995   Vitamin D  deficiency    Past Surgical History:  Procedure Laterality Date   partial spinal fusion C7-T2 due to scoliosis 03/1998 N/A 04/11/1998   TONSILLECTOMY     WISDOM TOOTH EXTRACTION     Patient Active Problem List   Diagnosis Date Noted   Fatigue 10/05/2023   Upper respiratory tract infection 10/05/2023   Pharyngitis 10/05/2023   Right shoulder pain 04/24/2023   Hyperlipidemia 02/25/2023   Arthralgia of right acromioclavicular joint 11/28/2022   Acute sinus infection 09/28/2022   Abrasion of left foot 07/27/2022   Puncture wound of skin from metal nail 07/27/2022   B12 deficiency 02/08/2022   Vitamin D  deficiency 02/08/2022   Carpal tunnel syndrome on right 11/07/2021   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   Chronic urticaria 05/11/2021   Other adverse food reactions, not elsewhere classified, subsequent encounter 05/11/2021   Seasonal and perennial allergic rhinitis 05/11/2021   Mild intermittent asthma without complication 02/02/2021   Trigger point of right shoulder region 04/07/2020   Nonallopathic lesion of lumbosacral region 11/07/2018   Nonallopathic lesion of sacral region 11/07/2018   Nonallopathic lesion of thoracic region 11/07/2018   Volar plate  injury of finger 01/22/2018   Scoliosis 08/21/2017    REFERRING PROVIDER: Grafton Lawrence, MD  REFERRING DIAG: Rt shoulder posterior labrum tear  Rationale for Evaluation and Treatment: Rehabilitation  THERAPY DIAG:  Chronic right shoulder pain  Abnormal posture  ONSET DATE: chronic back pain with subacute on chronic shoulder pain (March 2024)   SUBJECTIVE:  SUBJECTIVE STATEMENT: Have been seeing Dr Felipe Horton for about 6 years due to scoliosis. Neck pain without N/T. Rt-handed.   PERTINENT HISTORY:  Scoliosis with fusions that began at 38 yo   PAIN:  Are you having pain? Yes: NPRS scale: 3 at rest, 6 at worst Pain location: Rt shoulder into neck Pain description: ache Aggravating factors: dressing/undressing- rotation motion, laying on the shoulder, aches by end of day at computer Relieving factors: ice  PRECAUTIONS:  None  RED FLAGS: None   WEIGHT BEARING RESTRICTIONS:  No  FALLS:  Has patient fallen in last 6 months? No  OCCUPATION:  [employer]  PLOF:  Independent  PATIENT GOALS:  Avoid surgery, decr pain   OBJECTIVE:  Note: Objective measures were completed at Evaluation unless otherwise noted.  DIAGNOSTIC FINDINGS:  MRI 4/2: IMPRESSION: 1. Findings suggestive of a nondisplaced tear at the base of the posteroinferior labrum. 2. No rotator cuff tear identified.  PATIENT SURVEYS:  Quick Dash 22.73   POSTURE:  Thoracic dextro/lumbar levoscoliosis with Rt shoulder elevation and scapular winging  HAND DOMINANCE:  Right    Body Part #1 Shoulder   UPPER EXTREMITY MMT: EVAL: Able to demo strength hold against resistance with poor scapular control.                                                                                                                    TREATMENT DATE:   Treatment                            4/25: See HEP Pec minor/major mobilization   PATIENT EDUCATION:  Education details: Anatomy of condition, POC, HEP, exercise form/rationale Person educated: Patient Education method: Explanation, Demonstration, Tactile cues, Verbal cues, and Handouts Education comprehension: verbalized understanding, returned demonstration, verbal cues required, tactile cues required, and needs further education  HOME EXERCISE PROGRAM: Access Code: [email or web address] URL: [email or web address] Date: 02/15/2024 Prepared by: Keven Pel  Program Notes lay down with core tight and shoulder blade back, inhale to fill upper right lung  Exercises - Seated Scapular Retraction  - 7 x weekly - Supine Shoulder External Rotation with Resistance  - 2-3 x daily - 7 x weekly - 1 sets - 10 reps - Standing Shoulder External Rotation with Resistance  - 2-3 x daily - 7 x weekly - 1 sets - 10 reps - Standing Bent Over on Chair Single Arm Tricep Extension with Dumbbell  - 1 x daily - 7 x weekly - 3 sets - 10 reps - Seated Upper Trapezius Stretch  - 2-3 x daily - 7 x weekly - 2 sets - 3 breaths hold - Standing Bicep Curls with Dumbbells and Rotation  - 1 x daily - 7 x weekly - 3 sets - 10 reps   ASSESSMENT:  CLINICAL IMPRESSION: Patient is a 38 y.o. F who was seen today for physical therapy evaluation and treatment for Rt shoulder pain of insidious onset. Pt with scoliotic  curve presenting risk of impingement, poor scapular control and labral stress. Good tolerance to exercise. I expect posture will present minimal change as she has fusions through her spine, but will significantly benefit from periscapular stability and upper quadrant mobilization for improved shoulder mechanics.      REHAB POTENTIAL: Good  CLINICAL DECISION MAKING: Stable/uncomplicated  EVALUATION COMPLEXITY: Low   GOALS: Goals reviewed with patient? Yes  SHORT  TERM GOALS: Target date: 5/24  Able to demo scapular control with UE MMT Baseline: Goal status: INITIAL  2.  Verbalize ability to correct posture throughout the day, particularly at work with her right arm reaching forward to a mouse/keyboard Baseline:  Goal status: INITIAL  3.  Begin incorporating UE strength back into regular gym workout Baseline:  Goal status: INITIAL    LONG TERM GOALS: Target date: POC date  Able to strengthen upper body during regular workouts with minimal to no discomfort in shoulder Baseline:  Goal status: INITIAL  2.  Quick [place] Retort to improve by MDC Baseline:  Goal status: INITIAL  3.  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  Able to maintain minimal to no pain throughout regular daily activities Baseline:  Goal status: INITIAL    PLAN:  PT FREQUENCY: 1-2x/week  PT DURATION: POC date  PLANNED INTERVENTIONS: 97164- PT Re-evaluation, 97750- Physical Performance Testing, 97110-Therapeutic exercises, 97530- Therapeutic activity, [id number]- Neuromuscular re-education, 97535- Self Care, 04540- Manual therapy, [phone]- Aquatic Therapy, Patient/Family education, Taping, Dry Needling, Joint mobilization, Spinal mobilization, Scar mobilization, Cryotherapy, and Moist heat.  PLAN FOR NEXT SESSION: STM/DN PRN, review rib cage expansion with breathing   Marjarie Irion C. Adley Mazurowski PT, DPT 02/15/24 8:21 PM

## 2024-02-15 ENCOUNTER — Other Ambulatory Visit: Payer: Self-pay

## 2024-02-15 ENCOUNTER — Encounter (HOSPITAL_BASED_OUTPATIENT_CLINIC_OR_DEPARTMENT_OTHER): Payer: Self-pay | Admitting: Physical Therapy

## 2024-02-20 ENCOUNTER — Ambulatory Visit (HOSPITAL_BASED_OUTPATIENT_CLINIC_OR_DEPARTMENT_OTHER): Admitting: Physical Therapy

## 2024-02-20 ENCOUNTER — Encounter (HOSPITAL_BASED_OUTPATIENT_CLINIC_OR_DEPARTMENT_OTHER): Payer: Self-pay | Admitting: Physical Therapy

## 2024-02-20 DIAGNOSIS — M25511 Pain in right shoulder: Secondary | ICD-10-CM | POA: Diagnosis not present

## 2024-02-20 DIAGNOSIS — R293 Abnormal posture: Secondary | ICD-10-CM | POA: Diagnosis not present

## 2024-02-20 DIAGNOSIS — G8929 Other chronic pain: Secondary | ICD-10-CM

## 2024-02-20 NOTE — Therapy (Signed)
 OUTPATIENT PHYSICAL THERAPY TREATMENT   Patient Name: Madison [name] MRN: [id number] DOB:June 27, 1986, 38 y.o., female Today's Date: 02/20/2024  END OF SESSION:  PT End of Session - 02/20/24 0959     Visit Number 2    Number of Visits 25    Date for PT Re-Evaluation 05/09/24    Authorization Type [facility] [employer]    PT Start Time 0715    PT Stop Time 0755    PT Time Calculation (min) 40 min    Activity Tolerance Patient tolerated treatment well    Behavior During Therapy [facility] for tasks assessed/performed              Past Medical History:  Diagnosis Date   Allergy    Asthma    Hx of migraines 01/012003   can tell when they come on, treats with excedrin migraine.   Scoliosis 10/24/1995   Vitamin D  deficiency    Past Surgical History:  Procedure Laterality Date   partial spinal fusion C7-T2 due to scoliosis 03/1998 N/A 04/11/1998   TONSILLECTOMY     WISDOM TOOTH EXTRACTION     Patient Active Problem List   Diagnosis Date Noted   Fatigue 10/05/2023   Upper respiratory tract infection 10/05/2023   Pharyngitis 10/05/2023   Right shoulder pain 04/24/2023   Hyperlipidemia 02/25/2023   Arthralgia of right acromioclavicular joint 11/28/2022   Acute sinus infection 09/28/2022   Abrasion of left foot 07/27/2022   Puncture wound of skin from metal nail 07/27/2022   B12 deficiency 02/08/2022   Vitamin D  deficiency 02/08/2022   Carpal tunnel syndrome on right 11/07/2021   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   Chronic urticaria 05/11/2021   Other adverse food reactions, not elsewhere classified, subsequent encounter 05/11/2021   Seasonal and perennial allergic rhinitis 05/11/2021   Mild intermittent asthma without complication 02/02/2021   Trigger point of right shoulder region 04/07/2020   Nonallopathic lesion of lumbosacral region 11/07/2018   Nonallopathic lesion of sacral region 11/07/2018   Nonallopathic lesion of thoracic region 11/07/2018   Volar plate  injury of finger 01/22/2018   Scoliosis 08/21/2017    REFERRING PROVIDER: Grafton Lawrence, MD  REFERRING DIAG: Rt shoulder posterior labrum tear  Rationale for Evaluation and Treatment: Rehabilitation  THERAPY DIAG:  Chronic right shoulder pain  Abnormal posture  ONSET DATE: chronic back pain with subacute on chronic shoulder pain (March 2024)   SUBJECTIVE:  SUBJECTIVE STATEMENT: Pt states that she is in more pain today than she was when she first started PT. No complaints about exercises at this time.   EVAL: Have been seeing Dr Felipe Horton for about 6 years due to scoliosis. Neck pain without N/T. Rt-handed.   PERTINENT HISTORY:  Scoliosis with fusions that began at 38 yo   PAIN:  Are you having pain? Yes: NPRS scale: 3 at rest, 6 at worst Pain location: Rt shoulder into neck Pain description: ache Aggravating factors: dressing/undressing- rotation motion, laying on the shoulder, aches by end of day at computer Relieving factors: ice  PRECAUTIONS:  None  RED FLAGS: None   WEIGHT BEARING RESTRICTIONS:  No  FALLS:  Has patient fallen in last 6 months? No  OCCUPATION:  [employer]  PLOF:  Independent  PATIENT GOALS:  Avoid surgery, decr pain   OBJECTIVE:  Note: Objective measures were completed at Evaluation unless otherwise noted.  DIAGNOSTIC FINDINGS:  MRI 4/2: IMPRESSION: 1. Findings suggestive of a nondisplaced tear at the base of the posteroinferior labrum. 2. No rotator cuff tear identified.  PATIENT SURVEYS:  Quick Dash 22.73   POSTURE:  Thoracic dextro/lumbar levoscoliosis with Rt shoulder elevation and scapular winging  HAND DOMINANCE:  Right    Body Part #1 Shoulder   UPPER EXTREMITY MMT: EVAL: Able to demo strength hold against resistance  with poor scapular control.                                                                                                              TREATMENT DATE:  Treatment                            4/25: Trigger Point Dry Needling  Initial Treatment: Pt instructed on Dry Needling rational, procedures, and possible side effects. Pt instructed to expect mild to moderate muscle soreness later in the day and/or into the next day.  Pt instructed in methods to reduce muscle soreness. Pt instructed to continue prescribed HEP. Because Dry Needling was performed over or adjacent to a lung field, pt was educated on S/S of pneumothorax and to seek immediate medical attention should they occur.  Patient was educated on signs and symptoms of infection and other risk factors and advised to seek medical attention should they occur.  Patient verbalized understanding of these instructions and education.   Patient Verbal Consent Given: Yes Education Handout Provided: No pt declined Muscles Treated: R UT Electrical Stimulation Performed: No Treatment Response/Outcome: Decreased pain, improved mobility.   - UT soft tissue mobility on R side - Manual pec stretch  - Assessment and STM of lats on R side.  - Doorway pec stretch  - Star gazer stretch - Prone rows - Bilat ER with GTB - UT stretch  - LS stretch  - bent over triceps extension with row  - seated scap retraction   Treatment  4/25: See HEP Pec minor/major mobilization   PATIENT EDUCATION:  Education details: Anatomy of condition, POC, HEP, exercise form/rationale Person educated: Patient Education method: Explanation, Demonstration, Tactile cues, Verbal cues, and Handouts Education comprehension: verbalized understanding, returned demonstration, verbal cues required, tactile cues required, and needs further education  HOME EXERCISE PROGRAM: Access Code: [email or web address] URL: [email or web address] Date:  02/15/2024 Prepared by: Keven Pel  Program Notes lay down with core tight and shoulder blade back, inhale to fill upper right lung  Exercises - Seated Scapular Retraction  - 7 x weekly - Supine Shoulder External Rotation with Resistance  - 2-3 x daily - 7 x weekly - 1 sets - 10 reps - Standing Shoulder External Rotation with Resistance  - 2-3 x daily - 7 x weekly - 1 sets - 10 reps - Standing Bent Over on Chair Single Arm Tricep Extension with Dumbbell  - 1 x daily - 7 x weekly - 3 sets - 10 reps - Seated Upper Trapezius Stretch  - 2-3 x daily - 7 x weekly - 2 sets - 3 breaths hold - Standing Bicep Curls with Dumbbells and Rotation  - 1 x daily - 7 x weekly - 3 sets - 10 reps   ASSESSMENT:  CLINICAL IMPRESSION: Patient performed exercises well with no reports of increased pain. She reports improvements in mobility and shoulder burning pain after dry needling. Pt instructed on anatomy of GHJ. Provided updated HEP with new prescribed exercises. Pt will continue to benefit from skilled PT to address continued deficits.    REHAB POTENTIAL: Good  CLINICAL DECISION MAKING: Stable/uncomplicated  EVALUATION COMPLEXITY: Low   GOALS: Goals reviewed with patient? Yes  SHORT TERM GOALS: Target date: 5/24  Able to demo scapular control with UE MMT Baseline: Goal status: INITIAL  2.  Verbalize ability to correct posture throughout the day, particularly at work with her right arm reaching forward to a mouse/keyboard Baseline:  Goal status: INITIAL  3.  Begin incorporating UE strength back into regular gym workout Baseline:  Goal status: INITIAL    LONG TERM GOALS: Target date: POC date  Able to strengthen upper body during regular workouts with minimal to no discomfort in shoulder Baseline:  Goal status: INITIAL  2.  Quick [place] Retort to improve by MDC Baseline:  Goal status: INITIAL  3.  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  Able to maintain minimal to no pain throughout regular daily activities Baseline:  Goal status: INITIAL    PLAN:  PT FREQUENCY: 1-2x/week  PT DURATION: POC date  PLANNED INTERVENTIONS: 97164- PT Re-evaluation, 97750- Physical Performance Testing, 97110-Therapeutic exercises, 97530- Therapeutic activity, [id number]- Neuromuscular re-education, 97535- Self Care, 54098- Manual therapy, [phone]- Aquatic Therapy, Patient/Family education, Taping, Dry Needling, Joint mobilization, Spinal mobilization, Scar mobilization, Cryotherapy, and Moist heat.  PLAN FOR NEXT SESSION: STM/DN PRN, review rib cage expansion with breathing   Fredia Janus PT, DPT 02/20/24  10:48 AM

## 2024-02-21 DIAGNOSIS — F419 Anxiety disorder, unspecified: Secondary | ICD-10-CM | POA: Diagnosis not present

## 2024-02-25 ENCOUNTER — Ambulatory Visit (HOSPITAL_BASED_OUTPATIENT_CLINIC_OR_DEPARTMENT_OTHER): Attending: Orthopaedic Surgery | Admitting: Physical Therapy

## 2024-02-25 ENCOUNTER — Encounter (HOSPITAL_BASED_OUTPATIENT_CLINIC_OR_DEPARTMENT_OTHER): Payer: Self-pay | Admitting: Physical Therapy

## 2024-02-25 DIAGNOSIS — M25511 Pain in right shoulder: Secondary | ICD-10-CM | POA: Diagnosis not present

## 2024-02-25 DIAGNOSIS — G8929 Other chronic pain: Secondary | ICD-10-CM | POA: Insufficient documentation

## 2024-02-25 DIAGNOSIS — R293 Abnormal posture: Secondary | ICD-10-CM | POA: Diagnosis not present

## 2024-02-25 NOTE — Therapy (Signed)
 OUTPATIENT PHYSICAL THERAPY TREATMENT   Patient Name: Madison [name] MRN: [id number] DOB:Mar 18, 1986, 38 y.o., female Today's Date: 02/25/2024  END OF SESSION:  PT End of Session - 02/25/24 1407     Visit Number 3    Number of Visits 25    Date for PT Re-Evaluation 05/09/24    Authorization Type [facility] [employer]    PT Start Time 1405    PT Stop Time 1445    PT Time Calculation (min) 40 min    Activity Tolerance Patient tolerated treatment well    Behavior During Therapy [facility] for tasks assessed/performed               Past Medical History:  Diagnosis Date   Allergy    Asthma    Hx of migraines 01/012003   can tell when they come on, treats with excedrin migraine.   Scoliosis 10/24/1995   Vitamin D  deficiency    Past Surgical History:  Procedure Laterality Date   partial spinal fusion C7-T2 due to scoliosis 03/1998 N/A 04/11/1998   TONSILLECTOMY     WISDOM TOOTH EXTRACTION     Patient Active Problem List   Diagnosis Date Noted   Fatigue 10/05/2023   Upper respiratory tract infection 10/05/2023   Pharyngitis 10/05/2023   Right shoulder pain 04/24/2023   Hyperlipidemia 02/25/2023   Arthralgia of right acromioclavicular joint 11/28/2022   Acute sinus infection 09/28/2022   Abrasion of left foot 07/27/2022   Puncture wound of skin from metal nail 07/27/2022   B12 deficiency 02/08/2022   Vitamin D  deficiency 02/08/2022   Carpal tunnel syndrome on right 11/07/2021   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   Chronic urticaria 05/11/2021   Other adverse food reactions, not elsewhere classified, subsequent encounter 05/11/2021   Seasonal and perennial allergic rhinitis 05/11/2021   Mild intermittent asthma without complication 02/02/2021   Trigger point of right shoulder region 04/07/2020   Nonallopathic lesion of lumbosacral region 11/07/2018   Nonallopathic lesion of sacral region 11/07/2018   Nonallopathic lesion of thoracic region 11/07/2018   Volar plate  injury of finger 01/22/2018   Scoliosis 08/21/2017    REFERRING PROVIDER: Grafton Lawrence, MD  REFERRING DIAG: Rt shoulder posterior labrum tear  Rationale for Evaluation and Treatment: Rehabilitation  THERAPY DIAG:  Chronic right shoulder pain  Abnormal posture  ONSET DATE: chronic back pain with subacute on chronic shoulder pain (March 2024)   SUBJECTIVE:  SUBJECTIVE STATEMENT: Still uncomfrotable but not burning. Laying flat makes it hurt less. Bulk of the pain is at Rt C5-6 and proximal upper trap. Started standing more at work.   EVAL: Have been seeing Dr Felipe Horton for about 6 years due to scoliosis. Neck pain without N/T. Rt-handed.   PERTINENT HISTORY:  Scoliosis with fusions that began at 38 yo   PAIN:  Are you having pain? Yes: NPRS scale: 3 at rest, 6 at worst Pain location: Rt shoulder into neck Pain description: ache Aggravating factors: dressing/undressing- rotation motion, laying on the shoulder, aches by end of day at computer Relieving factors: ice  PRECAUTIONS:  None  RED FLAGS: None   WEIGHT BEARING RESTRICTIONS:  No  FALLS:  Has patient fallen in last 6 months? No  OCCUPATION:  [employer]  PLOF:  Independent  PATIENT GOALS:  Avoid surgery, decr pain   OBJECTIVE:  Note: Objective measures were completed at Evaluation unless otherwise noted.  DIAGNOSTIC FINDINGS:  MRI 4/2: IMPRESSION: 1. Findings suggestive of a nondisplaced tear at the base of the posteroinferior labrum. 2. No rotator cuff tear identified.  PATIENT SURVEYS:  Quick Dash 22.73   POSTURE:  Thoracic dextro/lumbar levoscoliosis with Rt shoulder elevation and scapular winging  HAND DOMINANCE:  Right    Body Part #1 Shoulder   UPPER EXTREMITY MMT: EVAL: Able to demo  strength hold against resistance with poor scapular control.                                                                                                              TREATMENT DATE: Treatment                            5/5: Blank lines following charge title = not provided on this treatment date.   Manual:  TPDN No STM levator & upper trap on Rt Ktape for Rt upper trap inhibition There-ex:  There-Act:  Self Care:  Nuro-Re-ed: Rt sidelying knee over knee PRI 90/90 hip lift with Left arm reach [facility] Gait Training:     Treatment                            4/25: Trigger Point Dry Needling  Initial Treatment: Pt instructed on Dry Needling rational, procedures, and possible side effects. Pt instructed to expect mild to moderate muscle soreness later in the day and/or into the next day.  Pt instructed in methods to reduce muscle soreness. Pt instructed to continue prescribed HEP. Because Dry Needling was performed over or adjacent to a lung field, pt was educated on S/S of pneumothorax and to seek immediate medical attention should they occur.  Patient was educated on signs and symptoms of infection and other risk factors and advised to seek medical attention should they occur.  Patient verbalized understanding of these instructions and education.   Patient Verbal Consent Given: Yes Education Handout Provided: No pt declined Muscles Treated: R  UT Electrical Stimulation Performed: No Treatment Response/Outcome: Decreased pain, improved mobility.   - UT soft tissue mobility on R side - Manual pec stretch  - Assessment and STM of lats on R side.  - Doorway pec stretch  - Star gazer stretch - Prone rows - Bilat ER with GTB - UT stretch  - LS stretch  - bent over triceps extension with row  - seated scap retraction     PATIENT EDUCATION:  Education details: Anatomy of condition, POC, HEP, exercise form/rationale Person educated: Patient Education method: Explanation,  Demonstration, Tactile cues, Verbal cues, and Handouts Education comprehension: verbalized understanding, returned demonstration, verbal cues required, tactile cues required, and needs further education  HOME EXERCISE PROGRAM: Access Code: [email or web address] URL: [email or web address] Date: 02/15/2024 Prepared by: Keven Pel  Program Notes lay down with core tight and shoulder blade back, inhale to fill upper right lung  Exercises - Seated Scapular Retraction  - 7 x weekly - Supine Shoulder External Rotation with Resistance  - 2-3 x daily - 7 x weekly - 1 sets - 10 reps - Standing Shoulder External Rotation with Resistance  - 2-3 x daily - 7 x weekly - 1 sets - 10 reps - Standing Bent Over on Chair Single Arm Tricep Extension with Dumbbell  - 1 x daily - 7 x weekly - 3 sets - 10 reps - Seated Upper Trapezius Stretch  - 2-3 x daily - 7 x weekly - 2 sets - 3 breaths hold - Standing Bicep Curls with Dumbbells and Rotation  - 1 x daily - 7 x weekly - 3 sets - 10 reps Rt sidelying knee over knee PRI 90/90 hip lift with Left arm reach PRI   ASSESSMENT:  CLINICAL IMPRESSION: Pt experienced significant depression of rt shoulder following sidelying knee over knee but shoulder does not tolerate Rt sidelying well. Took pic/vidoe with pt phone to demonstrate spinal support with engagement of Rt core to reduce rib flare on right and expand Rt ant superior/Lt ant inf ribs. Tends to rest with cervical sidebend creating increased demand on right upper trap and levator and discussed using a mirror for retraining of sensation for central alignment.    REHAB POTENTIAL: Good  CLINICAL DECISION MAKING: Stable/uncomplicated  EVALUATION COMPLEXITY: Low   GOALS: Goals reviewed with patient? Yes  SHORT TERM GOALS: Target date: 5/24  Able to demo scapular control with UE MMT Baseline: Goal status: INITIAL  2.  Verbalize ability to correct posture throughout the day, particularly at work  with her right arm reaching forward to a mouse/keyboard Baseline:  Goal status: INITIAL  3.  Begin incorporating UE strength back into regular gym workout Baseline:  Goal status: INITIAL    LONG TERM GOALS: Target date: POC date  Able to strengthen upper body during regular workouts with minimal to no discomfort in shoulder Baseline:  Goal status: INITIAL  2.  Quick [place] Retort to improve by MDC Baseline:  Goal status: INITIAL  3.  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  Able to maintain minimal to no pain throughout regular daily activities Baseline:  Goal status: INITIAL    PLAN:  PT FREQUENCY: 1-2x/week  PT DURATION: POC date  PLANNED INTERVENTIONS: 97164- PT Re-evaluation, 97750- Physical Performance Testing, 97110-Therapeutic exercises, 97530- Therapeutic activity, [id number]- Neuromuscular re-education, 97535- Self Care, 16109- Manual therapy, [phone]- Aquatic Therapy, Patient/Family education, Taping, Dry Needling, Joint mobilization, Spinal mobilization, Scar mobilization, Cryotherapy, and Moist heat.  PLAN FOR NEXT SESSION: STM/DN PRN, review rib cage expansion with breathing   Margarett Viti C. Sharod Petsch PT, DPT 02/25/24 2:53 PM

## 2024-02-26 ENCOUNTER — Encounter (HOSPITAL_BASED_OUTPATIENT_CLINIC_OR_DEPARTMENT_OTHER): Payer: Self-pay | Admitting: Physical Therapy

## 2024-02-29 DIAGNOSIS — F419 Anxiety disorder, unspecified: Secondary | ICD-10-CM | POA: Diagnosis not present

## 2024-03-04 ENCOUNTER — Ambulatory Visit (HOSPITAL_BASED_OUTPATIENT_CLINIC_OR_DEPARTMENT_OTHER): Payer: Self-pay | Admitting: Physical Therapy

## 2024-03-04 ENCOUNTER — Encounter: Payer: Self-pay | Admitting: Internal Medicine

## 2024-03-04 DIAGNOSIS — R293 Abnormal posture: Secondary | ICD-10-CM

## 2024-03-04 DIAGNOSIS — G8929 Other chronic pain: Secondary | ICD-10-CM | POA: Diagnosis not present

## 2024-03-04 DIAGNOSIS — M25511 Pain in right shoulder: Secondary | ICD-10-CM | POA: Diagnosis not present

## 2024-03-04 NOTE — Therapy (Signed)
 OUTPATIENT PHYSICAL THERAPY TREATMENT   Patient Name: Madison [name] MRN: [id number] DOB:1986-10-05, 38 y.o., female Today's Date: 03/04/2024  END OF SESSION:  PT End of Session - 03/04/24 1052     Visit Number 4    Number of Visits 25    Date for PT Re-Evaluation 05/09/24    Authorization Type [facility] [employer]    PT Start Time 1015    PT Stop Time 1050    PT Time Calculation (min) 35 min    Activity Tolerance Patient tolerated treatment well    Behavior During Therapy WFL for tasks assessed/performed                Past Medical History:  Diagnosis Date   Allergy    Asthma    Hx of migraines 01/012003   can tell when they come on, treats with excedrin migraine.   Scoliosis 10/24/1995   Vitamin D  deficiency    Past Surgical History:  Procedure Laterality Date   partial spinal fusion C7-T2 due to scoliosis 03/1998 N/A 04/11/1998   TONSILLECTOMY     WISDOM TOOTH EXTRACTION     Patient Active Problem List   Diagnosis Date Noted   Fatigue 10/05/2023   Upper respiratory tract infection 10/05/2023   Pharyngitis 10/05/2023   Right shoulder pain 04/24/2023   Hyperlipidemia 02/25/2023   Arthralgia of right acromioclavicular joint 11/28/2022   Acute sinus infection 09/28/2022   Abrasion of left foot 07/27/2022   Puncture wound of skin from metal nail 07/27/2022   B12 deficiency 02/08/2022   Vitamin D  deficiency 02/08/2022   Carpal tunnel syndrome on right 11/07/2021   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   Chronic urticaria 05/11/2021   Other adverse food reactions, not elsewhere classified, subsequent encounter 05/11/2021   Seasonal and perennial allergic rhinitis 05/11/2021   Mild intermittent asthma without complication 02/02/2021   Trigger point of right shoulder region 04/07/2020   Nonallopathic lesion of lumbosacral region 11/07/2018   Nonallopathic lesion of sacral region 11/07/2018   Nonallopathic lesion of thoracic region 11/07/2018   Volar  plate injury of finger 01/22/2018   Scoliosis 08/21/2017    REFERRING PROVIDER: Grafton Lawrence, MD  REFERRING DIAG: Rt shoulder posterior labrum tear  Rationale for Evaluation and Treatment: Rehabilitation  THERAPY DIAG:  Chronic right shoulder pain  Abnormal posture  ONSET DATE: chronic back pain with subacute on chronic shoulder pain (March 2024)   SUBJECTIVE:  SUBJECTIVE STATEMENT: I am hurting, achey along upper trap and between shoulder blades.   EVAL: Have been seeing Dr Felipe Horton for about 6 years due to scoliosis. Neck pain without N/T. Rt-handed.   PERTINENT HISTORY:  Scoliosis with fusions that began at 38 yo   PAIN:  Are you having pain? Yes: NPRS scale: 3 at rest, 6 at worst Pain location: Rt shoulder into neck Pain description: ache Aggravating factors: dressing/undressing- rotation motion, laying on the shoulder, aches by end of day at computer Relieving factors: ice  PRECAUTIONS:  None  RED FLAGS: None   WEIGHT BEARING RESTRICTIONS:  No  FALLS:  Has patient fallen in last 6 months? No  OCCUPATION:  [employer] for [employer]  PLOF:  Independent  PATIENT GOALS:  Avoid surgery, decr pain   OBJECTIVE:  Note: Objective measures were completed at Evaluation unless otherwise noted.  DIAGNOSTIC FINDINGS:  MRI 4/2: IMPRESSION: 1. Findings suggestive of a nondisplaced tear at the base of the posteroinferior labrum. 2. No rotator cuff tear identified.  PATIENT SURVEYS:  Quick Dash 22.73   POSTURE:  Thoracic dextro/lumbar levoscoliosis with Rt shoulder elevation and scapular winging  HAND DOMINANCE:  Right    Body Part #1 Shoulder   UPPER EXTREMITY MMT: EVAL: Able to demo strength hold against resistance with poor scapular control.                                                                                                               TREATMENT DATE:   Treatment                            5/13: Blank lines following charge title = not provided on this treatment date.   Manual:  TPDN YES Trigger Point Dry Needling  Subsequent Treatment: Instructions provided previously at initial dry needling treatment.   Patient Verbal Consent Given: Yes Education Handout Provided: Previously Provided Muscles Treated: bilateral upper traps, Rt levator, Rt C7 paraspinals, Rt infraspinatus, Rt rhomboids, bilateral suboccipitals Electrical Stimulation Performed: No Treatment Response/Outcome: twitch with decreased tension Rib mobs, bil first rib depression STM to all muscles DN Cross friction to Rt levator scap There-ex:  There-Act:  Self Care:  Nuro-Re-ed: Prone scap retraction with PT assist on Rt Retraction+GHJ ext in prone Gait Training:  Treatment                            5/5: Blank lines following charge title = not provided on this treatment date.   Manual:  TPDN No STM levator & upper trap on Rt Ktape for Rt upper trap inhibition There-ex:  There-Act:  Self Care:  Nuro-Re-ed: Rt sidelying knee over knee PRI 90/90 hip lift with Left arm reach [facility] Gait Training:     Treatment                            4/25: Trigger Point  Dry Needling  Initial Treatment: Pt instructed on Dry Needling rational, procedures, and possible side effects. Pt instructed to expect mild to moderate muscle soreness later in the day and/or into the next day.  Pt instructed in methods to reduce muscle soreness. Pt instructed to continue prescribed HEP. Because Dry Needling was performed over or adjacent to a lung field, pt was educated on S/S of pneumothorax and to seek immediate medical attention should they occur.  Patient was educated on signs and symptoms of infection and other risk factors and advised to seek medical attention should they  occur.  Patient verbalized understanding of these instructions and education.   Patient Verbal Consent Given: Yes Education Handout Provided: No pt declined Muscles Treated: R UT Electrical Stimulation Performed: No Treatment Response/Outcome: Decreased pain, improved mobility.   - UT soft tissue mobility on R side - Manual pec stretch  - Assessment and STM of lats on R side.  - Doorway pec stretch  - Star gazer stretch - Prone rows - Bilat ER with GTB - UT stretch  - LS stretch  - bent over triceps extension with row  - seated scap retraction     PATIENT EDUCATION:  Education details: Anatomy of condition, POC, HEP, exercise form/rationale Person educated: Patient Education method: Explanation, Demonstration, Tactile cues, Verbal cues, and Handouts Education comprehension: verbalized understanding, returned demonstration, verbal cues required, tactile cues required, and needs further education  HOME EXERCISE PROGRAM: Access Code: [email or web address] URL: [email or web address] Date: 02/15/2024 Prepared by: Keven Pel  Program Notes lay down with core tight and shoulder blade back, inhale to fill upper right lung  Exercises - Seated Scapular Retraction  - 7 x weekly - Supine Shoulder External Rotation with Resistance  - 2-3 x daily - 7 x weekly - 1 sets - 10 reps - Standing Shoulder External Rotation with Resistance  - 2-3 x daily - 7 x weekly - 1 sets - 10 reps - Standing Bent Over on Chair Single Arm Tricep Extension with Dumbbell  - 1 x daily - 7 x weekly - 3 sets - 10 reps - Seated Upper Trapezius Stretch  - 2-3 x daily - 7 x weekly - 2 sets - 3 breaths hold - Standing Bicep Curls with Dumbbells and Rotation  - 1 x daily - 7 x weekly - 3 sets - 10 reps Rt sidelying knee over knee PRI 90/90 hip lift with Left arm reach PRI   ASSESSMENT:  CLINICAL IMPRESSION: Significant assistance required for depression paired with retraction in prone on right side.  Fatigue and difficulty especially when adding shoulder extension. Will focus on sitting wihtout crossing legs and prone retraction until Sat- husband will help perform cross friction to levator. Able to decrease concordant pain with treatment today but will require significantly more training for proper scapular control and postural support.   REHAB POTENTIAL: Good  CLINICAL DECISION MAKING: Stable/uncomplicated  EVALUATION COMPLEXITY: Low   GOALS: Goals reviewed with patient? Yes  SHORT TERM GOALS: Target date: 5/24  Able to demo scapular control with UE MMT Baseline: Goal status: INITIAL  2.  Verbalize ability to correct posture throughout the day, particularly at work with her right arm reaching forward to a mouse/keyboard Baseline:  Goal status: INITIAL  3.  Begin incorporating UE strength back into regular gym workout Baseline:  Goal status: INITIAL    LONG TERM GOALS: Target date: POC date  Able to strengthen upper body during regular workouts with minimal to no discomfort in shoulder Baseline:  Goal status: INITIAL  2.  Quick [place]e Retort to improve by MDC Baseline:  Goal status: INITIAL  3.  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  Able to maintain minimal to no pain throughout regular daily activities Baseline:  Goal status: INITIAL    PLAN:  PT FREQUENCY: 1-2x/week  PT DURATION: POC date  PLANNED INTERVENTIONS: 97164- PT Re-evaluation, 97750- Physical Performance Testing, 97110-Therapeutic exercises, 97530- Therapeutic activity, [id number]- Neuromuscular re-education, 97535- Self Care, 83151- Manual therapy, [phone]- Aquatic Therapy, Patient/Family education, Taping, Dry Needling, Joint mobilization, Spinal mobilization, Scar mobilization, Cryotherapy, and Moist heat.  PLAN FOR NEXT SESSION: STM/DN PRN, review rib cage expansion with breathing   Madison Albea C. Durrel Mcnee PT, DPT 03/04/24 10:59 AM

## 2024-03-04 NOTE — Patient Instructions (Addendum)

## 2024-03-04 NOTE — Progress Notes (Unsigned)
 Subjective:    Patient ID: Madison [name], female    DOB: [birth year], 38 y.o.   MRN: [id number]      HPI Madison [name] is here for a Physical exam and her chronic medical problems.   Overall doing well.  No concerns.   Medications and allergies reviewed with patient and updated if appropriate.  Current Outpatient Medications on File Prior to Visit  Medication Sig Dispense Refill   albuterol  (VENTOLIN  HFA) 108 (90 Base) MCG/ACT inhaler Inhale 2 puffs into the lungs every 4 (four) hours as needed for wheezing or shortness of breath. 8.5 g 2   Cyanocobalamin  (VITAMIN B-12 CR PO) daily.     fexofenadine (ALLEGRA) 30 MG tablet Take 30 mg by mouth daily.      Spacer/Aero-Holding Chambers (AEROCHAMBER PLUS WITH MASK) inhaler Use as directed. 1 each 0   tacrolimus  (PROTOPIC ) 0.1 % ointment Apply to affected areas twice daily for flares 100 g 0   tiZANidine  (ZANAFLEX ) 4 MG tablet Take 1 tablet (4 mg total) by mouth at bedtime. 30 tablet 0   venlafaxine  XR (EFFEXOR  XR) 37.5 MG 24 hr capsule Take 1 capsule (37.5 mg total) by mouth daily with breakfast. 90 capsule 0   Vitamin D , Ergocalciferol , (DRISDOL ) 1.25 MG (50000 UNIT) CAPS capsule Take 1 capsule (50,000 Units total) by mouth every 7 (seven) days. 12 capsule 0   No current facility-administered medications on file prior to visit.    Review of Systems  Constitutional:  Negative for fever.  Eyes:  Negative for visual disturbance.  Respiratory:  Negative for cough, shortness of breath and wheezing.   Cardiovascular:  Negative for chest pain, palpitations and leg swelling.  Gastrointestinal:  Negative for abdominal pain, blood in stool, constipation and diarrhea.       No gerd  Genitourinary:  Negative for dysuria.  Musculoskeletal:  Positive for back pain. Negative for arthralgias.  Skin:  Negative for rash.  Neurological:  Positive for dizziness (occ) and headaches (occ). Negative for light-headedness.  Psychiatric/Behavioral:   Positive for sleep disturbance (right shoulder pain). Negative for dysphoric mood. The patient is nervous/anxious.        Objective:   Vitals:   03/05/24 0826  BP: 100/62  Pulse: 72  Temp: 98 F (36.7 C)  SpO2: 96%   Filed Weights   03/05/24 0826  Weight: 132 lb (59.9 kg)   Body mass index is 23.38 kg/m.  BP Readings from Last 3 Encounters:  03/05/24 100/62  02/11/24 102/72  12/31/23 106/88    Wt Readings from Last 3 Encounters:  03/05/24 132 lb (59.9 kg)  02/11/24 133 lb (60.3 kg)  12/31/23 137 lb (62.1 kg)       Physical Exam Constitutional: She appears well-developed and well-nourished. No distress.  HENT:  Head: Normocephalic and atraumatic.  Right Ear: External ear normal. Normal ear canal and TM Left Ear: External ear normal.  Normal ear canal and TM Mouth/Throat: Oropharynx is clear and moist.  Eyes: Conjunctivae normal.  Neck: Neck supple. No tracheal deviation present. No thyromegaly present.  No carotid bruit  Cardiovascular: Normal rate, regular rhythm and normal heart sounds.   No murmur heard.  No edema. Pulmonary/Chest: Effort normal and breath sounds normal. No respiratory distress. She has no wheezes. She has no rales.  Breast: deferred   Abdominal: Soft. She exhibits no distension. There is no tenderness.  Lymphadenopathy: She has no cervical adenopathy.  Skin: Skin is warm and dry. She is not diaphoretic.  Psychiatric: She has a normal mood and affect. Her behavior is normal.     Lab Results  Component Value Date   WBC 9.0 10/22/2023   HGB 13.6 10/22/2023   HCT 40.9 10/22/2023   PLT 296.0 10/22/2023   GLUCOSE 84 10/22/2023   CHOL 198 02/27/2023   TRIG 130.0 02/27/2023   HDL 55.40 02/27/2023   LDLCALC 117 (H) 02/27/2023   ALT 14 10/22/2023   AST 15 10/22/2023   NA 139 10/22/2023   K 4.4 10/22/2023   CL 104 10/22/2023   CREATININE 0.64 10/22/2023   BUN 11 10/22/2023   CO2 27 10/22/2023   TSH 0.82 10/22/2023   HGBA1C 4.7  02/02/2020         Assessment & Plan:   Physical exam: Screening blood work  ordered Exercise  5-6 times a week - gym - cardio/weights Weight  normal Substance abuse  none   Reviewed recommended immunizations.   Health Maintenance  Topic Date Due   Cervical Cancer Screening (HPV/Pap Cotest)  12/17/2021   COVID-19 Vaccine (10 - Mixed Product risk 2024-25 season) 03/20/2024 (Originally 02/21/2024)   Pneumococcal Vaccine 11-62 Years old (1 of 2 - PCV) 03/05/2025 (Originally 02/13/2004)   INFLUENZA VACCINE  05/23/2024   DTaP/Tdap/Td (3 - Td or Tdap) 07/27/2032   Hepatitis C Screening  Completed   HIV Screening  Completed   HPV VACCINES  Aged Out   Meningococcal B Vaccine  Aged Out          See Problem List for Assessment and Plan of chronic medical problems.

## 2024-03-05 ENCOUNTER — Ambulatory Visit: Payer: Self-pay | Admitting: Internal Medicine

## 2024-03-05 ENCOUNTER — Ambulatory Visit (INDEPENDENT_AMBULATORY_CARE_PROVIDER_SITE_OTHER): Admitting: Internal Medicine

## 2024-03-05 VITALS — BP 100/62 | HR 72 | Temp 98.0°F | Ht 63.0 in | Wt 132.0 lb

## 2024-03-05 DIAGNOSIS — E538 Deficiency of other specified B group vitamins: Secondary | ICD-10-CM

## 2024-03-05 DIAGNOSIS — E782 Mixed hyperlipidemia: Secondary | ICD-10-CM | POA: Diagnosis not present

## 2024-03-05 DIAGNOSIS — E559 Vitamin D deficiency, unspecified: Secondary | ICD-10-CM

## 2024-03-05 DIAGNOSIS — Z0000 Encounter for general adult medical examination without abnormal findings: Secondary | ICD-10-CM | POA: Diagnosis not present

## 2024-03-05 DIAGNOSIS — L508 Other urticaria: Secondary | ICD-10-CM | POA: Diagnosis not present

## 2024-03-05 DIAGNOSIS — J4520 Mild intermittent asthma, uncomplicated: Secondary | ICD-10-CM | POA: Diagnosis not present

## 2024-03-05 LAB — CBC WITH DIFFERENTIAL/PLATELET
Basophils Absolute: 0 10*3/uL (ref 0.0–0.1)
Basophils Relative: 0.6 % (ref 0.0–3.0)
Eosinophils Absolute: 0.2 10*3/uL (ref 0.0–0.7)
Eosinophils Relative: 1.9 % (ref 0.0–5.0)
HCT: 43 % (ref 36.0–46.0)
Hemoglobin: 14.1 g/dL (ref 12.0–15.0)
Lymphocytes Relative: 23.6 % (ref 12.0–46.0)
Lymphs Abs: 1.9 10*3/uL (ref 0.7–4.0)
MCHC: 32.8 g/dL (ref 30.0–36.0)
MCV: 94.4 fl (ref 78.0–100.0)
Monocytes Absolute: 0.7 10*3/uL (ref 0.1–1.0)
Monocytes Relative: 8.2 % (ref 3.0–12.0)
Neutro Abs: 5.4 10*3/uL (ref 1.4–7.7)
Neutrophils Relative %: 65.7 % (ref 43.0–77.0)
Platelets: 259 10*3/uL (ref 150.0–400.0)
RBC: 4.56 Mil/uL (ref 3.87–5.11)
RDW: 13.5 % (ref 11.5–15.5)
WBC: 8.3 10*3/uL (ref 4.0–10.5)

## 2024-03-05 LAB — COMPREHENSIVE METABOLIC PANEL WITH GFR
ALT: 14 U/L (ref 0–35)
AST: 15 U/L (ref 0–37)
Albumin: 4.4 g/dL (ref 3.5–5.2)
Alkaline Phosphatase: 47 U/L (ref 39–117)
BUN: 9 mg/dL (ref 6–23)
CO2: 28 meq/L (ref 19–32)
Calcium: 9.4 mg/dL (ref 8.4–10.5)
Chloride: 103 meq/L (ref 96–112)
Creatinine, Ser: 0.62 mg/dL (ref 0.40–1.20)
GFR: 112.46 mL/min (ref >60.00)
Glucose, Bld: 86 mg/dL (ref 70–99)
Potassium: 4.2 meq/L (ref 3.5–5.1)
Sodium: 139 meq/L (ref 135–145)
Total Bilirubin: 0.4 mg/dL (ref 0.2–1.2)
Total Protein: 6.9 g/dL (ref 6.0–8.3)

## 2024-03-05 LAB — LIPID PANEL
Cholesterol: 174 mg/dL (ref 0–200)
HDL: 53.9 mg/dL (ref >39.00)
LDL Cholesterol: 111 mg/dL — ABNORMAL HIGH (ref 0–99)
NonHDL: 120.42
Total CHOL/HDL Ratio: 3
Triglycerides: 47 mg/dL (ref 0.0–149.0)
VLDL: 9.4 mg/dL (ref 0.0–40.0)

## 2024-03-05 LAB — VITAMIN B12: Vitamin B-12: 1077 pg/mL — ABNORMAL HIGH (ref 211–911)

## 2024-03-05 LAB — TSH: TSH: 0.65 u[IU]/mL (ref 0.35–5.50)

## 2024-03-05 NOTE — Assessment & Plan Note (Signed)
Chronic ?Mild, intermittent ?Allergies are main trigger ?Albuterol inhaler prn but rarely uses it ?

## 2024-03-05 NOTE — Assessment & Plan Note (Signed)
 Chronic Taking oral B12 daily Ck B12 level

## 2024-03-05 NOTE — Assessment & Plan Note (Signed)
Chronic Check lipid panel, cmp, cbc, tsh Continue lifestyle control Regular exercise and healthy diet encouraged

## 2024-03-05 NOTE — Assessment & Plan Note (Signed)
 Chronic Currently on high-dose vitamin D 

## 2024-03-05 NOTE — Assessment & Plan Note (Addendum)
 Chronic Intermittent flares Predominantly face, neck, chest, back Taking allegra daily Discussed that she can take an extra Allegra or Pepcid  as needed for few days lipid flare which may help control it faster

## 2024-03-06 DIAGNOSIS — F419 Anxiety disorder, unspecified: Secondary | ICD-10-CM | POA: Diagnosis not present

## 2024-03-08 ENCOUNTER — Encounter (HOSPITAL_BASED_OUTPATIENT_CLINIC_OR_DEPARTMENT_OTHER): Payer: Self-pay | Admitting: Physical Therapy

## 2024-03-08 ENCOUNTER — Ambulatory Visit (HOSPITAL_BASED_OUTPATIENT_CLINIC_OR_DEPARTMENT_OTHER): Admitting: Physical Therapy

## 2024-03-08 DIAGNOSIS — G8929 Other chronic pain: Secondary | ICD-10-CM

## 2024-03-08 DIAGNOSIS — M25511 Pain in right shoulder: Secondary | ICD-10-CM | POA: Diagnosis not present

## 2024-03-08 DIAGNOSIS — R293 Abnormal posture: Secondary | ICD-10-CM | POA: Diagnosis not present

## 2024-03-08 NOTE — Therapy (Signed)
 OUTPATIENT PHYSICAL THERAPY TREATMENT   Patient Name: Madison [name] MRN: [id number] DOB:Jan 05, 1986, 38 y.o., female Today's Date: 03/08/2024  END OF SESSION:  PT End of Session - 03/08/24 0834     Visit Number 5    Number of Visits 25    Date for PT Re-Evaluation 05/09/24    Authorization Type [facility] [employer]    PT Start Time [phone]    PT Stop Time 0915    PT Time Calculation (min) 42 min    Activity Tolerance Patient tolerated treatment well    Behavior During Therapy [facility] for tasks assessed/performed                Past Medical History:  Diagnosis Date   Allergy    Asthma    Hx of migraines 01/012003   can tell when they come on, treats with excedrin migraine.   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   Chronic urticaria 05/11/2021   Other adverse food reactions, not elsewhere classified, subsequent encounter 05/11/2021   Seasonal and perennial allergic rhinitis 05/11/2021   Mild intermittent asthma without complication 02/02/2021   Trigger point of right shoulder region 04/07/2020   Nonallopathic lesion of lumbosacral region 11/07/2018   Nonallopathic lesion of sacral region 11/07/2018   Nonallopathic lesion of thoracic region 11/07/2018   Volar plate injury of finger 01/22/2018   Scoliosis 08/21/2017    REFERRING PROVIDER: Grafton Lawrence, MD  REFERRING DIAG: Rt shoulder posterior labrum  tear  Rationale for Evaluation and Treatment: Rehabilitation  THERAPY DIAG:  Chronic right shoulder pain  Abnormal posture  ONSET DATE: chronic back pain with subacute on chronic shoulder pain (March 2024)   SUBJECTIVE:                                                                                                                                                                                           SUBJECTIVE STATEMENT: I was super sore. Heat provides great relief. Occipital HA. I think the amount of DN we did  was super intense.   EVAL: Have been seeing Dr Felipe Horton for about 6 years due to scoliosis. Neck pain without N/T. Rt-handed.   PERTINENT HISTORY:  Scoliosis with fusions that began at 38 yo   PAIN:  Are you having pain? Yes: NPRS scale: 3 at rest, 6 at worst Pain location: Rt shoulder into neck Pain description: ache Aggravating factors: dressing/undressing- rotation motion, laying on the shoulder, aches by end of day at computer Relieving factors: ice  PRECAUTIONS:  None  RED FLAGS: None   WEIGHT BEARING RESTRICTIONS:  No  FALLS:  Has patient fallen in last 6 months? No  OCCUPATION:  [employer]  PLOF:  Independent  PATIENT GOALS:  Avoid surgery, decr pain   OBJECTIVE:  Note: Objective measures were completed at Evaluation unless otherwise noted.  DIAGNOSTIC FINDINGS:  MRI 4/2: IMPRESSION: 1. Findings suggestive of a nondisplaced tear at the base of the posteroinferior labrum. 2. No rotator cuff tear identified.  PATIENT SURVEYS:  Quick Dash 22.73   POSTURE:  Thoracic dextro/lumbar levoscoliosis with Rt shoulder elevation and scapular winging  HAND DOMINANCE:  Right    Body Part #1 Shoulder   UPPER EXTREMITY MMT: EVAL: Able to demo strength hold against resistance with poor scapular control.                                                                                                              TREATMENT  DATE:  Treatment                            5/17: Blank lines following charge title = not provided on this treatment date.   Manual:  TPDN YES Trigger Point Dry Needling  Subsequent Treatment: Instructions provided previously at initial dry needling treatment.   Patient Verbal Consent Given: Yes Education Handout Provided: Previously Provided Muscles Treated: Rt levator scap Electrical Stimulation Performed: No Treatment Response/Outcome: twitch with decreased spams Sidelying scapular mobilizations & distraction Pec minor STM and stretching There-ex:  There-Act:  Self Care:  Nuro-Re-ed: Prone retraction+extension Seated retraction +single arm ER opp anchor- red tband, PT assist in scap motion Performed against wall for HEP Gait Training:   Treatment                            5/13: Blank lines following charge title = not provided on this treatment date.   Manual:  TPDN YES Trigger Point Dry Needling  Subsequent Treatment: Instructions provided previously at initial dry needling treatment.   Patient Verbal Consent Given: Yes Education Handout Provided: Previously Provided Muscles Treated: bilateral upper traps, Rt levator, Rt C7 paraspinals, Rt infraspinatus, Rt rhomboids, bilateral suboccipitals Electrical Stimulation Performed: No Treatment Response/Outcome: twitch with decreased tension Rib mobs, bil first rib depression STM to all muscles DN Cross friction to Rt levator scap There-ex:  There-Act:  Self Care:  Nuro-Re-ed: Prone scap retraction with PT assist on Rt Retraction+GHJ ext in prone Gait  Training:     PATIENT EDUCATION:  Education details: Teacher, music of condition, POC, HEP, exercise form/rationale Person educated: Patient Education method: Explanation, Demonstration, Tactile cues, Verbal cues, and Handouts Education comprehension: verbalized understanding, returned demonstration, verbal cues required, tactile cues required, and needs further  education  HOME EXERCISE PROGRAM: Access Code: [email or web address] URL: [email or web address] Date: 02/15/2024 Prepared by: Keven Pel  Program Notes lay down with core tight and shoulder blade back, inhale to fill upper right lung  Exercises - Seated Scapular Retraction  - 7 x weekly - Supine Shoulder External Rotation with Resistance  - 2-3 x daily - 7 x weekly - 1 sets - 10 reps - Standing Shoulder External Rotation with Resistance  - 2-3 x daily - 7 x weekly - 1 sets - 10 reps - Standing Bent Over on Chair Single Arm Tricep Extension with Dumbbell  - 1 x daily - 7 x weekly - 3 sets - 10 reps - Seated Upper Trapezius Stretch  - 2-3 x daily - 7 x weekly - 2 sets - 3 breaths hold - Standing Bicep Curls with Dumbbells and Rotation  - 1 x daily - 7 x weekly - 3 sets - 10 reps Rt sidelying knee over knee PRI 90/90 hip lift with Left arm reach PRI   ASSESSMENT:  CLINICAL IMPRESSION: Continued improvement in scapular glide but does require assistance and tactile cuing. Will continue to progress controlled motion at next f/u as long as mobility is maintained. Decreased use of DN today as soreness was greater than ideal after last appt.   REHAB POTENTIAL: Good  CLINICAL DECISION MAKING: Stable/uncomplicated  EVALUATION COMPLEXITY: Low   GOALS: Goals reviewed with patient? Yes  SHORT TERM GOALS: Target date: 5/24  Able to demo scapular control with UE MMT Baseline: Goal status: INITIAL  2.  Verbalize ability to correct posture throughout the day, particularly at work with her right arm reaching forward to a mouse/keyboard Baseline:  Goal status: INITIAL  3.  Begin incorporating UE strength back into regular gym workout Baseline:  Goal status: INITIAL    LONG TERM GOALS: Target date: POC date  Able to strengthen upper body during regular workouts with minimal to no discomfort in shoulder Baseline:  Goal status: INITIAL  2.  Quick [place]e Retort to improve by  MDC Baseline:  Goal status: INITIAL  3.  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  Able to maintain minimal to no pain throughout regular daily activities Baseline:  Goal status: INITIAL    PLAN:  PT FREQUENCY: 1-2x/week  PT DURATION: POC date  PLANNED INTERVENTIONS: 97164- PT Re-evaluation, 97750- Physical Performance Testing, 97110-Therapeutic exercises, 97530- Therapeutic activity, [id number]- Neuromuscular re-education, 97535- Self Care, 16109- Manual therapy, [id number]- Aquatic Therapy, Patient/Family education, Taping, Dry Needling, Joint mobilization, Spinal mobilization, Scar mobilization, Cryotherapy, and Moist heat.  PLAN FOR NEXT SESSION: STM/DN PRN, review rib cage expansion with breathing   Coleton Woon C. Daijha Leggio PT, DPT 03/08/24 10:54 AM

## 2024-03-11 ENCOUNTER — Encounter (HOSPITAL_BASED_OUTPATIENT_CLINIC_OR_DEPARTMENT_OTHER): Payer: Self-pay | Admitting: Physical Therapy

## 2024-03-11 ENCOUNTER — Ambulatory Visit (HOSPITAL_BASED_OUTPATIENT_CLINIC_OR_DEPARTMENT_OTHER): Admitting: Physical Therapy

## 2024-03-11 DIAGNOSIS — M25511 Pain in right shoulder: Secondary | ICD-10-CM | POA: Diagnosis not present

## 2024-03-11 DIAGNOSIS — G8929 Other chronic pain: Secondary | ICD-10-CM

## 2024-03-11 DIAGNOSIS — R293 Abnormal posture: Secondary | ICD-10-CM | POA: Diagnosis not present

## 2024-03-11 NOTE — Therapy (Signed)
 OUTPATIENT PHYSICAL THERAPY TREATMENT   Patient Name: Madison [name] MRN: [id number] DOB:[birth year], 38 y.o., female Today's Date: 03/11/2024  END OF SESSION:  PT End of Session - 03/11/24 0800     Visit Number 6    Number of Visits 25    Date for PT Re-Evaluation 05/09/24    Authorization Type [facility] [employer]    PT Start Time [phone]    PT Stop Time 0844    PT Time Calculation (min) 46 min    Activity Tolerance Patient tolerated treatment well    Behavior During Therapy [facility] for tasks assessed/performed                Past Medical History:  Diagnosis Date   Allergy    Asthma    Hx of migraines 01/012003   can tell when they come on, treats with excedrin migraine.   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   Chronic urticaria 05/11/2021   Other adverse food reactions, not elsewhere classified, subsequent encounter 05/11/2021   Seasonal and perennial allergic rhinitis 05/11/2021   Mild intermittent asthma without complication 02/02/2021   Trigger point of right shoulder region 04/07/2020   Nonallopathic lesion of lumbosacral region 11/07/2018   Nonallopathic lesion of sacral region 11/07/2018   Nonallopathic lesion of thoracic region 11/07/2018   Volar plate injury of finger 01/22/2018   Scoliosis 08/21/2017    REFERRING PROVIDER: Grafton Lawrence, MD  REFERRING DIAG: Rt shoulder posterior labrum  tear  Rationale for Evaluation and Treatment: Rehabilitation  THERAPY DIAG:  Chronic right shoulder pain  Abnormal posture  ONSET DATE: chronic back pain with subacute on chronic shoulder pain (March 2024)   SUBJECTIVE:                                                                                                                                                                                           SUBJECTIVE STATEMENT: Sore in right upper trap and between shoulder blade-spine.  EVAL: Have been seeing Dr Felipe Horton for about  6 years due to scoliosis. Neck pain without N/T. Rt-handed.   PERTINENT HISTORY:  Scoliosis with fusions that began at 38 yo   PAIN:  Are you having pain? Yes: NPRS scale: 3 at rest, 6 at worst Pain location: Rt shoulder into neck Pain description: ache Aggravating factors: dressing/undressing- rotation motion, laying on the shoulder, aches by end of day at computer Relieving factors: ice  PRECAUTIONS:  None  RED FLAGS: None   WEIGHT BEARING RESTRICTIONS:  No  FALLS:  Has patient fallen in last 6 months? No  OCCUPATION:  [employer] for [employer]  PLOF:  Independent  PATIENT GOALS:  Avoid surgery, decr pain   OBJECTIVE:  Note: Objective measures were completed at Evaluation unless otherwise noted.  DIAGNOSTIC FINDINGS:  MRI 4/2: IMPRESSION: 1. Findings suggestive of a nondisplaced tear at the base of the posteroinferior labrum. 2. No rotator cuff tear identified.  PATIENT SURVEYS:  Quick Dash 22.73   POSTURE:  Thoracic dextro/lumbar levoscoliosis with Rt shoulder elevation and scapular winging  HAND DOMINANCE:  Right    Body Part #1 Shoulder   UPPER EXTREMITY MMT: EVAL: Able to demo strength hold against resistance with poor scapular control.                                                                                                              TREATMENT DATE: Treatment                            5/20: Blank  lines following charge title = not provided on this treatment date.   Manual:  TPDN No Prone STM upper trap, levator, infraspinatus, supraspinatus, terres, rhomboids, mid trap Prone scap mobilization Prone gross rib mobilizations on rt upper quadrant There-ex: Prone retraction, and with UE ext- PT assist in scap retraction/depression GHJ ER against wall for tactile cue of scap motion ER+abd press against wall   Y against wall  There-Act:  Self Care:  Nuro-Re-ed:  Gait Training:     Treatment                            5/17: Blank lines following charge title = not provided on this treatment date.   Manual:  TPDN YES Trigger Point Dry Needling  Subsequent Treatment: Instructions provided previously at initial dry needling treatment.   Patient Verbal Consent Given: Yes Education Handout Provided: Previously Provided Muscles Treated: Rt levator scap Electrical Stimulation Performed: No Treatment Response/Outcome: twitch with decreased spams Sidelying scapular mobilizations & distraction Pec minor STM and stretching There-ex:  There-Act:  Self Care:  Nuro-Re-ed: Prone retraction+extension Seated retraction +single arm ER opp anchor- red tband, PT assist in scap motion Performed against wall for HEP Gait Training:       PATIENT EDUCATION:  Education details: Anatomy of condition, POC, HEP, exercise form/rationale Person educated: Patient Education method: Explanation, Demonstration, Tactile cues, Verbal cues, and Handouts Education comprehension: verbalized understanding, returned demonstration, verbal cues required,  tactile cues required, and needs further education  HOME EXERCISE PROGRAM: Access Code: [email or web address] URL: [email or web address] Date: 02/15/2024 Prepared by: Keven Pel  Program Notes lay down with core tight and shoulder blade back, inhale to fill upper right lung  Exercises - Seated Scapular Retraction  - 7 x weekly -  Supine Shoulder External Rotation with Resistance  - 2-3 x daily - 7 x weekly - 1 sets - 10 reps - Standing Shoulder External Rotation with Resistance  - 2-3 x daily - 7 x weekly - 1 sets - 10 reps - Standing Bent Over on Chair Single Arm Tricep Extension with Dumbbell  - 1 x daily - 7 x weekly - 3 sets - 10 reps - Seated Upper Trapezius Stretch  - 2-3 x daily - 7 x weekly - 2 sets - 3 breaths hold - Standing Bicep Curls with Dumbbells and Rotation  - 1 x daily - 7 x weekly - 3 sets - 10 reps Rt sidelying knee over knee PRI 90/90 hip lift with Left arm reach PRI   ASSESSMENT:  CLINICAL IMPRESSION: Use of wall for tactile cuing for proper motion of scapula with goal of strength in periscapular region rather than use of spasm for support. Multiple cavitations and rolling of subscap noted without c/o pain.   REHAB POTENTIAL: Good  CLINICAL DECISION MAKING: Stable/uncomplicated  EVALUATION COMPLEXITY: Low   GOALS: Goals reviewed with patient? Yes  SHORT TERM GOALS: Target date: 5/24  Able to demo scapular control with UE MMT Baseline: Goal status: INITIAL  2.  Verbalize ability to correct posture throughout the day, particularly at work with her right arm reaching forward to a mouse/keyboard Baseline:  Goal status: INITIAL  3.  Begin incorporating UE strength back into regular gym workout Baseline:  Goal status: INITIAL    LONG TERM GOALS: Target date: POC date  Able to strengthen upper body during regular workouts with minimal to no discomfort in shoulder Baseline:  Goal status: INITIAL  2.  Quick [place]e Retort to improve by MDC Baseline:  Goal status: INITIAL  3.  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  Able to maintain minimal to no pain throughout regular daily activities Baseline:  Goal status: INITIAL    PLAN:  PT FREQUENCY: 1-2x/week  PT DURATION: POC date  PLANNED INTERVENTIONS: 97164- PT  Re-evaluation, 97750- Physical Performance Testing, 97110-Therapeutic exercises, 97530- Therapeutic activity, [id number]- Neuromuscular re-education, 97535- Self Care, 54098- Manual therapy, [phone]- Aquatic Therapy, Patient/Family education, Taping, Dry Needling, Joint mobilization, Spinal mobilization, Scar mobilization, Cryotherapy, and Moist heat.  PLAN FOR NEXT SESSION: STM/DN PRN, review rib cage expansion with breathing   Koraima Albertsen C. Evgenia Merriman PT, DPT 03/11/24 8:46 AM

## 2024-03-13 DIAGNOSIS — F419 Anxiety disorder, unspecified: Secondary | ICD-10-CM | POA: Diagnosis not present

## 2024-03-19 ENCOUNTER — Encounter (HOSPITAL_BASED_OUTPATIENT_CLINIC_OR_DEPARTMENT_OTHER): Payer: Self-pay | Admitting: Physical Therapy

## 2024-03-19 ENCOUNTER — Ambulatory Visit (HOSPITAL_BASED_OUTPATIENT_CLINIC_OR_DEPARTMENT_OTHER): Admitting: Physical Therapy

## 2024-03-19 DIAGNOSIS — R293 Abnormal posture: Secondary | ICD-10-CM | POA: Diagnosis not present

## 2024-03-19 DIAGNOSIS — G8929 Other chronic pain: Secondary | ICD-10-CM | POA: Diagnosis not present

## 2024-03-19 DIAGNOSIS — M25511 Pain in right shoulder: Secondary | ICD-10-CM | POA: Diagnosis not present

## 2024-03-19 NOTE — Therapy (Signed)
 OUTPATIENT PHYSICAL THERAPY TREATMENT   Patient Name: Madison [name] MRN: [id number] DOB:Apr 21, 1986, 38 y.o., female Today's Date: 03/19/2024  END OF SESSION:  PT End of Session - 03/19/24 0842     Visit Number 7    Number of Visits 25    Date for PT Re-Evaluation 05/09/24    Authorization Type [facility] [employer]    PT Start Time [phone]    PT Stop Time 0930    PT Time Calculation (min) 49 min    Activity Tolerance Patient tolerated treatment well    Behavior During Therapy [facility] for tasks assessed/performed                Past Medical History:  Diagnosis Date   Allergy    Asthma    Hx of migraines 01/012003   can tell when they come on, treats with excedrin migraine.   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   Chronic urticaria 05/11/2021   Other adverse food reactions, not elsewhere classified, subsequent encounter 05/11/2021   Seasonal and perennial allergic rhinitis 05/11/2021   Mild intermittent asthma without complication 02/02/2021   Trigger point of right shoulder region 04/07/2020   Nonallopathic lesion of lumbosacral region 11/07/2018   Nonallopathic lesion of sacral region 11/07/2018   Nonallopathic lesion of thoracic region 11/07/2018   Volar plate injury of finger 01/22/2018   Scoliosis 08/21/2017    REFERRING PROVIDER: Grafton Lawrence, MD  REFERRING DIAG: Rt shoulder posterior labrum  tear  Rationale for Evaluation and Treatment: Rehabilitation  THERAPY DIAG:  Chronic right shoulder pain  Abnormal posture  ONSET DATE: chronic back pain with subacute on chronic shoulder pain (March 2024)   SUBJECTIVE:                                                                                                                                                                                           SUBJECTIVE STATEMENT: Did a lot of cooking this weekend so I am sore, achey. Upper trap is tight and between  the scap and spine.    EVAL: Have been seeing Dr Felipe Horton for about 6 years due to scoliosis. Neck pain without N/T. Rt-handed.   PERTINENT HISTORY:  Scoliosis with fusions that began at 38 yo   PAIN:  Are you having pain? Yes: NPRS scale: 3 at rest, 6 at worst Pain location: Rt shoulder into neck Pain description: ache Aggravating factors: dressing/undressing- rotation motion, laying on the shoulder, aches by end of day at computer Relieving factors: ice  PRECAUTIONS:  None  RED FLAGS: None   WEIGHT BEARING RESTRICTIONS:  No  FALLS:  Has patient fallen in last 6 months? No  OCCUPATION:  [employer]  PLOF:  Independent  PATIENT GOALS:  Avoid surgery, decr pain   OBJECTIVE:  Note: Objective measures were completed at Evaluation unless otherwise noted.  DIAGNOSTIC FINDINGS:  MRI 4/2: IMPRESSION: 1. Findings suggestive of a nondisplaced tear at the base of the posteroinferior labrum. 2. No rotator cuff tear identified.  PATIENT SURVEYS:  Quick Dash 22.73   POSTURE:  Thoracic dextro/lumbar levoscoliosis with Rt shoulder elevation and scapular winging  HAND DOMINANCE:  Right    Body Part #1 Shoulder   UPPER EXTREMITY MMT: EVAL: Able to demo strength hold against resistance with poor scapular control.                                                                                                              TREATMENT  DATE: Treatment                            5/28: Blank lines following charge title = not provided on this treatment date.   Manual:  TPDN YES Trigger Point Dry Needling  Subsequent Treatment: Instructions provided previously at initial dry needling treatment.   Patient Verbal Consent Given: Yes Education Handout Provided: Previously Provided Muscles Treated: Rt upper trap & levator Electrical Stimulation Performed: No Treatment Response/Outcome: twitch with decreased tension STM Rt rhomboids, infraspinatus, Tx paraspinals, subscap, lats Sidelying scap mobilizations Ktape Rt upper trap inhibition There-ex:  There-Act:  Self Care:  Nuro-Re-ed: Scap retraction with tactile cues Qped protraction  + UE reach Tall kneel Y on airex with glut/core set Gait Training:    Treatment                            5/20: Blank lines following charge title = not provided on this treatment date.   Manual:  TPDN No Prone STM upper trap, levator, infraspinatus, supraspinatus, terres, rhomboids, mid trap Prone scap mobilization Prone gross rib mobilizations on rt upper quadrant There-ex: Prone retraction, and with UE ext- PT assist in scap retraction/depression GHJ ER against wall for tactile cue of scap motion ER+abd press against wall   Y against wall  There-Act:  Self Care:  Nuro-Re-ed:  Gait Training:     PATIENT EDUCATION:  Education details: Anatomy of condition, POC, HEP, exercise form/rationale Person educated: Patient Education  method: Explanation, Demonstration, Tactile cues, Verbal cues, and Handouts Education comprehension: verbalized understanding, returned demonstration, verbal cues required, tactile cues required, and needs further education  HOME EXERCISE PROGRAM: Access Code: [email or web address] URL: [email or web address] Date: 02/15/2024 Prepared by: Keven Pel  Program Notes lay down with core tight and shoulder blade back, inhale to fill  upper right lung  Rt sidelying knee over knee PRI 90/90 hip lift with Left arm reach PRI   ASSESSMENT:  CLINICAL IMPRESSION: Improving scapular mobility with lateral-most portion with biggest limitation. Rt T6 TTP and will mention to MD- suspect it is from increased rib mobility resulting from scapular training.   REHAB POTENTIAL: Good  CLINICAL DECISION MAKING: Stable/uncomplicated  EVALUATION COMPLEXITY: Low   GOALS: Goals reviewed with patient? Yes  SHORT TERM GOALS: Target date: 5/24  Able to demo scapular control with UE MMT Baseline: Goal status: met  2.  Verbalize ability to correct posture throughout the day, particularly at work with her right arm reaching forward to a mouse/keyboard Baseline:  Goal status: met  3.  Begin incorporating UE strength back into regular gym workout Baseline:  Goal status: partially met- low level for continued neuro re-ed    LONG TERM GOALS: Target date: POC date  Able to strengthen upper body during regular workouts with minimal to no discomfort in shoulder Baseline:  Goal status: INITIAL  2.  Quick [place]e Retort to improve by MDC Baseline:  Goal status: INITIAL  3.  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  Able to maintain minimal to no pain throughout regular daily activities Baseline:  Goal status: INITIAL    PLAN:  PT FREQUENCY: 1-2x/week  PT DURATION: POC date  PLANNED INTERVENTIONS: 97164- PT Re-evaluation, 97750- Physical Performance Testing, 97110-Therapeutic exercises, 97530- Therapeutic activity, [id number]- Neuromuscular re-education, 97535- Self Care, 54098- Manual therapy, [phone]- Aquatic Therapy, Patient/Family education, Taping, Dry Needling, Joint mobilization, Spinal mobilization, Scar mobilization, Cryotherapy, and Moist heat.  PLAN FOR NEXT SESSION: STM/DN PRN, continue length and activation along the Rt to Lt post diagonal chain   Tad Fancher C.  Shirleen Mcfaul PT, DPT 03/19/24 9:42 AM

## 2024-03-20 NOTE — Progress Notes (Unsigned)
 [facility] [address] [place] [address] Phone: [phone] Subjective:   Madison [name], am serving as a scribe for Dr. Ronnell Coins.  I'm seeing this patient by the request  of:  Colene Dauphin, MD  CC: Back and neck pain follow-up  [id number]  Madison [name] is a 38 y.o. female coming in with complaint of back and neck pain. OMT 02/11/2024. Patient states that her R shoulder is no longer aching. Does have soreness in R scapula. Having muscle spasms up into her neck. Pain stays at 4/10. Only time she has relief is when she has heat over neck.   Medications patient has been prescribed: Zanaflex , Effexor , Vit D  Taking: Yes but does not feel that it is enough at the moment.         Reviewed prior external information including notes and imaging from previsou exam, outside providers and external EMR if available.   As well as notes that were available from care everywhere and other healthcare systems.  Past medical history, social, surgical and family history all reviewed in electronic medical record.  No pertanent information unless stated regarding to the chief complaint.   Past Medical History:  Diagnosis Date   Allergy    Asthma    Hx of migraines 01/012003   can tell when they come on, treats with excedrin migraine.   Scoliosis 10/24/1995   Vitamin D  deficiency     Allergies  Allergen Reactions   Kiwi Extract Anaphylaxis   Prednisone      Tingling in hands and face   Meloxicam      heartburn     Review of Systems:  No headache, visual changes, nausea, vomiting, diarrhea, constipation, dizziness, abdominal pain, skin rash, fevers, chills, night sweats, weight loss, swollen lymph nodes, body aches, joint swelling, chest pain, shortness of breath, mood changes. POSITIVE muscle aches  Objective  Blood pressure 102/68, pulse 72, height 5\' 3"  (1.6 m), weight 135 lb (61.2 kg), SpO2 98%.   General: No apparent  distress alert and oriented x3 mood and affect normal, dressed appropriately.  HEENT: Pupils equal, extraocular movements intact  Respiratory: Patient's speak in full sentences and does not appear short of breath  Cardiovascular: No lower extremity edema, non tender, no erythema  Neck exam does have significant tightness.  Significant scoliosis is noted of course.  Sidebending noted.  Positive Spurling's noted with radicular symptoms in the C6 distribution  Osteopathic findings  C6 flexed rotated and side bent right T3 extended rotated and side bent right inhaled rib T9 extended rotated and side bent left L2 flexed rotated and side bent right L5 flexed rotated and side bent left Sacrum right on right       Assessment and Plan:  Scoliosis Discussed with patient at great length.  Concerned with some of the radicular symptoms patient is having.  New radicular symptoms.  Patient is having weakness even in the hand at the moment with 3 out of 5 strength in the C5-C6 distribution.  Do believe that there is a possibility for nerve root impingement and could be a candidate for potential epidurals.  MRI of the spine ordered.  Patient has failed all formal physical therapy and multiple medications.  Will think recent Effexor  to 75 mg.  Imaging to discuss further.    Nonallopathic problems  Decision today to treat with OMT was based on Physical Exam  After verbal consent patient was treated with HVLA, ME, FPR techniques  in cervical, rib, thoracic, lumbar, and sacral  areas avoided any HVLA on the cervical spine secondary to radicular symptoms  Patient tolerated the procedure well with improvement in symptoms  Patient given exercises, stretches and lifestyle modifications  See medications in patient instructions if given  Patient will follow up in 4-8 weeks    The above documentation has been reviewed and is accurate and complete Madison Margo, DO          Note: This dictation  was prepared with Dragon dictation along with smaller phrase technology. Any transcriptional errors that result from this process are unintentional.

## 2024-03-21 ENCOUNTER — Other Ambulatory Visit (HOSPITAL_BASED_OUTPATIENT_CLINIC_OR_DEPARTMENT_OTHER): Payer: Self-pay

## 2024-03-21 ENCOUNTER — Encounter: Payer: Self-pay | Admitting: Family Medicine

## 2024-03-21 ENCOUNTER — Ambulatory Visit: Admitting: Family Medicine

## 2024-03-21 VITALS — BP 102/68 | HR 72 | Ht 63.0 in | Wt 135.0 lb

## 2024-03-21 DIAGNOSIS — M542 Cervicalgia: Secondary | ICD-10-CM

## 2024-03-21 DIAGNOSIS — F419 Anxiety disorder, unspecified: Secondary | ICD-10-CM | POA: Diagnosis not present

## 2024-03-21 DIAGNOSIS — M9908 Segmental and somatic dysfunction of rib cage: Secondary | ICD-10-CM

## 2024-03-21 DIAGNOSIS — M9902 Segmental and somatic dysfunction of thoracic region: Secondary | ICD-10-CM | POA: Diagnosis not present

## 2024-03-21 DIAGNOSIS — M9904 Segmental and somatic dysfunction of sacral region: Secondary | ICD-10-CM | POA: Diagnosis not present

## 2024-03-21 DIAGNOSIS — M9901 Segmental and somatic dysfunction of cervical region: Secondary | ICD-10-CM

## 2024-03-21 DIAGNOSIS — M9903 Segmental and somatic dysfunction of lumbar region: Secondary | ICD-10-CM

## 2024-03-21 DIAGNOSIS — M41125 Adolescent idiopathic scoliosis, thoracolumbar region: Secondary | ICD-10-CM | POA: Diagnosis not present

## 2024-03-21 MED ORDER — VENLAFAXINE HCL ER 75 MG PO CP24
75.0000 mg | ORAL_CAPSULE | Freq: Every day | ORAL | 0 refills | Status: DC
  Filled 2024-03-21: qty 90, 90d supply, fill #0

## 2024-03-21 MED ORDER — DIAZEPAM 5 MG PO TABS
5.0000 mg | ORAL_TABLET | Freq: Two times a day (BID) | ORAL | 0 refills | Status: DC | PRN
  Filled 2024-03-21: qty 10, 5d supply, fill #0

## 2024-03-21 NOTE — Assessment & Plan Note (Signed)
 Discussed with patient at great length.  Concerned with some of the radicular symptoms patient is having.  New radicular symptoms.  Patient is having weakness even in the hand at the moment with 3 out of 5 strength in the C5-C6 distribution.  Do believe that there is a possibility for nerve root impingement and could be a candidate for potential epidurals.  MRI of the spine ordered.  Patient has failed all formal physical therapy and multiple medications.  Will think recent Effexor  to 75 mg.  Imaging to discuss further.

## 2024-03-21 NOTE — Patient Instructions (Addendum)
 [facility] Madison [name] Effexor  75mg  prescribed See you again in 5-6 weeks

## 2024-03-23 ENCOUNTER — Ambulatory Visit

## 2024-03-23 DIAGNOSIS — M542 Cervicalgia: Secondary | ICD-10-CM | POA: Diagnosis not present

## 2024-03-23 DIAGNOSIS — M50322 Other cervical disc degeneration at C5-C6 level: Secondary | ICD-10-CM | POA: Diagnosis not present

## 2024-03-23 DIAGNOSIS — M50323 Other cervical disc degeneration at C6-C7 level: Secondary | ICD-10-CM | POA: Diagnosis not present

## 2024-03-23 DIAGNOSIS — M47812 Spondylosis without myelopathy or radiculopathy, cervical region: Secondary | ICD-10-CM | POA: Diagnosis not present

## 2024-03-23 DIAGNOSIS — M4802 Spinal stenosis, cervical region: Secondary | ICD-10-CM | POA: Diagnosis not present

## 2024-03-24 ENCOUNTER — Ambulatory Visit: Payer: Self-pay | Admitting: Family Medicine

## 2024-03-24 ENCOUNTER — Encounter: Payer: Self-pay | Admitting: Family Medicine

## 2024-03-25 ENCOUNTER — Encounter (HOSPITAL_BASED_OUTPATIENT_CLINIC_OR_DEPARTMENT_OTHER): Payer: Self-pay | Admitting: Physical Therapy

## 2024-03-25 ENCOUNTER — Other Ambulatory Visit: Payer: Self-pay

## 2024-03-25 ENCOUNTER — Ambulatory Visit (HOSPITAL_BASED_OUTPATIENT_CLINIC_OR_DEPARTMENT_OTHER): Attending: Orthopaedic Surgery | Admitting: Physical Therapy

## 2024-03-25 DIAGNOSIS — R293 Abnormal posture: Secondary | ICD-10-CM | POA: Diagnosis not present

## 2024-03-25 DIAGNOSIS — G8929 Other chronic pain: Secondary | ICD-10-CM | POA: Diagnosis not present

## 2024-03-25 DIAGNOSIS — M5412 Radiculopathy, cervical region: Secondary | ICD-10-CM

## 2024-03-25 DIAGNOSIS — M25511 Pain in right shoulder: Secondary | ICD-10-CM | POA: Diagnosis not present

## 2024-03-25 NOTE — Therapy (Signed)
 OUTPATIENT PHYSICAL THERAPY TREATMENT   Patient Name: Madison [name] MRN: [id number] DOB:[birth year], 38 y.o., female Today's Date: 03/25/2024  END OF SESSION:  PT End of Session - 03/25/24 0902     Visit Number 8    Number of Visits 25    Date for PT Re-Evaluation 05/09/24    Authorization Type [facility] [employer]    PT Start Time 0800    PT Stop Time 0845    PT Time Calculation (min) 45 min    Activity Tolerance Treatment limited secondary to medical complications (Comment)    Behavior During Therapy [facility] for tasks assessed/performed                 Past Medical History:  Diagnosis Date   Allergy    Asthma    Hx of migraines 01/012003   can tell when they come on, treats with excedrin migraine.   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   Chronic urticaria 05/11/2021   Other adverse food reactions, not elsewhere classified, subsequent encounter 05/11/2021   Seasonal and perennial allergic rhinitis 05/11/2021   Mild intermittent asthma without complication 02/02/2021   Trigger point of right shoulder region 04/07/2020   Nonallopathic lesion of lumbosacral region 11/07/2018   Nonallopathic lesion of sacral region 11/07/2018   Nonallopathic lesion of thoracic region 11/07/2018   Volar plate injury of finger 01/22/2018   Scoliosis 08/21/2017    REFERRING PROVIDER: Grafton Lawrence, MD  REFERRING DIAG:  Rt shoulder posterior labrum tear  Rationale for Evaluation and Treatment: Rehabilitation  THERAPY DIAG:  Chronic right shoulder pain  Abnormal posture  ONSET DATE: chronic back pain with subacute on chronic shoulder pain (March 2024)   SUBJECTIVE:                                                                                                                                                                                           SUBJECTIVE STATEMENT: MRI results are in.    EVAL: Have been seeing Dr Felipe Horton for  about 6 years due to scoliosis. Neck pain without N/T. Rt-handed.   PERTINENT HISTORY:  Scoliosis with fusions that began at 38 yo   PAIN:  Are you having pain? Yes: NPRS scale: 3 at rest, 6 at worst Pain location: Rt shoulder into neck Pain description: ache Aggravating factors: dressing/undressing- rotation motion, laying on the shoulder, aches by end of day at computer Relieving factors: ice  PRECAUTIONS:  None  RED FLAGS: None   WEIGHT BEARING RESTRICTIONS:  No  FALLS:  Has patient fallen in last 6 months? No  OCCUPATION:  [employer] for [employer]  PLOF:  Independent  PATIENT GOALS:  Avoid surgery, decr pain   OBJECTIVE:  Note: Objective measures were completed at Evaluation unless otherwise noted.  DIAGNOSTIC FINDINGS:  MRI 01/23/24: IMPRESSION: 1. Findings suggestive of a nondisplaced tear at the base of the posteroinferior labrum. 2. No rotator cuff tear identified. MRI 03/24/24: IMPRESSION: 1. Cervical spondylosis as outlined within the body of the report. 2. At C5-C6, there is moderate disc degeneration. A disc protrusion contributes to mild-to-moderate spinal canal stenosis to the right (with mild spinal cord flattening). Mild relative right neural foraminal narrowing also present at this level. 3. At C6-C7, there is moderate disc degeneration. A disc protrusion results in mild-to-moderate spinal canal stenosis (greater to  the right) with mild spinal cord flattening. 4. No significant spinal canal or foraminal stenosis at the remaining cervical levels. 5. Multilevel facet arthropathy, greatest on the right at C4-C5 (moderate) and on the right at C5-C6 (mild-to-moderate). 6. Nonspecific reversal of the expected cervical lordosis. 7. Levocurvature of the cervical spine. 8. Grade 1 anterolisthesis at C3-C4 and C4-C5.  PATIENT SURVEYS:  Quick Dash 22.73   POSTURE:  Thoracic dextro/lumbar levoscoliosis with Rt shoulder elevation and scapular winging  HAND DOMINANCE:  Right    Body Part #1 Shoulder   UPPER EXTREMITY MMT: EVAL: Able to demo strength hold against resistance with poor scapular control.                                                                                                              TREATMENT DATE: 6/3: see plan  Treatment                            5/28: Blank lines following charge title = not provided on this treatment date.   Manual:  TPDN YES Trigger Point Dry Needling  Subsequent Treatment: Instructions provided previously at initial dry needling treatment.   Patient Verbal Consent Given: Yes Education Handout Provided: Previously Provided Muscles Treated: Rt upper trap & levator Electrical Stimulation Performed: No Treatment Response/Outcome: twitch with decreased tension STM Rt rhomboids, infraspinatus, Tx paraspinals, subscap, lats Sidelying scap mobilizations Ktape Rt upper trap inhibition There-ex:  There-Act:  Self Care:  Nuro-Re-ed: Scap retraction with tactile cues Qped protraction  + UE reach Tall kneel Y on airex with glut/core set Gait Training:    Treatment  5/20: Blank lines following charge title = not provided on this treatment date.   Manual:  TPDN No Prone STM upper trap, levator, infraspinatus, supraspinatus, terres, rhomboids, mid trap Prone scap mobilization Prone gross rib mobilizations on rt  upper quadrant There-ex: Prone retraction, and with UE ext- PT assist in scap retraction/depression GHJ ER against wall for tactile cue of scap motion ER+abd press against wall   Y against wall  There-Act:  Self Care:  Nuro-Re-ed:  Gait Training:     PATIENT EDUCATION:  Education details: Anatomy of condition, POC, HEP, exercise form/rationale Person educated: Patient Education method: Explanation, Demonstration, Tactile cues, Verbal cues, and Handouts Education comprehension: verbalized understanding, returned demonstration, verbal cues required, tactile cues required, and needs further education  HOME EXERCISE PROGRAM: Access Code: [email or web address] URL: [email or web address] Date: 02/15/2024 Prepared by: Keven Pel  Program Notes lay down with core tight and shoulder blade back, inhale to fill upper right lung  Rt sidelying knee over knee PRI 90/90 hip lift with Left arm reach PRI   ASSESSMENT:  CLINICAL IMPRESSION: Time spent today discussing MRI report and MD message. Pt is understandably upset by the outcome and we discussed expected outcomes from epidurals and long term POC. She plans to schedule epidural per MD suggestion and I encouraged her to reach out to me with any needs. Manual therapy for cervical distraction and PA grade 2 mobs- 3rd round reported rt-sided neural symptoms that resolved with Lt to Rt mobilization gr 2. Felt comfortable in supine with small towel roll supporting natural cervical curvature- reported hands falling asleep but likely due to cold and stress tension- resolved with movement. Encouraged use of TENS for pain control.  REHAB POTENTIAL: Good  CLINICAL DECISION MAKING: Stable/uncomplicated  EVALUATION COMPLEXITY: Low   GOALS: Goals reviewed with patient? Yes  SHORT TERM GOALS: Target date: 5/24  Able to demo scapular control with UE MMT Baseline: Goal status: met  2.  Verbalize ability to correct posture  throughout the day, particularly at work with her right arm reaching forward to a mouse/keyboard Baseline:  Goal status: met  3.  Begin incorporating UE strength back into regular gym workout Baseline:  Goal status: partially met- low level for continued neuro re-ed    LONG TERM GOALS: Target date: POC date  Able to strengthen upper body during regular workouts with minimal to no discomfort in shoulder Baseline:  Goal status: INITIAL  2.  Quick [place] Retort to improve by MDC Baseline:  Goal status: INITIAL  3.  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  Able to maintain minimal to no pain throughout regular daily activities Baseline:  Goal status: INITIAL    PLAN:  PT FREQUENCY: 1-2x/week  PT DURATION: POC date  PLANNED INTERVENTIONS: 97164- PT Re-evaluation, 97750- Physical Performance Testing, 97110-Therapeutic exercises, 97530- Therapeutic activity, [id number]- Neuromuscular re-education, 97535- Self Care, 62130- Manual therapy, [phone]- Aquatic Therapy, Patient/Family education, Taping, Dry Needling, Joint mobilization, Spinal mobilization, Scar mobilization, Cryotherapy, and Moist heat.  PLAN FOR NEXT SESSION: STM/DN PRN, continue length and activation along the Rt to Lt post diagonal chain   Aleeyah Bensen C. Cameryn Chrisley PT, DPT 03/25/24 9:09 AM

## 2024-03-27 DIAGNOSIS — F419 Anxiety disorder, unspecified: Secondary | ICD-10-CM | POA: Diagnosis not present

## 2024-03-27 NOTE — Discharge Instructions (Signed)

## 2024-03-28 ENCOUNTER — Encounter: Payer: Self-pay | Admitting: Family Medicine

## 2024-03-28 ENCOUNTER — Other Ambulatory Visit (HOSPITAL_BASED_OUTPATIENT_CLINIC_OR_DEPARTMENT_OTHER): Payer: Self-pay

## 2024-03-28 MED ORDER — BUSPIRONE HCL 5 MG PO TABS
5.0000 mg | ORAL_TABLET | Freq: Three times a day (TID) | ORAL | 0 refills | Status: AC | PRN
  Filled 2024-03-28: qty 30, 10d supply, fill #0

## 2024-03-31 ENCOUNTER — Ambulatory Visit: Payer: Self-pay | Admitting: Family Medicine

## 2024-03-31 ENCOUNTER — Ambulatory Visit
Admission: RE | Admit: 2024-03-31 | Discharge: 2024-03-31 | Disposition: A | Discharge status: Home / Self Care | Source: Ambulatory Visit | Attending: Family Medicine | Admitting: Family Medicine

## 2024-03-31 DIAGNOSIS — M50122 Cervical disc disorder at C5-C6 level with radiculopathy: Secondary | ICD-10-CM | POA: Diagnosis not present

## 2024-03-31 DIAGNOSIS — M5412 Radiculopathy, cervical region: Secondary | ICD-10-CM

## 2024-03-31 DIAGNOSIS — M50123 Cervical disc disorder at C6-C7 level with radiculopathy: Secondary | ICD-10-CM | POA: Diagnosis not present

## 2024-03-31 MED ORDER — IOPAMIDOL (ISOVUE-M 300) INJECTION 61%
1.0000 mL | Freq: Once | INTRAMUSCULAR | Status: AC | PRN
  Administered 2024-03-31: 1 mL via EPIDURAL

## 2024-03-31 MED ORDER — TRIAMCINOLONE ACETONIDE 40 MG/ML IJ SUSP (RADIOLOGY)
60.0000 mg | Freq: Once | INTRAMUSCULAR | Status: AC
Start: 2024-03-31 — End: 2024-03-31
  Administered 2024-03-31: 60 mg via EPIDURAL

## 2024-03-31 NOTE — Therapy (Signed)
 OUTPATIENT PHYSICAL THERAPY TREATMENT   Patient Name: Madison [name] MRN: [id number] DOB:[birth year], 38 y.o., female Today's Date: 03/31/2024  END OF SESSION:        Past Medical History:  Diagnosis Date   Allergy    Asthma    Hx of migraines 01/012003   can tell when they come on, treats with excedrin migraine.   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   Chronic urticaria 05/11/2021   Other adverse food reactions, not elsewhere classified, subsequent encounter 05/11/2021   Seasonal and perennial allergic rhinitis 05/11/2021   Mild intermittent asthma without complication 02/02/2021   Trigger point of right shoulder region 04/07/2020   Nonallopathic lesion of lumbosacral region 11/07/2018   Nonallopathic lesion of sacral region 11/07/2018   Nonallopathic lesion of thoracic region 11/07/2018   Volar plate injury of finger 01/22/2018   Scoliosis 08/21/2017    REFERRING PROVIDER: Grafton Lawrence, MD  REFERRING DIAG: Rt shoulder posterior labrum tear  Rationale for Evaluation and Treatment: Rehabilitation  THERAPY DIAG:  No diagnosis found.  ONSET DATE: chronic back pain with subacute on chronic shoulder pain (March 2024)   SUBJECTIVE:                                                                                                                                                                                            SUBJECTIVE STATEMENT: ***  Cervical epidural on 03/31/24   EVAL: Have been seeing Dr Felipe Horton for about 6 years due to scoliosis. Neck pain without N/T. Rt-handed.   PERTINENT HISTORY:  Scoliosis with fusions that began at 38 yo   PAIN:  Are you having pain? Yes: NPRS scale: 3 at rest, 6 at worst Pain location: Rt shoulder into neck Pain description: ache Aggravating factors: dressing/undressing- rotation motion, laying on the shoulder, aches by end of day at computer Relieving factors: ice  PRECAUTIONS:  None  RED FLAGS: None   WEIGHT BEARING RESTRICTIONS:  No  FALLS:  Has patient fallen in last 6 months?  No  OCCUPATION:  [employer]  PLOF:  Independent  PATIENT GOALS:  Avoid surgery, decr pain   OBJECTIVE:  Note: Objective measures were completed at Evaluation unless otherwise noted.  DIAGNOSTIC FINDINGS:  MRI 01/23/24: IMPRESSION: 1. Findings suggestive of a nondisplaced tear at the base of the posteroinferior labrum. 2. No rotator cuff tear identified. MRI 03/24/24: IMPRESSION: 1. Cervical spondylosis as outlined within the body of the report. 2. At C5-C6, there is moderate disc degeneration. A disc protrusion contributes to mild-to-moderate spinal canal stenosis to the right (with mild spinal cord flattening). Mild relative right neural foraminal narrowing also present at this level. 3. At C6-C7, there is moderate disc degeneration. A disc protrusion results in mild-to-moderate spinal canal stenosis (greater to the right) with mild spinal cord flattening. 4. No significant spinal canal or foraminal stenosis at the remaining cervical levels. 5. Multilevel facet arthropathy, greatest on the right at C4-C5 (moderate) and on the right at C5-C6 (mild-to-moderate). 6. Nonspecific reversal of the expected cervical lordosis. 7. Levocurvature of the cervical spine. 8. Grade 1 anterolisthesis at C3-C4 and C4-C5.  PATIENT  SURVEYS:  Quick Dash 22.73   POSTURE:  Thoracic dextro/lumbar levoscoliosis with Rt shoulder elevation and scapular winging  HAND DOMINANCE:  Right    Body Part #1 Shoulder   UPPER EXTREMITY MMT: EVAL: Able to demo strength hold against resistance with poor scapular control.                                                                                                              TREATMENT DATE:  Treatment                            ***: Blank lines following charge title = not provided on this treatment date.   Manual:  {[email or web address]:[id number]}  There-ex:  There-Act:  Self Care:  Nuro-Re-ed:  Gait Training:    6/3: see plan  Treatment                            5/28: Blank lines following charge title = not provided on this treatment date.   Manual:  TPDN YES Trigger Point Dry Needling  Subsequent Treatment: Instructions provided previously at initial dry needling treatment.   Patient Verbal Consent Given: Yes Education Handout Provided: Previously Provided Muscles Treated: Rt upper trap & levator Electrical Stimulation Performed: No Treatment Response/Outcome: twitch with decreased tension STM Rt rhomboids, infraspinatus, Tx paraspinals, subscap, lats Sidelying scap mobilizations Ktape Rt upper trap inhibition There-ex:  There-Act:  Self Care:  Nuro-Re-ed: Scap retraction with tactile cues Qped protraction  + UE reach Tall kneel Y on airex with glut/core set Gait Training:    Treatment                            5/20: Blank lines following charge title = not provided on this treatment date.  Manual:  TPDN No Prone STM upper trap, levator, infraspinatus, supraspinatus, terres, rhomboids, mid trap Prone scap mobilization Prone gross rib mobilizations on rt upper quadrant There-ex: Prone retraction, and with UE ext- PT assist in scap retraction/depression GHJ ER against wall for tactile cue of scap motion ER+abd press against wall   Y  against wall  There-Act:  Self Care:  Nuro-Re-ed:  Gait Training:     PATIENT EDUCATION:  Education details: Anatomy of condition, POC, HEP, exercise form/rationale Person educated: Patient Education method: Explanation, Demonstration, Tactile cues, Verbal cues, and Handouts Education comprehension: verbalized understanding, returned demonstration, verbal cues required, tactile cues required, and needs further education  HOME EXERCISE PROGRAM: Access Code: [email or web address] URL: [email or web address] Date: 02/15/2024 Prepared by: Keven Pel  Program Notes lay down with core tight and shoulder blade back, inhale to fill upper right lung  Rt sidelying knee over knee PRI 90/90 hip lift with Left arm reach PRI   ASSESSMENT:  CLINICAL IMPRESSION: ***  REHAB POTENTIAL: Good  CLINICAL DECISION MAKING: Stable/uncomplicated  EVALUATION COMPLEXITY: Low   GOALS: Goals reviewed with patient? Yes  SHORT TERM GOALS: Target date: 5/24  Able to demo scapular control with UE MMT Baseline: Goal status: met  2.  Verbalize ability to correct posture throughout the day, particularly at work with her right arm reaching forward to a mouse/keyboard Baseline:  Goal status: met  3.  Begin incorporating UE strength back into regular gym workout Baseline:  Goal status: partially met- low level for continued neuro re-ed    LONG TERM GOALS: Target date: POC date  Able to strengthen upper body during regular workouts with minimal to no discomfort in shoulder Baseline:  Goal status: INITIAL  2.  Quick [place]e Retort to improve by MDC Baseline:  Goal status: INITIAL  3.  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  Able to maintain minimal to no pain throughout regular daily activities Baseline:  Goal status: INITIAL    PLAN:  PT FREQUENCY: 1-2x/week  PT DURATION: POC date  PLANNED INTERVENTIONS: 97164- PT  Re-evaluation, 97750- Physical Performance Testing, 97110-Therapeutic exercises, 97530- Therapeutic activity, [id number]- Neuromuscular re-education, 97535- Self Care, 95284- Manual therapy, [phone]- Aquatic Therapy, Patient/Family education, Taping, Dry Needling, Joint mobilization, Spinal mobilization, Scar mobilization, Cryotherapy, and Moist heat.  PLAN FOR NEXT SESSION: STM/DN PRN, continue length and activation along the Rt to Lt post diagonal chain   Ziad Maye C. Graylin Sperling PT, DPT 03/31/24 5:22 PM

## 2024-04-01 ENCOUNTER — Ambulatory Visit (HOSPITAL_BASED_OUTPATIENT_CLINIC_OR_DEPARTMENT_OTHER): Payer: Self-pay | Admitting: Physical Therapy

## 2024-04-01 ENCOUNTER — Encounter (HOSPITAL_BASED_OUTPATIENT_CLINIC_OR_DEPARTMENT_OTHER): Payer: Self-pay | Admitting: Physical Therapy

## 2024-04-01 DIAGNOSIS — M25511 Pain in right shoulder: Secondary | ICD-10-CM | POA: Diagnosis not present

## 2024-04-01 DIAGNOSIS — R293 Abnormal posture: Secondary | ICD-10-CM

## 2024-04-01 DIAGNOSIS — G8929 Other chronic pain: Secondary | ICD-10-CM | POA: Diagnosis not present

## 2024-04-02 ENCOUNTER — Encounter (HOSPITAL_BASED_OUTPATIENT_CLINIC_OR_DEPARTMENT_OTHER): Admitting: Physical Therapy

## 2024-04-04 ENCOUNTER — Other Ambulatory Visit (HOSPITAL_COMMUNITY): Payer: Self-pay

## 2024-04-04 ENCOUNTER — Other Ambulatory Visit: Payer: Self-pay

## 2024-04-04 ENCOUNTER — Other Ambulatory Visit: Payer: Self-pay | Admitting: Family Medicine

## 2024-04-04 DIAGNOSIS — F419 Anxiety disorder, unspecified: Secondary | ICD-10-CM | POA: Diagnosis not present

## 2024-04-04 MED ORDER — VITAMIN D (ERGOCALCIFEROL) 1.25 MG (50000 UNIT) PO CAPS
50000.0000 [IU] | ORAL_CAPSULE | ORAL | 0 refills | Status: DC
  Filled 2024-04-04: qty 12, 84d supply, fill #0

## 2024-04-08 ENCOUNTER — Other Ambulatory Visit (HOSPITAL_COMMUNITY): Payer: Self-pay

## 2024-04-09 ENCOUNTER — Encounter (HOSPITAL_BASED_OUTPATIENT_CLINIC_OR_DEPARTMENT_OTHER): Admitting: Physical Therapy

## 2024-04-15 ENCOUNTER — Ambulatory Visit (HOSPITAL_BASED_OUTPATIENT_CLINIC_OR_DEPARTMENT_OTHER): Admitting: Physical Therapy

## 2024-04-15 ENCOUNTER — Encounter (HOSPITAL_BASED_OUTPATIENT_CLINIC_OR_DEPARTMENT_OTHER): Payer: Self-pay | Admitting: Physical Therapy

## 2024-04-15 DIAGNOSIS — R293 Abnormal posture: Secondary | ICD-10-CM

## 2024-04-15 DIAGNOSIS — G8929 Other chronic pain: Secondary | ICD-10-CM | POA: Diagnosis not present

## 2024-04-15 DIAGNOSIS — F419 Anxiety disorder, unspecified: Secondary | ICD-10-CM | POA: Diagnosis not present

## 2024-04-15 DIAGNOSIS — M25511 Pain in right shoulder: Secondary | ICD-10-CM | POA: Diagnosis not present

## 2024-04-15 NOTE — Therapy (Signed)
 OUTPATIENT PHYSICAL THERAPY TREATMENT   Patient Name: Madison [name] MRN: [id number] DOB:[birth year], 38 y.o., female Today's Date: 04/15/2024  END OF SESSION:  PT End of Session - 04/15/24 0802     Visit Number 10    Number of Visits 25    Date for PT Re-Evaluation 05/09/24    Authorization Type [facility] [employer]    PT Start Time 0801    PT Stop Time 0845    PT Time Calculation (min) 44 min    Activity Tolerance Treatment limited secondary to medical complications (Comment)    Behavior During Therapy [facility] for tasks assessed/performed               Past Medical History:  Diagnosis Date   Allergy    Asthma    Hx of migraines 01/012003   can tell when they come on, treats with excedrin migraine.   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   Chronic urticaria 05/11/2021   Other adverse food reactions, not elsewhere classified, subsequent encounter 05/11/2021   Seasonal and perennial allergic rhinitis 05/11/2021   Mild intermittent asthma without complication 02/02/2021   Trigger point of right shoulder region 04/07/2020   Nonallopathic lesion of lumbosacral region 11/07/2018   Nonallopathic lesion of sacral region 11/07/2018   Nonallopathic lesion of thoracic region 11/07/2018   Volar plate injury of finger 01/22/2018   Scoliosis 08/21/2017    REFERRING PROVIDER: Bonner Hair, MD  REFERRING DIAG: Rt  shoulder posterior labrum tear  Rationale for Evaluation and Treatment: Rehabilitation  THERAPY DIAG:  Chronic right shoulder pain  Abnormal posture  ONSET DATE: chronic back pain with subacute on chronic shoulder pain (March 2024)   SUBJECTIVE:                                                                                                                                                                                           SUBJECTIVE STATEMENT: Dr Shoshana doesn't feel like we need to push for another epidural right away. Fingers dont  go numb but the fingers just dont feel like the other side. Bought a neck ice pack which helps.   Cervical epidural on 03/31/24 Rt C6-C7.    EVAL: Have been seeing Dr claudene for about 6 years due to scoliosis. Neck pain without N/T. Rt-handed.   PERTINENT HISTORY:  Scoliosis with fusions that began at 38 yo   PAIN:  Are you having pain? Yes: NPRS scale: 3 at rest, 6 at worst Pain location: Rt shoulder into neck Pain description: ache Aggravating factors: dressing/undressing- rotation motion, laying on the shoulder, aches by end of day at computer Relieving factors: ice  PRECAUTIONS:  None  RED FLAGS: None   WEIGHT BEARING RESTRICTIONS:  No  FALLS:  Has patient fallen in last 6 months? No  OCCUPATION:  [employer] for [employer]  PLOF:  Independent  PATIENT GOALS:  Avoid surgery, decr pain   OBJECTIVE:  Note: Objective measures were completed at Evaluation unless otherwise noted.  DIAGNOSTIC FINDINGS:  MRI 01/23/24: IMPRESSION: 1. Findings suggestive of a nondisplaced tear at the base of the posteroinferior labrum. 2. No rotator cuff tear identified. MRI 03/24/24: IMPRESSION: 1. Cervical spondylosis as outlined within the body of the report. 2. At C5-C6, there is moderate disc degeneration. A disc protrusion contributes to mild-to-moderate spinal canal stenosis to the right (with mild spinal cord flattening). Mild  relative right neural foraminal narrowing also present at this level. 3. At C6-C7, there is moderate disc degeneration. A disc protrusion results in mild-to-moderate spinal canal stenosis (greater to the right) with mild spinal cord flattening. 4. No significant spinal canal or foraminal stenosis at the remaining cervical levels. 5. Multilevel facet arthropathy, greatest on the right at C4-C5 (moderate) and on the right at C5-C6 (mild-to-moderate). 6. Nonspecific reversal of the expected cervical lordosis. 7. Levocurvature of the cervical spine. 8. Grade 1 anterolisthesis at C3-C4 and C4-C5.  PATIENT SURVEYS:  Quick Dash 22.73   POSTURE:  Thoracic dextro/lumbar levoscoliosis with Rt shoulder elevation and scapular winging  HAND DOMINANCE:  Right    Body Part #1 Shoulder   UPPER EXTREMITY MMT: EVAL: Able to demo strength hold against resistance with poor scapular control.                                                                                                              TREATMENT DATE:  Treatment                            6/24: Blank lines following charge title = not provided on this treatment date.   Manual:  TPDN No STM- Rt levator, upper trapC6-7 paraspinals, Lt cervical paraspinals, bilat suboccipitals, Rt SCM, Rt infraspinatus, Rt pec minor mob with movement Mobs: Rt <> Lt cervical mobs, cervical distraction, Rt first rib depression with AP GHJ mob with scap retraction There-ex:  There-Act:  Self Care: Anatomy of condition Neuropsych of pain Nuro-Re-ed:  Gait Training:   Treatment  6/10: Blank lines following charge title = not provided on this treatment date.   Manual:  TPDN No Self scalene massage Suboccpitial release Cervical traction  Rt STM: upper trap, levator, scalenes, SCM Rt first rib depression There-ex:  There-Act:  Self Care:  Nuro-Re-ed: Supine breathing with expansion of Lt upper/ant rib  cage Gait Training:      PATIENT EDUCATION:  Education details: Anatomy of condition, POC, HEP, exercise form/rationale Person educated: Patient Education method: Explanation, Demonstration, Tactile cues, Verbal cues, and Handouts Education comprehension: verbalized understanding, returned demonstration, verbal cues required, tactile cues required, and needs further education  HOME EXERCISE PROGRAM: Access Code: [email or web address] URL: [email or web address] Date: 02/15/2024 Prepared by: Harlene Cordon  Program Notes lay down with core tight and shoulder blade back, inhale to fill upper right lung  Rt sidelying knee over knee PRI 90/90 hip lift with Left arm reach PRI   ASSESSMENT:  CLINICAL IMPRESSION: Muscle spasm able to be reduced with STM today and found trigger point in infraspinatus that recreated concordant N/T into Rt UE. Discussed [email or web address].[email or web address] website as a tool for assistance.   REHAB POTENTIAL: Good  CLINICAL DECISION MAKING: Stable/uncomplicated  EVALUATION COMPLEXITY: Low   GOALS: Goals reviewed with patient? Yes  SHORT TERM GOALS: Target date: 5/24  Able to demo scapular control with UE MMT Baseline: Goal status: met  2.  Verbalize ability to correct posture throughout the day, particularly at work with her right arm reaching forward to a mouse/keyboard Baseline:  Goal status: met  3.  Begin incorporating UE strength back into regular gym workout Baseline:  Goal status: partially met- low level for continued neuro re-ed    LONG TERM GOALS: Target date: POC date  Able to strengthen upper body during regular workouts with minimal to no discomfort in shoulder Baseline:  Goal status: INITIAL  2.  Quick Hollis to improve by MDC Baseline:  Goal status: INITIAL  3.  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  Able to maintain minimal to no pain throughout regular daily  activities Baseline:  Goal status: INITIAL    PLAN:  PT FREQUENCY: 1-2x/week  PT DURATION: POC date  PLANNED INTERVENTIONS: 97164- PT Re-evaluation, 97750- Physical Performance Testing, 97110-Therapeutic exercises, 97530- Therapeutic activity, [id number]- Neuromuscular re-education, 97535- Self Care, [id number]- Manual therapy, [phone]- Aquatic Therapy, Patient/Family education, Taping, Dry Needling, Joint mobilization, Spinal mobilization, Scar mobilization, Cryotherapy, and Moist heat.  PLAN FOR NEXT SESSION: STM/DN PRN, continue length and activation along the Rt to Lt post diagonal chain   Farren Nelles C. Mattia Liford PT, DPT 04/15/24 9:36 AM

## 2024-04-22 ENCOUNTER — Encounter (HOSPITAL_BASED_OUTPATIENT_CLINIC_OR_DEPARTMENT_OTHER): Payer: Self-pay | Admitting: Physical Therapy

## 2024-04-22 ENCOUNTER — Ambulatory Visit (HOSPITAL_BASED_OUTPATIENT_CLINIC_OR_DEPARTMENT_OTHER): Attending: Orthopaedic Surgery | Admitting: Physical Therapy

## 2024-04-22 DIAGNOSIS — G8929 Other chronic pain: Secondary | ICD-10-CM | POA: Diagnosis not present

## 2024-04-22 DIAGNOSIS — M25511 Pain in right shoulder: Secondary | ICD-10-CM | POA: Diagnosis not present

## 2024-04-22 DIAGNOSIS — R293 Abnormal posture: Secondary | ICD-10-CM | POA: Diagnosis not present

## 2024-04-22 NOTE — Therapy (Signed)
 OUTPATIENT PHYSICAL THERAPY TREATMENT   Patient Name: Madison [name] MRN: [id number] DOB:02/19/1986, 38 y.o., female Today's Date: 04/22/2024  END OF SESSION:  PT End of Session - 04/22/24 1146     Visit Number 11    Number of Visits 25    Date for PT Re-Evaluation 05/09/24    Authorization Type [facility] [employer]    PT Start Time 1145    PT Stop Time 1230    PT Time Calculation (min) 45 min    Activity Tolerance Treatment limited secondary to medical complications (Comment)    Behavior During Therapy [facility] for tasks assessed/performed               Past Medical History:  Diagnosis Date   Allergy    Asthma    Hx of migraines 01/012003   can tell when they come on, treats with excedrin migraine.   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   Chronic urticaria 05/11/2021   Other adverse food reactions, not elsewhere classified, subsequent encounter 05/11/2021   Seasonal and perennial allergic rhinitis 05/11/2021   Mild intermittent asthma without complication 02/02/2021   Trigger point of right shoulder region 04/07/2020   Nonallopathic lesion of lumbosacral region 11/07/2018   Nonallopathic lesion of sacral region 11/07/2018   Nonallopathic lesion of thoracic region 11/07/2018   Volar plate injury of finger 01/22/2018   Scoliosis 08/21/2017    REFERRING PROVIDER: Bonner Hair, MD  REFERRING DIAG: Rt  shoulder posterior labrum tear  Rationale for Evaluation and Treatment: Rehabilitation  THERAPY DIAG:  Chronic right shoulder pain  ONSET DATE: chronic back pain with subacute on chronic shoulder pain (March 2024)   SUBJECTIVE:                                                                                                                                                                                           SUBJECTIVE STATEMENT: I feel like I have a bruise on Rt side of neck, pressure creates numbness in my hand.  Cervical epidural on 03/31/24 Rt C6-C7.    EVAL: Have been seeing Dr claudene for about 6 years due to scoliosis. Neck pain without N/T. Rt-handed.   PERTINENT HISTORY:  Scoliosis with fusions that began at 38 yo   PAIN:  Are you having pain? Yes: NPRS scale: 3 at rest, 6 at worst Pain location: Rt shoulder into neck Pain description: ache Aggravating factors: dressing/undressing- rotation motion, laying on the shoulder, aches by end of day at computer Relieving factors: ice  PRECAUTIONS:  None  RED FLAGS: None   WEIGHT BEARING RESTRICTIONS:  No  FALLS:  Has patient fallen in last 6 months? No  OCCUPATION:  [employer]  PLOF:  Independent  PATIENT GOALS:  Avoid surgery, decr pain   OBJECTIVE:  Note: Objective measures were completed at Evaluation unless otherwise noted.  DIAGNOSTIC FINDINGS:  MRI 01/23/24: IMPRESSION: 1. Findings suggestive of a nondisplaced tear at the base of the posteroinferior labrum. 2. No rotator cuff tear identified. MRI 03/24/24: IMPRESSION: 1. Cervical spondylosis as outlined within the body of the report. 2. At C5-C6, there is moderate disc degeneration. A disc protrusion contributes to mild-to-moderate spinal canal stenosis to the right (with mild spinal cord flattening). Mild relative right neural foraminal narrowing also present at this level. 3. At C6-C7, there is moderate disc degeneration. A disc  protrusion results in mild-to-moderate spinal canal stenosis (greater to the right) with mild spinal cord flattening. 4. No significant spinal canal or foraminal stenosis at the remaining cervical levels. 5. Multilevel facet arthropathy, greatest on the right at C4-C5 (moderate) and on the right at C5-C6 (mild-to-moderate). 6. Nonspecific reversal of the expected cervical lordosis. 7. Levocurvature of the cervical spine. 8. Grade 1 anterolisthesis at C3-C4 and C4-C5.  PATIENT SURVEYS:  Quick Dash 22.73   POSTURE:  Thoracic dextro/lumbar levoscoliosis with Rt shoulder elevation and scapular winging  HAND DOMINANCE:  Right    Body Part #1 Shoulder   UPPER EXTREMITY MMT: EVAL: Able to demo strength hold against resistance with poor scapular control.                                                                                                              TREATMENT DATE:  Treatment                            7/1: Blank lines following charge title = not provided on this treatment date.   Manual:  TPDN YES Trigger Point Dry Needling  Subsequent Treatment: Instructions provided previously at initial dry needling treatment.   Patient Verbal Consent Given: Yes Education Handout Provided: Previously Provided Muscles Treated: Rt levator scap, upper trap, C6/7 paraspinals Electrical Stimulation Performed: No Treatment Response/Outcome: twitch with decreased pain Supine pec minor bending IASTM Rt cervical region into upper trap & along medial border of scapula There-ex: Sheet upper trap stretch/first rib depression There-Act:  Self Care: Plane sleeping postures/neck pillows Nuro-Re-ed:  Gait Training:    Treatment  6/24: Blank lines following charge title = not provided on this treatment date.   Manual:  TPDN No STM- Rt levator, upper trapC6-7 paraspinals, Lt cervical paraspinals, bilat suboccipitals, Rt SCM, Rt infraspinatus, Rt pec  minor mob with movement Mobs: Rt <> Lt cervical mobs, cervical distraction, Rt first rib depression with AP GHJ mob with scap retraction There-ex:  There-Act:  Self Care: Anatomy of condition Neuropsych of pain Nuro-Re-ed:  Gait Training:   Treatment                            6/10: Blank lines following charge title = not provided on this treatment date.   Manual:  TPDN No Self scalene massage Suboccpitial release Cervical traction  Rt STM: upper trap, levator, scalenes, SCM Rt first rib depression There-ex:  There-Act:  Self Care:  Nuro-Re-ed: Supine breathing with expansion of Lt upper/ant rib cage Gait Training:      PATIENT EDUCATION:  Education details: Anatomy of condition, POC, HEP, exercise form/rationale Person educated: Patient Education method: Explanation, Demonstration, Tactile cues, Verbal cues, and Handouts Education comprehension: verbalized understanding, returned demonstration, verbal cues required, tactile cues required, and needs further education  HOME EXERCISE PROGRAM: Access Code: [email or web address] URL: [email or web address] Date: 02/15/2024 Prepared by: Harlene Cordon  Program Notes lay down with core tight and shoulder blade back, inhale to fill upper right lung  Rt sidelying knee over knee PRI 90/90 hip lift with Left arm reach PRI   ASSESSMENT:  CLINICAL IMPRESSION: Continued improvement with treatment and was able to apply pressure with sheet to the same area of complaint without N/T.   REHAB POTENTIAL: Good  CLINICAL DECISION MAKING: Stable/uncomplicated  EVALUATION COMPLEXITY: Low   GOALS: Goals reviewed with patient? Yes  SHORT TERM GOALS: Target date: 5/24  Able to demo scapular control with UE MMT Baseline: Goal status: met  2.  Verbalize ability to correct posture throughout the day, particularly at work with her right arm reaching forward to a mouse/keyboard Baseline:  Goal status: met  3.   Begin incorporating UE strength back into regular gym workout Baseline:  Goal status: partially met- low level for continued neuro re-ed    LONG TERM GOALS: Target date: POC date  Able to strengthen upper body during regular workouts with minimal to no discomfort in shoulder Baseline:  Goal status: INITIAL  2.  Quick Hollis to improve by MDC Baseline:  Goal status: INITIAL  3.  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  Able to maintain minimal to no pain throughout regular daily activities Baseline:  Goal status: INITIAL    PLAN:  PT FREQUENCY: 1-2x/week  PT DURATION: POC date  PLANNED INTERVENTIONS: 97164- PT Re-evaluation, 97750- Physical Performance Testing, 97110-Therapeutic exercises, 97530- Therapeutic activity, [id number]- Neuromuscular re-education, 97535- Self Care, 02859- Manual therapy, [phone]- Aquatic Therapy, Patient/Family education, Taping, Dry Needling, Joint mobilization, Spinal mobilization, Scar mobilization, Cryotherapy, and Moist heat.  PLAN FOR NEXT SESSION: STM/DN PRN, continue length and activation along the Rt to Lt post diagonal chain   Chesney Suares C. Lucina Betty PT, DPT 04/22/24 4:36 PM

## 2024-04-23 DIAGNOSIS — F411 Generalized anxiety disorder: Secondary | ICD-10-CM | POA: Diagnosis not present

## 2024-04-29 ENCOUNTER — Encounter (HOSPITAL_BASED_OUTPATIENT_CLINIC_OR_DEPARTMENT_OTHER): Payer: Self-pay | Admitting: Physical Therapy

## 2024-04-29 ENCOUNTER — Encounter (HOSPITAL_BASED_OUTPATIENT_CLINIC_OR_DEPARTMENT_OTHER): Admitting: Physical Therapy

## 2024-04-29 ENCOUNTER — Ambulatory Visit (HOSPITAL_BASED_OUTPATIENT_CLINIC_OR_DEPARTMENT_OTHER): Payer: Self-pay | Admitting: Physical Therapy

## 2024-04-29 DIAGNOSIS — R293 Abnormal posture: Secondary | ICD-10-CM | POA: Diagnosis not present

## 2024-04-29 DIAGNOSIS — M25511 Pain in right shoulder: Secondary | ICD-10-CM | POA: Diagnosis not present

## 2024-04-29 DIAGNOSIS — G8929 Other chronic pain: Secondary | ICD-10-CM

## 2024-04-29 NOTE — Therapy (Signed)
 OUTPATIENT PHYSICAL THERAPY TREATMENT   Patient Name: Madison [name] MRN: [id number] DOB:1986/01/21, 38 y.o., female Today's Date: 04/29/2024  END OF SESSION:  PT End of Session - 04/29/24 0834     Visit Number 12    Number of Visits 25    Date for PT Re-Evaluation 05/09/24    Authorization Type [facility] [employer]    PT Start Time 0800    PT Stop Time 0844    PT Time Calculation (min) 44 min    Activity Tolerance Treatment limited secondary to medical complications (Comment)    Behavior During Therapy [facility] for tasks assessed/performed                Past Medical History:  Diagnosis Date   Allergy    Asthma    Hx of migraines 01/012003   can tell when they come on, treats with excedrin migraine.   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   Chronic urticaria 05/11/2021   Other adverse food reactions, not elsewhere classified, subsequent encounter 05/11/2021   Seasonal and perennial allergic rhinitis 05/11/2021   Mild intermittent asthma without complication 02/02/2021   Trigger point of right shoulder region 04/07/2020   Nonallopathic lesion of lumbosacral region 11/07/2018   Nonallopathic lesion of sacral region 11/07/2018   Nonallopathic lesion of thoracic region 11/07/2018   Volar plate injury of finger 01/22/2018   Scoliosis 08/21/2017    REFERRING PROVIDER: Bonner Hair, MD  REFERRING DIAG:  Rt shoulder posterior labrum tear  Rationale for Evaluation and Treatment: Rehabilitation  THERAPY DIAG:  Chronic right shoulder pain  Abnormal posture  ONSET DATE: chronic back pain with subacute on chronic shoulder pain (March 2024)   SUBJECTIVE:                                                                                                                                                                                           SUBJECTIVE STATEMENT: Thursday had no pain, Friday felt wonky, went for a walk Sat AM and lost  full sensation in my right hand as I was walking. Progressively got more tight/spasm. Sunday was full on locked up and spent the day on my couch with the heading pad. Uncomfortable yesterday and still numb today. Numbness goes in and out, so does burning sensation.  20 deg sidebend to the right, 36 to the left. Unable to go any further to the right.  Ergonomics specialist came Thursday. The only thing adding is something under my feet.   Cervical epidural on 03/31/24 Rt C6-C7.    EVAL: Have been seeing Dr claudene for about 6 years due to scoliosis. Neck pain without N/T. Rt-handed.   PERTINENT HISTORY:  Scoliosis with fusions that began at 38 yo   PAIN:  Are you having pain? Yes: NPRS scale: 3 at rest, 6 at worst Pain location: Rt shoulder into neck Pain description: ache Aggravating factors: dressing/undressing- rotation motion, laying on the shoulder, aches by end of day at computer Relieving factors: ice  PRECAUTIONS:  None  RED FLAGS: None   WEIGHT BEARING RESTRICTIONS:  No  FALLS:  Has patient fallen in last 6 months? No  OCCUPATION:  [employer]  PLOF:  Independent  PATIENT GOALS:  Avoid surgery, decr pain   OBJECTIVE:  Note: Objective measures were completed at Evaluation unless otherwise noted.  DIAGNOSTIC FINDINGS:  MRI 01/23/24: IMPRESSION: 1. Findings suggestive of a nondisplaced tear at the base of  the posteroinferior labrum. 2. No rotator cuff tear identified. MRI 03/24/24: IMPRESSION: 1. Cervical spondylosis as outlined within the body of the report. 2. At C5-C6, there is moderate disc degeneration. A disc protrusion contributes to mild-to-moderate spinal canal stenosis to the right (with mild spinal cord flattening). Mild relative right neural foraminal narrowing also present at this level. 3. At C6-C7, there is moderate disc degeneration. A disc protrusion results in mild-to-moderate spinal canal stenosis (greater to the right) with mild spinal cord flattening. 4. No significant spinal canal or foraminal stenosis at the remaining cervical levels. 5. Multilevel facet arthropathy, greatest on the right at C4-C5 (moderate) and on the right at C5-C6 (mild-to-moderate). 6. Nonspecific reversal of the expected cervical lordosis. 7. Levocurvature of the cervical spine. 8. Grade 1 anterolisthesis at C3-C4 and C4-C5.  PATIENT SURVEYS:  Quick Dash 22.73   POSTURE:  Thoracic dextro/lumbar levoscoliosis with Rt shoulder elevation and scapular winging  HAND DOMINANCE:  Right    Body Part #1 Shoulder   UPPER EXTREMITY MMT: EVAL: Able to demo strength hold against resistance with poor scapular control.                                                                                                              TREATMENT DATE:  7/8 Rt first rib depression  C5, C6 Rt to Lt lateral mobs gr 2 & 3 Cervical traction Cervical traction with breathing into Rt ant/upper rib cage quadrant Self scalene cross friction & discussed use of tennis ball for STM.   Treatment  7/1: Blank lines following charge title = not provided on this treatment date.   Manual:  TPDN YES Trigger Point Dry Needling  Subsequent Treatment: Instructions provided previously at initial dry needling treatment.   Patient Verbal Consent Given: Yes Education Handout Provided:  Previously Provided Muscles Treated: Rt levator scap, upper trap, C6/7 paraspinals Electrical Stimulation Performed: No Treatment Response/Outcome: twitch with decreased pain Supine pec minor bending IASTM Rt cervical region into upper trap & along medial border of scapula There-ex: Sheet upper trap stretch/first rib depression There-Act:  Self Care: Plane sleeping postures/neck pillows Nuro-Re-ed:  Gait Training:    Treatment                            6/24: Blank lines following charge title = not provided on this treatment date.   Manual:  TPDN No STM- Rt levator, upper trapC6-7 paraspinals, Lt cervical paraspinals, bilat suboccipitals, Rt SCM, Rt infraspinatus, Rt pec minor mob with movement Mobs: Rt <> Lt cervical mobs, cervical distraction, Rt first rib depression with AP GHJ mob with scap retraction There-ex:  There-Act:  Self Care: Anatomy of condition Neuropsych of pain Nuro-Re-ed:  Gait Training:   Treatment                            6/10: Blank lines following charge title = not provided on this treatment date.   Manual:  TPDN No Self scalene massage Suboccpitial release Cervical traction  Rt STM: upper trap, levator, scalenes, SCM Rt first rib depression There-ex:  There-Act:  Self Care:  Nuro-Re-ed: Supine breathing with expansion of Lt upper/ant rib cage Gait Training:      PATIENT EDUCATION:  Education details: Anatomy of condition, POC, HEP, exercise form/rationale Person educated: Patient Education method: Explanation, Demonstration, Tactile cues, Verbal cues, and Handouts Education comprehension: verbalized understanding, returned demonstration, verbal cues required, tactile cues required, and needs further education  HOME EXERCISE PROGRAM: Access Code: [email or web address] URL: [email or web address] Date: 02/15/2024 Prepared by: Harlene Cordon  Program Notes lay down with core tight and shoulder blade back, inhale to  fill upper right lung  Rt sidelying knee over knee PRI 90/90 hip lift with Left arm reach PRI   ASSESSMENT:  CLINICAL IMPRESSION: Notable spasm in scalenes today. Concordant pain recreated with Rt cervical sidebend without change in left sidebend or distraction. Able to improve Rt to Lt cervical glides wihtout distal symptoms with manual therapy today. Encouraged her to reach out to MD who did the injection should numbness/burning in hand continue. Has appt with Dr Claudene next week.   REHAB POTENTIAL: Good  CLINICAL DECISION MAKING: Stable/uncomplicated  EVALUATION COMPLEXITY: Low   GOALS: Goals reviewed with patient? Yes  SHORT TERM GOALS: Target date: 5/24  Able to demo scapular control with UE MMT Baseline: Goal status: met  2.  Verbalize ability to correct posture throughout the day, particularly at work with her right arm reaching forward to a mouse/keyboard Baseline:  Goal status: met  3.  Begin incorporating UE strength back into regular gym workout Baseline:  Goal status: partially met- low level for continued neuro re-ed    LONG TERM GOALS: Target date: POC date  Able to strengthen upper body during regular workouts with minimal to no discomfort in shoulder Baseline:  Goal status: INITIAL  2.  Quick Hollis to improve by MDC Baseline:  Goal status: INITIAL  3.  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  Able to maintain minimal to no pain throughout regular daily activities Baseline:  Goal status: INITIAL    PLAN:  PT FREQUENCY: 1-2x/week  PT DURATION: POC date  PLANNED INTERVENTIONS: 97164- PT Re-evaluation, 97750- Physical Performance Testing, 97110-Therapeutic exercises, 97530- Therapeutic activity, [id number]- Neuromuscular re-education, 97535- Self Care, [id number]- Manual therapy, [phone]- Aquatic Therapy, Patient/Family education, Taping, Dry Needling, Joint mobilization, Spinal mobilization, Scar  mobilization, Cryotherapy, and Moist heat.  PLAN FOR NEXT SESSION: STM/DN PRN, continue length and activation along the Rt to Lt post diagonal chain   Dreden Rivere C. Jillann Charette PT, DPT 04/29/24 12:12 PM

## 2024-04-30 NOTE — Progress Notes (Unsigned)
  [name] [address] [place] [address] Phone: [phone] Subjective:   Madison [name] am a scribe for Dr. Claudene.   I'm seeing this patient by the request  of:  Madison Glade PARAS, MD  CC: back and neck pain follow up   YEP:Dlagzrupcz  Madison [name] is a 38 y.o. female coming in with complaint of back and neck pain. OMT on 03/21/2024. Epidural 6/9.  Been going to PT. Patient states that it hurts today. She is only taking the Effexor  XR.   Medications patient has been prescribed: Buspar  valium   Taking:no          Reviewed prior external information including notes and imaging from previsou exam, outside providers and external EMR if available.   As well as notes that were available from care everywhere and other healthcare systems.  Past medical history, social, surgical and family history all reviewed in electronic medical record.  No pertanent information unless stated regarding to the chief complaint.   Past Medical History:  Diagnosis Date   Allergy    Asthma    Hx of migraines 01/012003   can tell when they come on, treats with excedrin migraine.   Scoliosis 10/24/1995   Vitamin D  deficiency     Allergies  Allergen Reactions   Kiwi Extract Anaphylaxis   Prednisone      Tingling in hands and face   Meloxicam      heartburn     Review of Systems:  No headache, visual changes, nausea, vomiting, diarrhea, constipation, dizziness, abdominal pain, skin rash, fevers, chills, night sweats, weight loss, swollen lymph nodes, body aches, joint swelling, chest pain, shortness of breath, mood changes. POSITIVE muscle aches  Objective  There were no vitals taken for this visit.   General: No apparent distress alert and oriented x3 mood and affect normal, dressed appropriately.  HEENT: Pupils equal, extraocular movements intact  Respiratory: Patient's speak in full sentences and does not appear short of breath   Cardiovascular: No lower extremity edema, non tender, no erythema  Scoliosis noted. Severe upper back, neck still painful with extension of the neck and severe with right sided sidebending   Osteopathic findings  C2 flexed rotated and side bent right C6 flexed rotated and side bent left T3-7 N Rotated right sidebent left  T9 extended rotated and side bent left L3 flexed rotated and side bent right Sacrum right on right       Assessment and Plan:  No problem-specific Assessment & Plan notes found for this encounter.    Nonallopathic problems  Decision today to treat with OMT was based on Physical Exam  After verbal consent patient was treated with  ME, FPR techniques in cervical, rib, thoracic, lumbar, and sacral  areas, avoided HVLA   Patient tolerated the procedure well with improvement in symptoms  Patient given exercises, stretches and lifestyle modifications  See medications in patient instructions if given  Patient will follow up in 4-8 weeks     The above documentation has been reviewed and is accurate and complete Madison [name] M Madison Blucher, DO         Note: This dictation was prepared with Dragon dictation along with smaller phrase technology. Any transcriptional errors that result from this process are unintentional.

## 2024-05-05 ENCOUNTER — Encounter: Payer: Self-pay | Admitting: Family Medicine

## 2024-05-05 ENCOUNTER — Other Ambulatory Visit (HOSPITAL_BASED_OUTPATIENT_CLINIC_OR_DEPARTMENT_OTHER): Payer: Self-pay

## 2024-05-05 ENCOUNTER — Ambulatory Visit: Admitting: Family Medicine

## 2024-05-05 VITALS — BP 110/70 | HR 93 | Ht 63.0 in | Wt 138.4 lb

## 2024-05-05 DIAGNOSIS — M9903 Segmental and somatic dysfunction of lumbar region: Secondary | ICD-10-CM

## 2024-05-05 DIAGNOSIS — M9904 Segmental and somatic dysfunction of sacral region: Secondary | ICD-10-CM

## 2024-05-05 DIAGNOSIS — M542 Cervicalgia: Secondary | ICD-10-CM

## 2024-05-05 DIAGNOSIS — M9902 Segmental and somatic dysfunction of thoracic region: Secondary | ICD-10-CM | POA: Diagnosis not present

## 2024-05-05 DIAGNOSIS — M9901 Segmental and somatic dysfunction of cervical region: Secondary | ICD-10-CM

## 2024-05-05 DIAGNOSIS — M9908 Segmental and somatic dysfunction of rib cage: Secondary | ICD-10-CM | POA: Diagnosis not present

## 2024-05-05 DIAGNOSIS — M546 Pain in thoracic spine: Secondary | ICD-10-CM

## 2024-05-05 DIAGNOSIS — M5030 Other cervical disc degeneration, unspecified cervical region: Secondary | ICD-10-CM | POA: Diagnosis not present

## 2024-05-05 MED ORDER — PREDNISONE 20 MG PO TABS
40.0000 mg | ORAL_TABLET | Freq: Every day | ORAL | 0 refills | Status: DC
  Filled 2024-05-05: qty 10, 5d supply, fill #0

## 2024-05-05 MED ORDER — METHOCARBAMOL 500 MG PO TABS
500.0000 mg | ORAL_TABLET | Freq: Two times a day (BID) | ORAL | 0 refills | Status: AC | PRN
  Filled 2024-05-05: qty 60, 30d supply, fill #0

## 2024-05-05 NOTE — Patient Instructions (Addendum)
 [facility] [phone] Robaxin  Prescribed Prednisone  for trip See you again after your trip

## 2024-05-05 NOTE — Assessment & Plan Note (Signed)
 Arthritis noted. Discussed HEP Order another epidural, continue effexor  difficult with many meds so need to move slow but new robaxin  rx given  RTC in 6-8 weeks  Prednisone  given for trip as well. May come in before trip for toradol  and depomedrol injection

## 2024-05-06 ENCOUNTER — Encounter: Payer: Self-pay | Admitting: Family Medicine

## 2024-05-07 DIAGNOSIS — F411 Generalized anxiety disorder: Secondary | ICD-10-CM | POA: Diagnosis not present

## 2024-05-07 NOTE — Progress Notes (Signed)
 Referring Physician:  Geofm Glade PARAS, MD [address] [place],  [place] [address]  Primary Physician:  Geofm Glade PARAS, MD  History of Present Illness: 05/13/2024 [name] is here today with a chief complaint of scoliosis who presents with neck pain and arm numbness.  She experiences constant neck pain since early this year, radiating into her shoulder. The pain persists despite physical therapy and injections, with exacerbation after physical exertion. In late May, she developed arm pain and numbness. This is worse in her pinky finger, resolving quickly after shaking her arm. Injections have provided some symptom relief. She can cause her arm numbness to occur with extension of her arm.  Her work environment is ergonomically optimized.  She is currently doing PT. She has had a couple of injections.   Her neck pain in her right trapezius muscle is more problematic than her arm pain.  She has had some trigger point injections in addition to her epidural injections which have helped. Bowel/Bladder Dysfunction: none  Conservative measures:  Physical therapy: currently participating in at [facility] Mult[facility]odal medical therapy including regular antiinflammatories: Robaxin , Zanaflex , Prednisone  Injections:  05/09/24:  Right C6-7 ESI by Dr Arthea Sharps 03/31/2024- Right C6-7 ESI by Dr Arthea Sharps  Past Surgery: Thoracic Fusion in 1999  Madison [name] has no symptoms of cervical myelopathy.  The symptoms are causing a significant impact on the patient's life.   I have utilized the care everywhere function in epic to review the outside records available from external health systems.  Review of Systems:  A 10 point review of systems is negative, except for the pertinent positives and negatives detailed in the HPI.  Past Medical History: Past Medical History:  Diagnosis Date   Allergy    Asthma    Hx of migraines 01/012003   can tell when they come on, treats with  excedrin migraine.   Scoliosis 10/24/1995   Vitamin D  deficiency     Past Surgical History: Past Surgical History:  Procedure Laterality Date   partial spinal fusion C7-T2 due to scoliosis 03/1998 N/A 04/11/1998   TONSILLECTOMY     WISDOM TOOTH EXTRACTION      Allergies: Allergies as of 05/13/2024 - Review Complete 05/13/2024  Allergen Reaction Noted   Kiwi extract Anaphylaxis 02/20/2021   Prednisone   09/02/2015   Meloxicam   07/09/2019    Medications:  Current Outpatient Medications:    albuterol  (VENTOLIN  HFA) 108 (90 Base) MCG/ACT inhaler, Inhale 2 puffs into the lungs every 4 (four) hours as needed for wheezing or shortness of breath., Disp: 8.5 g, Rfl: 2   busPIRone  (BUSPAR ) 5 MG tablet, Take 1 tablet (5 mg total) by mouth 3 (three) times daily as needed., Disp: 30 tablet, Rfl: 0   Cyanocobalamin  (VITAMIN B-12 CR PO), daily., Disp: , Rfl:    fexofenadine (ALLEGRA) 30 MG tablet, Take 30 mg by mouth daily. , Disp: , Rfl:    methocarbamol  (ROBAXIN ) 500 MG tablet, Take 1 tablet (500 mg total) by mouth 2 (two) times daily as needed for muscle spasms., Disp: 60 tablet, Rfl: 0   predniSONE  (DELTASONE ) 20 MG tablet, Take 2 tablets (40 mg total) by mouth daily with breakfast., Disp: 10 tablet, Rfl: 0   Spacer/Aero-Holding Chambers (AEROCHAMBER PLUS WITH MASK) inhaler, Use as directed., Disp: 1 each, Rfl: 0   tacrolimus  (PROTOPIC ) 0.1 % ointment, Apply to affected areas twice daily for flares, Disp: 100 g, Rfl: 0   tiZANidine  (ZANAFLEX ) 4 MG tablet, Take  1 tablet (4 mg total) by mouth at bedtime., Disp: 30 tablet, Rfl: 0   venlafaxine  XR (EFFEXOR  XR) 37.5 MG 24 hr capsule, Take 1 capsule (37.5 mg total) by mouth daily with breakfast., Disp: 90 capsule, Rfl: 0   venlafaxine  XR (EFFEXOR  XR) 75 MG 24 hr capsule, Take 1 capsule (75 mg total) by mouth daily with breakfast., Disp: 90 capsule, Rfl: 0   Vitamin D , Ergocalciferol , (DRISDOL ) 1.25 MG (50000 UNIT) CAPS capsule, Take 1 capsule (50,000  Units total) by mouth every 7 (seven) days., Disp: 12 capsule, Rfl: 0  Social History: Social History   Tobacco Use   Smoking status: Never   Smokeless tobacco: Never  Vaping Use   Vaping status: Never Used  Substance Use Topics   Alcohol use: Yes    Comment: occasionally   Drug use: No    Family Medical History: Family History  Problem Relation Age of Onset   Healthy Mother    Other Father        prostate level elevation   Hearing loss Father    Prostate cancer Father    Diabetes Brother        type 2   Diabetes Mellitus II Brother        TWIN   Colon cancer Paternal Aunt    Breast cancer Maternal Grandmother 39   Osteoporosis Maternal Grandmother    Heart failure Maternal Grandmother    COPD Maternal Grandmother    Prostate cancer Maternal Grandfather        w mets   Heart disease Maternal Grandfather    Heart failure Maternal Grandfather    Brain cancer Paternal Grandmother    Melanoma Paternal Grandfather     Physical Examination: Vitals:   05/13/24 0841  BP: 112/72    General: Patient is in no apparent distress. Attention to examination is appropriate.  Neck:   Supple.  Full range of motion.  Respiratory: Patient is breathing without any difficulty.   NEUROLOGICAL:     Awake, alert, oriented to person, place, and time.  Speech is clear and fluent.   Cranial Nerves: Pupils equal round and reactive to light.  Facial tone is symmetric.  Facial sensation is symmetric. Shoulder shrug is symmetric. Tongue protrusion is midline.  There is no pronator drift.  Strength: Side Biceps Triceps Deltoid Interossei Grip Wrist Ext. Wrist Flex.  R 5 5 5 5 5 5 5   L 5 5 5 5 5 5 5    Side Iliopsoas Quads Hamstring PF DF EHL  R 5 5 5 5 5 5   L 5 5 5 5 5 5    Reflexes are 1+ and symmetric at the biceps, triceps, brachioradialis, patella and achilles.   Hoffman's is absent.   Bilateral upper and lower extremity sensation is intact to light touch.    No evidence of  dysmetria noted.  Gait is normal.     Medical Decision Making  Imaging: MRI C spine 03/23/2024 Disc levels:   Moderate disc degeneration at C5-C6 and C6-C7. No more than mild disc degeneration at the remaining levels.   C2-C3: No significant disc herniation or stenosis.   C3-C4: Grade 1 anterolisthesis. Mild facet arthropathy on the right. No significant spinal canal or foraminal stenosis.   C4-C5: Grade 1 anterolisthesis. Moderate facet arthropathy on the right. No significant spinal canal stenosis or neural foraminal narrowing.   C5-C6: Disc bulge with bilateral disc osteophyte ridge/uncinate hypertrophy. Superimposed broad-based right center disc protrusion. Mild-to-moderate facet arthropathy on the  right. The disc protrusion results in mild-to-moderate spinal canal stenosis to the right (with mild spinal cord flattening). Mild relative right neural foraminal narrowing.   C6-C7: Broad-based central disc protrusion eccentric to the right. The disc protrusion results in mild-to-moderate spinal canal stenosis (greater to the right) with mild spinal cord flattening. No significant foraminal stenosis.   C7-T1: No significant disc herniation or stenosis.   IMPRESSION: 1. Cervical spondylosis as outlined within the body of the report. 2. At C5-C6, there is moderate disc degeneration. A disc protrusion contributes to mild-to-moderate spinal canal stenosis to the right (with mild spinal cord flattening). Mild relative right neural foraminal narrowing also present at this level. 3. At C6-C7, there is moderate disc degeneration. A disc protrusion results in mild-to-moderate spinal canal stenosis (greater to the right) with mild spinal cord flattening. 4. No significant spinal canal or foraminal stenosis at the remaining cervical levels. 5. Multilevel facet arthropathy, greatest on the right at C4-C5 (moderate) and on the right at C5-C6 (mild-to-moderate). 6. Nonspecific  reversal of the expected cervical lordosis. 7. Levocurvature of the cervical spine. 8. Grade 1 anterolisthesis at C3-C4 and C4-C5.     Electronically Signed   By: Rockey Childs D.O.   On: 03/24/2024 12:35  C, T, L spine xrays reviewed -she has a significant left-sided thoracic curve with a right-sided lumbar curve.  Her overall spinal balance cannot be measured as these are separate radiographic series.  She does have a compensatory coronal plane abnormality in her cervical spine.  Her worst degenerative changes are at C5-6 and C6-7.  I have personally reviewed the images and agree with the above interpretation.  Assessment and Plan: Madison [name] is a pleasant 38 y.o. female with neck and right arm pain.  Her neck pain could be secondary to the scoliotic changes impacting her neck that are likely compensatory from her prior history of thoracic spinal surgery in 1999.  She continues to have a scoliotic curve in the thoracic spine in addition to a lumbar spine scoliosis that may have worsened over time.  She does not currently have significant lower back symptoms.  Based on her description of her symptoms, I am somewhat concerned that she may have thoracic outlet syndrome on the right side.  I would like to discuss this with her sports medicine physician Dr. Claudene.  She may ultimately require workup with a nerve conduction study of the right arm.  I do not see any explanation for her right arm symptoms on her cervical spine MRI scan.  For her neck pain, it is possible that her trapezius pain is secondary to the degenerative changes in her neck.  I am hopeful that her symptoms will be manageable with physical therapy, nonsteroidal medication, and occasional injections.  I spent a total of 30 minutes in this patient's care today. This time was spent reviewing pertinent records including imaging studies, obtaining and confirming history, performing a directed evaluation, formulating and discussing my  recommendations, and documenting the visit within the medical record.      Thank you for involving me in the care of this patient.      Wynston Romey K. Clois MD, [facility]surgery

## 2024-05-08 NOTE — Discharge Instructions (Signed)

## 2024-05-09 ENCOUNTER — Ambulatory Visit
Admission: RE | Admit: 2024-05-09 | Discharge: 2024-05-09 | Disposition: A | Discharge status: Home / Self Care | Source: Ambulatory Visit | Attending: Family Medicine | Admitting: Family Medicine

## 2024-05-09 DIAGNOSIS — M50123 Cervical disc disorder at C6-C7 level with radiculopathy: Secondary | ICD-10-CM | POA: Diagnosis not present

## 2024-05-09 DIAGNOSIS — M542 Cervicalgia: Secondary | ICD-10-CM

## 2024-05-09 DIAGNOSIS — M546 Pain in thoracic spine: Secondary | ICD-10-CM

## 2024-05-09 DIAGNOSIS — M50122 Cervical disc disorder at C5-C6 level with radiculopathy: Secondary | ICD-10-CM | POA: Diagnosis not present

## 2024-05-09 MED ORDER — IOPAMIDOL (ISOVUE-M 300) INJECTION 61%
1.0000 mL | Freq: Once | INTRAMUSCULAR | Status: AC
  Administered 2024-05-09: 1 mL via EPIDURAL

## 2024-05-09 MED ORDER — TRIAMCINOLONE ACETONIDE 40 MG/ML IJ SUSP (RADIOLOGY)
60.0000 mg | Freq: Once | INTRAMUSCULAR | Status: AC
  Administered 2024-05-09: 60 mg via EPIDURAL

## 2024-05-13 ENCOUNTER — Encounter: Payer: Self-pay | Admitting: Family Medicine

## 2024-05-13 ENCOUNTER — Encounter: Payer: Self-pay | Admitting: Neurosurgery

## 2024-05-13 ENCOUNTER — Ambulatory Visit: Payer: Self-pay | Admitting: Family Medicine

## 2024-05-13 ENCOUNTER — Ambulatory Visit: Admitting: Neurosurgery

## 2024-05-13 VITALS — BP 112/72 | Ht 63.0 in | Wt 135.0 lb

## 2024-05-13 DIAGNOSIS — R200 Anesthesia of skin: Secondary | ICD-10-CM

## 2024-05-13 DIAGNOSIS — M542 Cervicalgia: Secondary | ICD-10-CM

## 2024-05-14 ENCOUNTER — Ambulatory Visit (HOSPITAL_BASED_OUTPATIENT_CLINIC_OR_DEPARTMENT_OTHER): Admitting: Physical Therapy

## 2024-05-14 ENCOUNTER — Encounter (HOSPITAL_BASED_OUTPATIENT_CLINIC_OR_DEPARTMENT_OTHER): Payer: Self-pay | Admitting: Physical Therapy

## 2024-05-14 DIAGNOSIS — R293 Abnormal posture: Secondary | ICD-10-CM | POA: Diagnosis not present

## 2024-05-14 DIAGNOSIS — G8929 Other chronic pain: Secondary | ICD-10-CM | POA: Diagnosis not present

## 2024-05-14 DIAGNOSIS — M25511 Pain in right shoulder: Secondary | ICD-10-CM | POA: Diagnosis not present

## 2024-05-14 NOTE — Therapy (Signed)
 OUTPATIENT PHYSICAL THERAPY TREATMENT   Patient Name: Madison [name] MRN: [id number] DOB:May 07, 1986, 38 y.o., female Today's Date: 05/14/2024  END OF SESSION:  PT End of Session - 05/14/24 0804     Visit Number 13    Number of Visits 25    Date for PT Re-Evaluation 06/14/24    Authorization Type [facility] [employer]    PT Start Time 0805    PT Stop Time 0845    PT Time Calculation (min) 40 min    Activity Tolerance Treatment limited secondary to medical complications (Comment)    Behavior During Therapy [facility] for tasks assessed/performed                Past Medical History:  Diagnosis Date   Allergy    Asthma    Hx of migraines 01/012003   can tell when they come on, treats with excedrin migraine.   Scoliosis 10/24/1995   Vitamin D  deficiency    Past Surgical History:  Procedure Laterality Date   partial spinal fusion C7-T2 due to scoliosis 03/1998 N/A 04/11/1998   TONSILLECTOMY     WISDOM TOOTH EXTRACTION     Patient Active Problem List   Diagnosis Date Noted   DDD (degenerative disc disease), cervical 05/05/2024   Fatigue 10/05/2023   Right shoulder pain 04/24/2023   Hyperlipidemia 02/25/2023   Arthralgia of right acromioclavicular joint 11/28/2022   Puncture wound of skin from metal nail 07/27/2022   B12 deficiency 02/08/2022   Vitamin D  deficiency 02/08/2022   Carpal tunnel syndrome on right 11/07/2021   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   Chronic urticaria 05/11/2021   Other adverse food reactions, not elsewhere classified, subsequent encounter 05/11/2021   Seasonal and perennial allergic rhinitis 05/11/2021   Mild intermittent asthma without complication 02/02/2021   Trigger point of right shoulder region 04/07/2020   Nonallopathic lesion of lumbosacral region 11/07/2018   Nonallopathic lesion of sacral region 11/07/2018   Nonallopathic lesion of thoracic region 11/07/2018   Volar plate injury of finger 01/22/2018   Scoliosis 08/21/2017     REFERRING PROVIDER: Bonner Hair, MD  REFERRING DIAG: Rt shoulder posterior labrum tear  Rationale for Evaluation and Treatment: Rehabilitation  THERAPY DIAG:  Chronic right shoulder pain  Abnormal posture  ONSET DATE: chronic back pain with subacute on chronic shoulder pain (March 2024)   SUBJECTIVE:                                                                                                                                                                                           SUBJECTIVE STATEMENT: I am in zero pain right now.  I am using a stationary cycle. Injection got rid of deep bone- grating symptom.  Neuro surgeon thinks that thoracic outlet syndrome is primary problem.   Cervical epidural on 03/31/24 Rt C6-C7. Repeat injection on 7/18.    EVAL: Have been seeing Dr claudene for about 6 years due to scoliosis. Neck pain without N/T. Rt-handed.   PERTINENT HISTORY:  Scoliosis with fusions that began at 38 yo   PAIN:  Are you having pain? Yes: NPRS scale: 3 at rest, 6 at worst Pain location: Rt shoulder into neck Pain description: ache Aggravating factors: dressing/undressing- rotation motion, laying on the shoulder, aches by end of day at computer Relieving factors: ice  PRECAUTIONS:  None  RED FLAGS: None   WEIGHT BEARING RESTRICTIONS:  No  FALLS:  Has patient fallen in last 6 months? No  OCCUPATION:  [employer] for [employer]  PLOF:  Independent  PATIENT GOALS:  Avoid surgery, decr pain   OBJECTIVE:  Note: Objective measures were completed at Evaluation unless otherwise noted.  DIAGNOSTIC FINDINGS:  MRI 01/23/24: IMPRESSION: 1. Findings suggestive of a nondisplaced tear at the base of the posteroinferior labrum. 2. No rotator cuff tear identified. MRI 03/24/24: IMPRESSION: 1. Cervical spondylosis as outlined within the body of the report. 2. At C5-C6, there is moderate disc degeneration. A disc protrusion contributes to mild-to-moderate  spinal canal stenosis to the right (with mild spinal cord flattening). Mild relative right neural foraminal narrowing also present at this level. 3. At C6-C7, there is moderate disc degeneration. A disc protrusion results in mild-to-moderate spinal canal stenosis (greater to the right) with mild spinal cord flattening. 4. No significant spinal canal or foraminal stenosis at the remaining cervical levels. 5. Multilevel facet arthropathy, greatest on the right at C4-C5 (moderate) and on the right at C5-C6 (mild-to-moderate). 6. Nonspecific reversal of the expected cervical lordosis. 7. Levocurvature of the cervical spine. 8. Grade 1 anterolisthesis at C3-C4 and C4-C5.  PATIENT SURVEYS:  Quick Dash 22.73   POSTURE:  Thoracic dextro/lumbar levoscoliosis with Rt shoulder elevation and scapular winging  HAND DOMINANCE:  Right    Body Part #1 Shoulder   UPPER EXTREMITY MMT: EVAL: Able to demo strength hold against resistance with poor scapular control.                                                                                                              TREATMENT DATE:  7/23 Discussed traveling and neck pillow that she purchased Discussed rationale of discussions following meetings with providers and plan of care moving forward Nerve glides for radial, ulnar, median nerves in right upper extremity Supine 9090: Added toe taps, knee extension, leg lower, abs set with upper rib cage expansion and breathing  7/8 Rt first rib depression  C5, C6 Rt to Lt lateral mobs gr 2 & 3 Cervical traction Cervical traction with breathing into Rt ant/upper rib cage quadrant Self scalene cross friction & discussed use of tennis ball for STM.   Treatment  7/1: Blank lines following charge title = not provided on this treatment date.   Manual:  TPDN YES Trigger Point Dry Needling  Subsequent Treatment: Instructions provided previously at initial dry needling  treatment.   Patient Verbal Consent Given: Yes Education Handout Provided: Previously Provided Muscles Treated: Rt levator scap, upper trap, C6/7 paraspinals Electrical Stimulation Performed: No Treatment Response/Outcome: twitch with decreased pain Supine pec minor bending IASTM Rt cervical region into upper trap & along medial border of scapula There-ex: Sheet upper trap stretch/first rib depression There-Act:  Self Care: Plane sleeping postures/neck pillows Nuro-Re-ed:  Gait Training:    Treatment                            6/24: Blank lines following charge title = not provided on this treatment date.   Manual:  TPDN No STM- Rt levator, upper trapC6-7 paraspinals, Lt cervical paraspinals, bilat suboccipitals, Rt SCM, Rt infraspinatus, Rt pec minor mob with movement Mobs: Rt <> Lt cervical mobs, cervical distraction, Rt first rib depression with AP GHJ mob with scap retraction There-ex:  There-Act:  Self Care: Anatomy of condition Neuropsych of pain Nuro-Re-ed:  Gait Training:   Treatment                            6/10: Blank lines following charge title = not provided on this treatment date.   Manual:  TPDN No Self scalene massage Suboccpitial release Cervical traction  Rt STM: upper trap, levator, scalenes, SCM Rt first rib depression There-ex:  There-Act:  Self Care:  Nuro-Re-ed: Supine breathing with expansion of Lt upper/ant rib cage Gait Training:      PATIENT EDUCATION:  Education details: Anatomy of condition, POC, HEP, exercise form/rationale Person educated: Patient Education method: Explanation, Demonstration, Tactile cues, Verbal cues, and Handouts Education comprehension: verbalized understanding, returned demonstration, verbal cues required, tactile cues required, and needs further education  HOME EXERCISE PROGRAM: Access Code: [email or web address] URL: [email or web address] Date: 02/15/2024 Prepared by: Harlene Cordon  Program Notes lay down with core tight and shoulder blade back, inhale to fill upper right lung  Rt sidelying knee over knee PRI 90/90 hip lift with Left arm reach PRI   ASSESSMENT:  CLINICAL IMPRESSION: Patient has responded very well to injections and postural adjustments and is having no pain upon arrival today.  Exercises focused on nerve glides through the upper extremity and core strengthening for central stability.  We discussed gradual progression of cycling in order to judge input to areas of interest and progress as appropriate without causing pain.  Extending plan of care through the end of August to continue working on postural strength and stability and progressing appropriately until she leaves for her trip.  REHAB POTENTIAL: Good  CLINICAL DECISION MAKING: Stable/uncomplicated  EVALUATION COMPLEXITY: Low   GOALS: Goals reviewed with patient? Yes  SHORT TERM GOALS: Target date: 5/24  Able to demo scapular control with UE MMT Baseline: Goal status: met  2.  Verbalize ability to correct posture throughout the day, particularly at work with her right arm reaching forward to a mouse/keyboard Baseline:  Goal status: met  3.  Begin incorporating UE strength back into regular gym workout Baseline:  Goal status: partially met- low level for continued neuro re-ed    LONG TERM GOALS: Target date: POC date  Able to strengthen upper body during regular workouts with minimal to no discomfort in shoulder Baseline:  Goal status: INITIAL  2.  Quick Hollis to improve by MDC Baseline:  Goal status: INITIAL  3.  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  Able to maintain minimal to no pain throughout regular daily activities Baseline:  Goal status: INITIAL    PLAN:  PT FREQUENCY: 1-2x/week  PT DURATION: POC date  PLANNED INTERVENTIONS: 97164- PT Re-evaluation, 97750- Physical Performance  Testing, 97110-Therapeutic exercises, 97530- Therapeutic activity, [id number]- Neuromuscular re-education, 97535- Self Care, [id number]- Manual therapy, [phone]- Aquatic Therapy, Patient/Family education, Taping, Dry Needling, Joint mobilization, Spinal mobilization, Scar mobilization, Cryotherapy, and Moist heat.  PLAN FOR NEXT SESSION: STM/DN PRN, continue length and activation along the Rt to Lt post diagonal chain   Langley Flatley C. Rease Wence PT, DPT 05/14/24 9:49 AM

## 2024-05-15 ENCOUNTER — Ambulatory Visit: Admitting: Neurosurgery

## 2024-05-15 DIAGNOSIS — F411 Generalized anxiety disorder: Secondary | ICD-10-CM | POA: Diagnosis not present

## 2024-05-20 ENCOUNTER — Ambulatory Visit (HOSPITAL_BASED_OUTPATIENT_CLINIC_OR_DEPARTMENT_OTHER): Admitting: Physical Therapy

## 2024-05-20 ENCOUNTER — Encounter (HOSPITAL_BASED_OUTPATIENT_CLINIC_OR_DEPARTMENT_OTHER): Payer: Self-pay | Admitting: Physical Therapy

## 2024-05-20 DIAGNOSIS — G8929 Other chronic pain: Secondary | ICD-10-CM | POA: Diagnosis not present

## 2024-05-20 DIAGNOSIS — R293 Abnormal posture: Secondary | ICD-10-CM | POA: Diagnosis not present

## 2024-05-20 DIAGNOSIS — M25511 Pain in right shoulder: Secondary | ICD-10-CM | POA: Diagnosis not present

## 2024-05-20 NOTE — Therapy (Signed)
 OUTPATIENT PHYSICAL THERAPY TREATMENT   Patient Name: Madison [name] MRN: [id number] DOB:16-Jan-1986, 38 y.o., female Today's Date: 05/20/2024  END OF SESSION:  PT End of Session - 05/20/24 0740     Visit Number 14    Number of Visits 25    Date for PT Re-Evaluation 06/14/24    Authorization Type [facility] [employer]    PT Start Time 0740    PT Stop Time 0825    PT Time Calculation (min) 45 min    Activity Tolerance Treatment limited secondary to medical complications (Comment)    Behavior During Therapy [facility] for tasks assessed/performed                Past Medical History:  Diagnosis Date   Allergy    Asthma    Hx of migraines 01/012003   can tell when they come on, treats with excedrin migraine.   Scoliosis 10/24/1995   Vitamin D  deficiency    Past Surgical History:  Procedure Laterality Date   partial spinal fusion C7-T2 due to scoliosis 03/1998 N/A 04/11/1998   TONSILLECTOMY     WISDOM TOOTH EXTRACTION     Patient Active Problem List   Diagnosis Date Noted   DDD (degenerative disc disease), cervical 05/05/2024   Fatigue 10/05/2023   Right shoulder pain 04/24/2023   Hyperlipidemia 02/25/2023   Arthralgia of right acromioclavicular joint 11/28/2022   Puncture wound of skin from metal nail 07/27/2022   B12 deficiency 02/08/2022   Vitamin D  deficiency 02/08/2022   Carpal tunnel syndrome on right 11/07/2021   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   Chronic urticaria 05/11/2021   Other adverse food reactions, not elsewhere classified, subsequent encounter 05/11/2021   Seasonal and perennial allergic rhinitis 05/11/2021   Mild intermittent asthma without complication 02/02/2021   Trigger point of right shoulder region 04/07/2020   Nonallopathic lesion of lumbosacral region 11/07/2018   Nonallopathic lesion of sacral region 11/07/2018   Nonallopathic lesion of thoracic region 11/07/2018   Volar plate injury of finger 01/22/2018   Scoliosis 08/21/2017     REFERRING PROVIDER: Bonner Hair, MD  REFERRING DIAG: Rt shoulder posterior labrum tear  Rationale for Evaluation and Treatment: Rehabilitation  THERAPY DIAG:  Chronic right shoulder pain  Abnormal posture  ONSET DATE: chronic back pain with subacute on chronic shoulder pain (March 2024)   SUBJECTIVE:                                                                                                                                                                                           SUBJECTIVE STATEMENT: I am loving cycling. I did a  slow motion run and so it is sore. I am ok with not going back to running.   Neuro surgeon thinks that thoracic outlet syndrome is primary problem.   Cervical epidural on 03/31/24 Rt C6-C7. Repeat injection on 7/18.    EVAL: Have been seeing Dr claudene for about 6 years due to scoliosis. Neck pain without N/T. Rt-handed.   PERTINENT HISTORY:  Scoliosis with fusions that began at 38 yo   PAIN:  Are you having pain? Yes: NPRS scale: 3 at rest, 6 at worst Pain location: Rt shoulder into neck Pain description: ache Aggravating factors: dressing/undressing- rotation motion, laying on the shoulder, aches by end of day at computer Relieving factors: ice  PRECAUTIONS:  None  RED FLAGS: None   WEIGHT BEARING RESTRICTIONS:  No  FALLS:  Has patient fallen in last 6 months? No  OCCUPATION:  [employer]  PLOF:  Independent  PATIENT GOALS:  Avoid surgery, decr pain   OBJECTIVE:  Note: Objective measures were completed at Evaluation unless otherwise noted.  DIAGNOSTIC FINDINGS:  MRI 01/23/24: IMPRESSION: 1. Findings suggestive of a nondisplaced tear at the base of the posteroinferior labrum. 2. No rotator cuff tear identified. MRI 03/24/24: IMPRESSION: 1. Cervical spondylosis as outlined within the body of the report. 2. At C5-C6, there is moderate disc degeneration. A disc protrusion contributes to mild-to-moderate  spinal canal stenosis to the right (with mild spinal cord flattening). Mild relative right neural foraminal narrowing also present at this level. 3. At C6-C7, there is moderate disc degeneration. A disc protrusion results in mild-to-moderate spinal canal stenosis (greater to the right) with mild spinal cord flattening. 4. No significant spinal canal or foraminal stenosis at the remaining cervical levels. 5. Multilevel facet arthropathy, greatest on the right at C4-C5 (moderate) and on the right at C5-C6 (mild-to-moderate). 6. Nonspecific reversal of the expected cervical lordosis. 7. Levocurvature of the cervical spine. 8. Grade 1 anterolisthesis at C3-C4 and C4-C5.  PATIENT SURVEYS:  Quick Dash 22.73   POSTURE:  Thoracic dextro/lumbar levoscoliosis with Rt shoulder elevation and scapular winging  HAND DOMINANCE:  Right    Body Part #1 Shoulder   UPPER EXTREMITY MMT: EVAL: Able to demo strength hold against resistance with poor scapular control.                                                                                                              TREATMENT DATE:  7/29 STM upper trap, Rt suboccipitals, cervical distraction Cable row- feet on ground #10 Lat pull down- trial of long bar & short, wide grip #10 Cable biceps curl with ready stance #5 Cable triceps press with hip hinge #5 Discussed cycling & posture at home   7/23 Discussed traveling and neck pillow that she purchased Discussed rationale of discussions following meetings with providers and plan of care moving forward Nerve glides for radial, ulnar, median nerves in right upper extremity Supine 9090: Added toe taps, knee extension, leg lower, abs set with upper rib cage expansion and breathing  7/8  Rt first rib depression  C5, C6 Rt to Lt lateral mobs gr 2 & 3 Cervical traction Cervical traction with breathing into Rt ant/upper rib cage quadrant Self scalene cross friction & discussed use of tennis  ball for STM.   Treatment                            7/1: Blank lines following charge title = not provided on this treatment date.   Manual:  TPDN YES Trigger Point Dry Needling  Subsequent Treatment: Instructions provided previously at initial dry needling treatment.   Patient Verbal Consent Given: Yes Education Handout Provided: Previously Provided Muscles Treated: Rt levator scap, upper trap, C6/7 paraspinals Electrical Stimulation Performed: No Treatment Response/Outcome: twitch with decreased pain Supine pec minor bending IASTM Rt cervical region into upper trap & along medial border of scapula There-ex: Sheet upper trap stretch/first rib depression There-Act:  Self Care: Plane sleeping postures/neck pillows Nuro-Re-ed:  Gait Training:    Treatment                            6/24: Blank lines following charge title = not provided on this treatment date.   Manual:  TPDN No STM- Rt levator, upper trapC6-7 paraspinals, Lt cervical paraspinals, bilat suboccipitals, Rt SCM, Rt infraspinatus, Rt pec minor mob with movement Mobs: Rt <> Lt cervical mobs, cervical distraction, Rt first rib depression with AP GHJ mob with scap retraction There-ex:  There-Act:  Self Care: Anatomy of condition Neuropsych of pain Nuro-Re-ed:  Gait Training:   Treatment                            6/10: Blank lines following charge title = not provided on this treatment date.   Manual:  TPDN No Self scalene massage Suboccpitial release Cervical traction  Rt STM: upper trap, levator, scalenes, SCM Rt first rib depression There-ex:  There-Act:  Self Care:  Nuro-Re-ed: Supine breathing with expansion of Lt upper/ant rib cage Gait Training:      PATIENT EDUCATION:  Education details: Anatomy of condition, POC, HEP, exercise form/rationale Person educated: Patient Education method: Explanation, Demonstration, Tactile cues, Verbal cues, and Handouts Education  comprehension: verbalized understanding, returned demonstration, verbal cues required, tactile cues required, and needs further education  HOME EXERCISE PROGRAM: Access Code: [email or web address] URL: [email or web address] Date: 02/15/2024 Prepared by: Harlene Cordon  Program Notes lay down with core tight and shoulder blade back, inhale to fill upper right lung  Rt sidelying knee over knee PRI 90/90 hip lift with Left arm reach PRI   ASSESSMENT:  CLINICAL IMPRESSION: Todays focus was to begin progression back to upper body weight-resistance for exercise/strengthening. Some tightness in Rt shoulder/cervical region but did not require TPDN. Focus on form with breaks to check feeling- low weight for now.   REHAB POTENTIAL: Good  CLINICAL DECISION MAKING: Stable/uncomplicated  EVALUATION COMPLEXITY: Low   GOALS: Goals reviewed with patient? Yes  SHORT TERM GOALS: Target date: 5/24  Able to demo scapular control with UE MMT Baseline: Goal status: met  2.  Verbalize ability to correct posture throughout the day, particularly at work with her right arm reaching forward to a mouse/keyboard Baseline:  Goal status: met  3.  Begin incorporating UE strength back into regular gym workout Baseline:  Goal status: partially met- low level for continued neuro re-ed    LONG  TERM GOALS: Target date: POC date  Able to strengthen upper body during regular workouts with minimal to no discomfort in shoulder Baseline:  Goal status: INITIAL  2.  Quick Hollis to improve by MDC Baseline:  Goal status: INITIAL  3.  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  Able to maintain minimal to no pain throughout regular daily activities Baseline:  Goal status: INITIAL    PLAN:  PT FREQUENCY: 1-2x/week  PT DURATION: POC date  PLANNED INTERVENTIONS: 97164- PT Re-evaluation, 97750- Physical Performance Testing,  97110-Therapeutic exercises, 97530- Therapeutic activity, [id number]- Neuromuscular re-education, 97535- Self Care, 02859- Manual therapy, [phone]- Aquatic Therapy, Patient/Family education, Taping, Dry Needling, Joint mobilization, Spinal mobilization, Scar mobilization, Cryotherapy, and Moist heat.  PLAN FOR NEXT SESSION: STM/DN PRN, continue length and activation along the Rt to Lt post diagonal chain   Amel Kitch C. Aloysuis Ribaudo PT, DPT 05/20/24 8:39 AM

## 2024-05-21 DIAGNOSIS — F411 Generalized anxiety disorder: Secondary | ICD-10-CM | POA: Diagnosis not present

## 2024-05-29 ENCOUNTER — Ambulatory Visit (HOSPITAL_BASED_OUTPATIENT_CLINIC_OR_DEPARTMENT_OTHER): Attending: Orthopaedic Surgery | Admitting: Physical Therapy

## 2024-05-29 ENCOUNTER — Encounter (HOSPITAL_BASED_OUTPATIENT_CLINIC_OR_DEPARTMENT_OTHER): Payer: Self-pay | Admitting: Physical Therapy

## 2024-05-29 DIAGNOSIS — G8929 Other chronic pain: Secondary | ICD-10-CM | POA: Diagnosis not present

## 2024-05-29 DIAGNOSIS — F411 Generalized anxiety disorder: Secondary | ICD-10-CM | POA: Diagnosis not present

## 2024-05-29 DIAGNOSIS — M25511 Pain in right shoulder: Secondary | ICD-10-CM | POA: Diagnosis not present

## 2024-05-29 DIAGNOSIS — R293 Abnormal posture: Secondary | ICD-10-CM | POA: Diagnosis not present

## 2024-05-29 NOTE — Therapy (Signed)
 OUTPATIENT PHYSICAL THERAPY TREATMENT   Patient Name: Madison [name] MRN: [id number] DOB:1986/04/23, 38 y.o., female Today's Date: 05/29/2024  END OF SESSION:  PT End of Session - 05/29/24 1558     Visit Number 15    Number of Visits 25    Date for PT Re-Evaluation 06/14/24    Authorization Type [facility] [employer]    PT Start Time 1556    PT Stop Time 1643    PT Time Calculation (min) 47 min    Activity Tolerance Treatment limited secondary to medical complications (Comment)    Behavior During Therapy [facility] for tasks assessed/performed                 Past Medical History:  Diagnosis Date   Allergy    Asthma    Hx of migraines 01/012003   can tell when they come on, treats with excedrin migraine.   Scoliosis 10/24/1995   Vitamin D  deficiency    Past Surgical History:  Procedure Laterality Date   partial spinal fusion C7-T2 due to scoliosis 03/1998 N/A 04/11/1998   TONSILLECTOMY     WISDOM TOOTH EXTRACTION     Patient Active Problem List   Diagnosis Date Noted   DDD (degenerative disc disease), cervical 05/05/2024   Fatigue 10/05/2023   Right shoulder pain 04/24/2023   Hyperlipidemia 02/25/2023   Arthralgia of right acromioclavicular joint 11/28/2022   Puncture wound of skin from metal nail 07/27/2022   B12 deficiency 02/08/2022   Vitamin D  deficiency 02/08/2022   Carpal tunnel syndrome on right 11/07/2021   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   Chronic urticaria 05/11/2021   Other adverse food reactions, not elsewhere classified, subsequent encounter 05/11/2021   Seasonal and perennial allergic rhinitis 05/11/2021   Mild intermittent asthma without complication 02/02/2021   Trigger point of right shoulder region 04/07/2020   Nonallopathic lesion of lumbosacral region 11/07/2018   Nonallopathic lesion of sacral region 11/07/2018   Nonallopathic lesion of thoracic region 11/07/2018   Volar plate injury of finger 01/22/2018   Scoliosis 08/21/2017     REFERRING PROVIDER: Bonner Hair, MD  REFERRING DIAG: Rt shoulder posterior labrum tear  Rationale for Evaluation and Treatment: Rehabilitation  THERAPY DIAG:  Chronic right shoulder pain  Abnormal posture  ONSET DATE: chronic back pain with subacute on chronic shoulder pain (March 2024)   SUBJECTIVE:                                                                                                                                                                                           SUBJECTIVE STATEMENT: Rt side of neck is tight  but I can tell it is muscle, not nerve. Under armpit on my back feels tight- rubbing inferior/lateral portion of inferior angle.   Neuro surgeon thinks that thoracic outlet syndrome is primary problem.   Cervical epidural on 03/31/24 Rt C6-C7. Repeat injection on 7/18.    EVAL: Have been seeing Dr claudene for about 6 years due to scoliosis. Neck pain without N/T. Rt-handed.   PERTINENT HISTORY:  Scoliosis with fusions that began at 38 yo   PAIN:  Are you having pain? Yes: NPRS scale: 3 at rest, 6 at worst Pain location: Rt shoulder into neck Pain description: ache Aggravating factors: dressing/undressing- rotation motion, laying on the shoulder, aches by end of day at computer Relieving factors: ice  PRECAUTIONS:  None  RED FLAGS: None   WEIGHT BEARING RESTRICTIONS:  No  FALLS:  Has patient fallen in last 6 months? No  OCCUPATION:  [employer] for [employer]  PLOF:  Independent  PATIENT GOALS:  Avoid surgery, decr pain   OBJECTIVE:  Note: Objective measures were completed at Evaluation unless otherwise noted.  DIAGNOSTIC FINDINGS:  MRI 01/23/24: IMPRESSION: 1. Findings suggestive of a nondisplaced tear at the base of the posteroinferior labrum. 2. No rotator cuff tear identified. MRI 03/24/24: IMPRESSION: 1. Cervical spondylosis as outlined within the body of the report. 2. At C5-C6, there is moderate disc degeneration. A  disc protrusion contributes to mild-to-moderate spinal canal stenosis to the right (with mild spinal cord flattening). Mild relative right neural foraminal narrowing also present at this level. 3. At C6-C7, there is moderate disc degeneration. A disc protrusion results in mild-to-moderate spinal canal stenosis (greater to the right) with mild spinal cord flattening. 4. No significant spinal canal or foraminal stenosis at the remaining cervical levels. 5. Multilevel facet arthropathy, greatest on the right at C4-C5 (moderate) and on the right at C5-C6 (mild-to-moderate). 6. Nonspecific reversal of the expected cervical lordosis. 7. Levocurvature of the cervical spine. 8. Grade 1 anterolisthesis at C3-C4 and C4-C5.  PATIENT SURVEYS:  [name] 22.73 8/7: Quick Dash 18.18   POSTURE:  Thoracic dextro/lumbar levoscoliosis with Rt shoulder elevation and scapular winging  HAND DOMINANCE:  Right    Body Part #1 Shoulder   UPPER EXTREMITY MMT: EVAL: Able to demo strength hold against resistance with poor scapular control.                                                                                                              TREATMENT DATE:  8/7 MANUAL STM scalenes, levator, upper trap on Rt; Rt STM lats with stretching arm OH and blocking scapular motion; cervical traction; grade 2 Lt<>Rt cervical mobilizations Progression of supine pilates: lower core lift, table top with turnout, alt ext, alt drop out Discussed supine exercise for shoulder & awareness of Rt scap retraction/depression  7/29 STM upper trap, Rt suboccipitals, cervical distraction Cable row- feet on ground #10 Lat pull down- trial of long bar & short, wide grip #10 Cable biceps curl with ready stance #5 Cable triceps press with hip  hinge #5 Discussed cycling & posture at home   7/23 Discussed traveling and neck pillow that she purchased Discussed rationale of discussions following meetings with  providers and plan of care moving forward Nerve glides for radial, ulnar, median nerves in right upper extremity Supine 9090: Added toe taps, knee extension, leg lower, abs set with upper rib cage expansion and breathing  7/8 Rt first rib depression  C5, C6 Rt to Lt lateral mobs gr 2 & 3 Cervical traction Cervical traction with breathing into Rt ant/upper rib cage quadrant Self scalene cross friction & discussed use of tennis ball for STM.      PATIENT EDUCATION:  Education details: Teacher, music of condition, POC, HEP, exercise form/rationale Person educated: Patient Education method: Explanation, Demonstration, Tactile cues, Verbal cues, and Handouts Education comprehension: verbalized understanding, returned demonstration, verbal cues required, tactile cues required, and needs further education  HOME EXERCISE PROGRAM: Access Code: [email or web address] URL: [email or web address] Date: 02/15/2024 Prepared by: Harlene Cordon  Program Notes lay down with core tight and shoulder blade back, inhale to fill upper right lung  Rt sidelying knee over knee PRI 90/90 hip lift with Left arm reach PRI   ASSESSMENT:  CLINICAL IMPRESSION: Tightness in scalenes and lats reduced with manual therapy. Is doing well with core engagement and postural awareness in supine activities. Provided with some progressions of those exercises today but will choose to hold on increasing PT exercises until she returns from her trip as she is doing very well with cycling, pilates and gentle posture exercises right now.   REHAB POTENTIAL: Good  CLINICAL DECISION MAKING: Stable/uncomplicated  EVALUATION COMPLEXITY: Low   GOALS: Goals reviewed with patient? Yes  SHORT TERM GOALS: Target date: 5/24  Able to demo scapular control with UE MMT Baseline: Goal status: met  2.  Verbalize ability to correct posture throughout the day, particularly at work with her right arm reaching forward to a  mouse/keyboard Baseline:  Goal status: met  3.  Begin incorporating UE strength back into regular gym workout Baseline:  Goal status: partially met- low level for continued neuro re-ed    LONG TERM GOALS: Target date: POC date  Able to strengthen upper body during regular workouts with minimal to no discomfort in shoulder Baseline:  Goal status: INITIAL  2.  Quick Hollis to improve by MDC Baseline:  Goal status: INITIAL  3.  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  Able to maintain minimal to no pain throughout regular daily activities Baseline:  Goal status: INITIAL    PLAN:  PT FREQUENCY: 1-2x/week  PT DURATION: POC date  PLANNED INTERVENTIONS: 97164- PT Re-evaluation, 97750- Physical Performance Testing, 97110-Therapeutic exercises, 97530- Therapeutic activity, [id number]- Neuromuscular re-education, 97535- Self Care, 02859- Manual therapy, [phone]- Aquatic Therapy, Patient/Family education, Taping, Dry Needling, Joint mobilization, Spinal mobilization, Scar mobilization, Cryotherapy, and Moist heat.  PLAN FOR NEXT SESSION: STM/DN PRN, continue length and activation along the Rt to Lt post diagonal chain   Adelae Yodice C. Derren Suydam PT, DPT 05/29/24 4:50 PM

## 2024-06-02 ENCOUNTER — Other Ambulatory Visit: Payer: Self-pay | Admitting: Family Medicine

## 2024-06-02 ENCOUNTER — Other Ambulatory Visit (HOSPITAL_BASED_OUTPATIENT_CLINIC_OR_DEPARTMENT_OTHER): Payer: Self-pay

## 2024-06-02 MED ORDER — VENLAFAXINE HCL ER 75 MG PO CP24
75.0000 mg | ORAL_CAPSULE | Freq: Every day | ORAL | 0 refills | Status: DC
  Filled 2024-06-03: qty 90, 90d supply, fill #0

## 2024-06-03 ENCOUNTER — Other Ambulatory Visit (HOSPITAL_BASED_OUTPATIENT_CLINIC_OR_DEPARTMENT_OTHER): Payer: Self-pay

## 2024-06-03 ENCOUNTER — Ambulatory Visit (HOSPITAL_BASED_OUTPATIENT_CLINIC_OR_DEPARTMENT_OTHER): Admitting: Physical Therapy

## 2024-06-03 ENCOUNTER — Encounter (HOSPITAL_BASED_OUTPATIENT_CLINIC_OR_DEPARTMENT_OTHER): Payer: Self-pay | Admitting: Physical Therapy

## 2024-06-03 DIAGNOSIS — G8929 Other chronic pain: Secondary | ICD-10-CM

## 2024-06-03 DIAGNOSIS — M25511 Pain in right shoulder: Secondary | ICD-10-CM | POA: Diagnosis not present

## 2024-06-03 DIAGNOSIS — R293 Abnormal posture: Secondary | ICD-10-CM

## 2024-06-03 NOTE — Therapy (Signed)
 OUTPATIENT PHYSICAL THERAPY TREATMENT   Patient Name: Madison [name] MRN: [id number] DOB:06-26-1986, 38 y.o., female Today's Date: 06/03/2024  END OF SESSION:  PT End of Session - 06/03/24 0800     Visit Number 16    Number of Visits 25    Date for PT Re-Evaluation 06/14/24    Authorization Type [facility] [employer]    PT Start Time 0800    PT Stop Time 0840    PT Time Calculation (min) 40 min    Activity Tolerance Patient tolerated treatment well;No increased pain    Behavior During Therapy [facility] for tasks assessed/performed                 Past Medical History:  Diagnosis Date   Allergy    Asthma    Hx of migraines 01/012003   can tell when they come on, treats with excedrin migraine.   Scoliosis 10/24/1995   Vitamin D  deficiency    Past Surgical History:  Procedure Laterality Date   partial spinal fusion C7-T2 due to scoliosis 03/1998 N/A 04/11/1998   TONSILLECTOMY     WISDOM TOOTH EXTRACTION     Patient Active Problem List   Diagnosis Date Noted   DDD (degenerative disc disease), cervical 05/05/2024   Fatigue 10/05/2023   Right shoulder pain 04/24/2023   Hyperlipidemia 02/25/2023   Arthralgia of right acromioclavicular joint 11/28/2022   Puncture wound of skin from metal nail 07/27/2022   B12 deficiency 02/08/2022   Vitamin D  deficiency 02/08/2022   Carpal tunnel syndrome on right 11/07/2021   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   Chronic urticaria 05/11/2021   Other adverse food reactions, not elsewhere classified, subsequent encounter 05/11/2021   Seasonal and perennial allergic rhinitis 05/11/2021   Mild intermittent asthma without complication 02/02/2021   Trigger point of right shoulder region 04/07/2020   Nonallopathic lesion of lumbosacral region 11/07/2018   Nonallopathic lesion of sacral region 11/07/2018   Nonallopathic lesion of thoracic region 11/07/2018   Volar plate injury of finger 01/22/2018   Scoliosis 08/21/2017     REFERRING PROVIDER: Bonner Hair, MD  REFERRING DIAG: Rt shoulder posterior labrum tear  Rationale for Evaluation and Treatment: Rehabilitation  THERAPY DIAG:  Chronic right shoulder pain  Abnormal posture  ONSET DATE: chronic back pain with subacute on chronic shoulder pain (March 2024)   SUBJECTIVE:                                                                                                                                                                                           SUBJECTIVE STATEMENT: Some tightness in Rt upper trap and  along scapular border. Denies N/T/Burning. 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  Neuro surgeon thinks that thoracic outlet syndrome is primary problem.   Cervical epidural on 03/31/24 Rt C6-C7. Repeat injection on 7/18.    EVAL: Have been seeing Dr claudene for about 6 years due to scoliosis. Neck pain without N/T. Rt-handed.   PERTINENT HISTORY:  Scoliosis with fusions that began at 38 yo   PAIN:  Are you having pain? Yes: NPRS scale: 3 at rest, 6 at worst Pain location: Rt shoulder into neck Pain description: ache Aggravating factors: dressing/undressing- rotation motion, laying on the shoulder, aches by end of day at computer Relieving factors: ice  PRECAUTIONS:  None  RED FLAGS: None   WEIGHT BEARING RESTRICTIONS:  No  FALLS:  Has patient fallen in last 6 months? No  OCCUPATION:  [employer]  PLOF:  Independent  PATIENT GOALS:  Avoid surgery, decr pain   OBJECTIVE:  Note: Objective measures were completed at Evaluation unless otherwise noted.  DIAGNOSTIC FINDINGS:  MRI 01/23/24: IMPRESSION: 1. Findings suggestive of a nondisplaced tear at the base of the posteroinferior labrum. 2. No rotator cuff tear identified. MRI 03/24/24: IMPRESSION: 1. Cervical spondylosis as outlined within the body of the report. 2. At C5-C6, there is moderate disc degeneration. A disc protrusion contributes to  mild-to-moderate spinal canal stenosis to the right (with mild spinal cord flattening). Mild relative right neural foraminal narrowing also present at this level. 3. At C6-C7, there is moderate disc degeneration. A disc protrusion results in mild-to-moderate spinal canal stenosis (greater to the right) with mild spinal cord flattening. 4. No significant spinal canal or foraminal stenosis at the remaining cervical levels. 5. Multilevel facet arthropathy, greatest on the right at C4-C5 (moderate) and on the right at C5-C6 (mild-to-moderate). 6. Nonspecific reversal of the expected cervical lordosis. 7. Levocurvature of the cervical spine. 8. Grade 1 anterolisthesis at C3-C4 and C4-C5.  PATIENT SURVEYS:  [name] 22.73 8/7: Quick Dash 18.18   POSTURE:  Thoracic dextro/lumbar levoscoliosis with Rt shoulder elevation and scapular winging  HAND DOMINANCE:  Right    Body Part #1 Shoulder   UPPER EXTREMITY MMT: EVAL: Able to demo strength hold against resistance with poor scapular control.                                                                                                              TREATMENT DATE:  8/12 STM Rt upper trap, levator, periscap, passive scapular mobs in prone Prone retraction Sidelying retraction with resist by PT, abd, ER Discussed stretches/workout while on plane and throughout trip  8/7 MANUAL STM scalenes, levator, upper trap on Rt; Rt STM lats with stretching arm OH and blocking scapular motion; cervical traction; grade 2 Lt<>Rt cervical mobilizations Progression of supine pilates: lower core lift, table top with turnout, alt ext, alt drop out Discussed supine exercise for shoulder & awareness of Rt scap retraction/depression  7/29 STM upper trap, Rt suboccipitals, cervical distraction Cable row- feet on ground #10 Lat pull down-  trial of long bar & short, wide grip #10 Cable biceps curl with ready stance #5 Cable triceps press with hip  hinge #5 Discussed cycling & posture at home   7/23 Discussed traveling and neck pillow that she purchased Discussed rationale of discussions following meetings with providers and plan of care moving forward Nerve glides for radial, ulnar, median nerves in right upper extremity Supine 9090: Added toe taps, knee extension, leg lower, abs set with upper rib cage expansion and breathing  7/8 Rt first rib depression  C5, C6 Rt to Lt lateral mobs gr 2 & 3 Cervical traction Cervical traction with breathing into Rt ant/upper rib cage quadrant Self scalene cross friction & discussed use of tennis ball for STM.      PATIENT EDUCATION:  Education details: Teacher, music of condition, POC, HEP, exercise form/rationale Person educated: Patient Education method: Explanation, Demonstration, Tactile cues, Verbal cues, and Handouts Education comprehension: verbalized understanding, returned demonstration, verbal cues required, tactile cues required, and needs further education  HOME EXERCISE PROGRAM: Access Code: [email or web address] URL: [email or web address] Date: 02/15/2024 Prepared by: Harlene Cordon  Program Notes lay down with core tight and shoulder blade back, inhale to fill upper right lung  Rt sidelying knee over knee PRI 90/90 hip lift with Left arm reach PRI   ASSESSMENT:  CLINICAL IMPRESSION: Pt demo significant improvement in resting posture, no longer allowing head to tilt to the left and stacked with core engagement which she reports also noticing throughout there day- 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 Will be out of town for trip and will return after and will benefit from targeted strengthening and progressing stability.   REHAB POTENTIAL: Good  CLINICAL DECISION MAKING: Stable/uncomplicated  EVALUATION COMPLEXITY: Low   GOALS: Goals reviewed with patient? Yes  SHORT TERM GOALS: Target date: 5/24  Able to demo scapular control with UE  MMT Baseline: Goal status: met  2.  Verbalize ability to correct posture throughout the day, particularly at work with her right arm reaching forward to a mouse/keyboard Baseline:  Goal status: met  3.  Begin incorporating UE strength back into regular gym workout Baseline:  Goal status: partially met- low level for continued neuro re-ed    LONG TERM GOALS: Target date: POC date  Able to strengthen upper body during regular workouts with minimal to no discomfort in shoulder Baseline:  Goal status: INITIAL  2.  Quick Hollis to improve by MDC Baseline:  Goal status: INITIAL  3.  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  Able to maintain minimal to no pain throughout regular daily activities Baseline:  Goal status: INITIAL    PLAN:  PT FREQUENCY: 1-2x/week  PT DURATION: POC date  PLANNED INTERVENTIONS: 97164- PT Re-evaluation, 97750- Physical Performance Testing, 97110-Therapeutic exercises, 97530- Therapeutic activity, [id number]- Neuromuscular re-education, 97535- Self Care, 02859- Manual therapy, [phone]- Aquatic Therapy, Patient/Family education, Taping, Dry Needling, Joint mobilization, Spinal mobilization, Scar mobilization, Cryotherapy, and Moist heat.  PLAN FOR NEXT SESSION: STM/DN PRN, continue length and activation along the Rt to Lt post diagonal chain   Mata Rowen C. Danetra Glock PT, DPT 06/03/24 8:44 AM

## 2024-06-04 DIAGNOSIS — F411 Generalized anxiety disorder: Secondary | ICD-10-CM | POA: Diagnosis not present

## 2024-06-05 ENCOUNTER — Other Ambulatory Visit (HOSPITAL_BASED_OUTPATIENT_CLINIC_OR_DEPARTMENT_OTHER): Payer: Self-pay

## 2024-06-05 ENCOUNTER — Ambulatory Visit: Payer: 59 | Admitting: Dermatology

## 2024-06-05 ENCOUNTER — Encounter: Payer: Self-pay | Admitting: Dermatology

## 2024-06-05 VITALS — BP 96/66 | HR 87

## 2024-06-05 DIAGNOSIS — A630 Anogenital (venereal) warts: Secondary | ICD-10-CM | POA: Insufficient documentation

## 2024-06-05 DIAGNOSIS — D229 Melanocytic nevi, unspecified: Secondary | ICD-10-CM

## 2024-06-05 DIAGNOSIS — D1801 Hemangioma of skin and subcutaneous tissue: Secondary | ICD-10-CM | POA: Diagnosis not present

## 2024-06-05 DIAGNOSIS — Z1283 Encounter for screening for malignant neoplasm of skin: Secondary | ICD-10-CM

## 2024-06-05 DIAGNOSIS — L821 Other seborrheic keratosis: Secondary | ICD-10-CM

## 2024-06-05 DIAGNOSIS — W908XXA Exposure to other nonionizing radiation, initial encounter: Secondary | ICD-10-CM

## 2024-06-05 DIAGNOSIS — L578 Other skin changes due to chronic exposure to nonionizing radiation: Secondary | ICD-10-CM | POA: Diagnosis not present

## 2024-06-05 DIAGNOSIS — L814 Other melanin hyperpigmentation: Secondary | ICD-10-CM | POA: Diagnosis not present

## 2024-06-05 NOTE — Progress Notes (Signed)
   Total Body Skin Exam (TBSE) Visit   Subjective  Madison [name] is a 38 y.o. female who presents for the following: Skin Cancer Screening and Full Body Skin Exam  Patient presents today for follow up visit for TBSE. Patient was last evaluated on 06/06/23 . Patient reports medication changes. Patient reports she does have spots, moles and lesions of concern to be evaluated (Right Calf). Patient reports throughout her lifetime she has had moderate sun exposure. Currently, patient reports if she has excessive sun exposure, she does apply sunscreen and/or wears protective coverings. Patient reports she has hx of bx (Hx of Moderate DN, Left Thigh, Shave Removal completed, Margin Free 01/17/22 ). Patient reports  family history of skin cancers (Grand dad Hx of Melanoma, Dad B-Cell Lymphoma in the skin). The patient has spots, moles and lesions to be evaluated, some may be new or changing and the patient has concerns that these could be cancer. Patient reports she is not currently pregnant or trying to conceive.   The following portions of the chart were reviewed this encounter and updated as appropriate: medications, allergies, medical history  Review of Systems:  No other skin or systemic complaints except as noted in HPI or Assessment and Plan.  Objective  Well appearing patient in no apparent distress; mood and affect are within normal limits.  A full examination was performed including scalp, head, eyes, ears, nose, lips, neck, chest, axillae, abdomen, back, buttocks, bilateral upper extremities, bilateral lower extremities, hands, feet, fingers, toes, fingernails, and toenails. All findings within normal limits unless otherwise noted below.   Relevant physical exam findings are noted in the Assessment and Plan.    Assessment & Plan   LENTIGINES, SEBORRHEIC KERATOSES, HEMANGIOMAS - Benign normal skin lesions - Benign-appearing - Call for any changes  MELANOCYTIC NEVI - Tan-brown and/or  pink-flesh-colored symmetric macules and papules - Benign appearing on exam today - Observation - Call clinic for new or changing moles - Recommend daily use of broad spectrum spf 30+ sunscreen to sun-exposed areas.   MILD ACTINIC DAMAGE - Chronic condition, secondary to cumulative UV/sun exposure - diffuse scaly erythematous macules with underlying dyspigmentation - Recommend daily broad spectrum sunscreen SPF 30+ to sun-exposed areas, reapply every 2 hours as needed.  - Staying in the shade or wearing long sleeves, sun glasses (UVA+UVB protection) and wide brim hats (4-inch brim around the entire circumference of the hat) are also recommended for sun protection.  - Call for new or changing lesions.  SKIN CANCER SCREENING PERFORMED TODAY.    Return in about 1 year (around 06/05/2025) for TBSE.  I, Jetta Ager, am acting as Neurosurgeon for [employer], DO.  Documentation: I have reviewed the above documentation for accuracy and completeness, and I agree with the above.  Delon Lenis, DO

## 2024-06-05 NOTE — Patient Instructions (Signed)

## 2024-06-20 DIAGNOSIS — F411 Generalized anxiety disorder: Secondary | ICD-10-CM | POA: Diagnosis not present

## 2024-06-24 ENCOUNTER — Ambulatory Visit (HOSPITAL_BASED_OUTPATIENT_CLINIC_OR_DEPARTMENT_OTHER): Attending: Orthopaedic Surgery | Admitting: Physical Therapy

## 2024-06-24 ENCOUNTER — Encounter (HOSPITAL_BASED_OUTPATIENT_CLINIC_OR_DEPARTMENT_OTHER): Payer: Self-pay | Admitting: Physical Therapy

## 2024-06-24 DIAGNOSIS — G8929 Other chronic pain: Secondary | ICD-10-CM | POA: Insufficient documentation

## 2024-06-24 DIAGNOSIS — M25511 Pain in right shoulder: Secondary | ICD-10-CM | POA: Insufficient documentation

## 2024-06-24 DIAGNOSIS — R293 Abnormal posture: Secondary | ICD-10-CM | POA: Diagnosis not present

## 2024-06-24 NOTE — Therapy (Signed)
 OUTPATIENT PHYSICAL THERAPY TREATMENT   Patient Name: Madison [name] MRN: [id number] DOB:1986/06/17, 38 y.o., female Today's Date: 06/24/2024  END OF SESSION:  PT End of Session - 06/24/24 1147     Visit Number 17    Number of Visits 25    Date for PT Re-Evaluation 08/23/24    Authorization Type [facility] [employer]    PT Start Time 1146    PT Stop Time 1228    PT Time Calculation (min) 42 min    Activity Tolerance Patient tolerated treatment well    Behavior During Therapy [facility] for tasks assessed/performed                 Past Medical History:  Diagnosis Date   Allergy    Asthma    Hx of migraines 01/012003   can tell when they come on, treats with excedrin migraine.   Scoliosis 10/24/1995   Vitamin D  deficiency    Past Surgical History:  Procedure Laterality Date   partial spinal fusion C7-T2 due to scoliosis 03/1998 N/A 04/11/1998   TONSILLECTOMY     WISDOM TOOTH EXTRACTION     Patient Active Problem List   Diagnosis Date Noted   Anogenital warts 06/05/2024   DDD (degenerative disc disease), cervical 05/05/2024   Fatigue 10/05/2023   Right shoulder pain 04/24/2023   Hyperlipidemia 02/25/2023   Arthralgia of right acromioclavicular joint 11/28/2022   Puncture wound of skin from metal nail 07/27/2022   B12 deficiency 02/08/2022   Vitamin D  deficiency 02/08/2022   Allergy, unspecified, initial encounter 01/25/2022   Laceration without foreign body of other finger without damage to nail, initial encounter 01/25/2022   Carpal tunnel syndrome on right 11/07/2021   Dorsalgia, unspecified 08/21/2021   Migraine, unspecified, not intractable, without status migrainosus 08/21/2021   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   Chronic urticaria 05/11/2021   Other adverse food reactions, not elsewhere classified, subsequent encounter 05/11/2021   Seasonal and perennial allergic rhinitis 05/11/2021   Mild intermittent asthma without complication 02/02/2021    Trigger point of right shoulder region 04/07/2020   Nonallopathic lesion of lumbosacral region 11/07/2018   Nonallopathic lesion of sacral region 11/07/2018   Nonallopathic lesion of thoracic region 11/07/2018   Volar plate injury of finger 01/22/2018   Scoliosis 08/21/2017    REFERRING PROVIDER: Bonner Hair, MD  REFERRING DIAG: Rt shoulder posterior labrum tear  Rationale for Evaluation and Treatment: Rehabilitation  THERAPY DIAG:  Chronic right shoulder pain  Abnormal posture  ONSET DATE: chronic back pain with subacute on chronic shoulder pain (March 2024)   SUBJECTIVE:  SUBJECTIVE STATEMENT:  Saw you on Tuesday- Thursday did the UE weight work and was fine, was standing up from hip machine and got a jolt through my spine and could not turn my head.   Neuro surgeon thinks that thoracic outlet syndrome is primary problem.   Cervical epidural on 03/31/24 Rt C6-C7. Repeat injection on 7/18.    EVAL: Have been seeing Dr claudene for about 6 years due to scoliosis. Neck pain without N/T. Rt-handed.   PERTINENT HISTORY:  Scoliosis with fusions that began at 38 yo   PAIN:  Are you having pain? Yes: NPRS scale: 3 at rest, 6 at worst Pain location: Rt shoulder into neck Pain description: ache Aggravating factors: dressing/undressing- rotation motion, laying on the shoulder, aches by end of day at computer Relieving factors: ice  PRECAUTIONS:  None  RED FLAGS: None   WEIGHT BEARING RESTRICTIONS:  No  FALLS:  Has patient fallen in last 6 months? No  OCCUPATION:  [employer] for [employer]  PLOF:  Independent  PATIENT GOALS:  Avoid surgery, decr pain   OBJECTIVE:  Note: Objective measures were completed at Evaluation unless otherwise noted.  DIAGNOSTIC FINDINGS:  MRI  01/23/24: IMPRESSION: 1. Findings suggestive of a nondisplaced tear at the base of the posteroinferior labrum. 2. No rotator cuff tear identified. MRI 03/24/24: IMPRESSION: 1. Cervical spondylosis as outlined within the body of the report. 2. At C5-C6, there is moderate disc degeneration. A disc protrusion contributes to mild-to-moderate spinal canal stenosis to the right (with mild spinal cord flattening). Mild relative right neural foraminal narrowing also present at this level. 3. At C6-C7, there is moderate disc degeneration. A disc protrusion results in mild-to-moderate spinal canal stenosis (greater to the right) with mild spinal cord flattening. 4. No significant spinal canal or foraminal stenosis at the remaining cervical levels. 5. Multilevel facet arthropathy, greatest on the right at C4-C5 (moderate) and on the right at C5-C6 (mild-to-moderate). 6. Nonspecific reversal of the expected cervical lordosis. 7. Levocurvature of the cervical spine. 8. Grade 1 anterolisthesis at C3-C4 and C4-C5.  PATIENT SURVEYS:  Madison [name] 22.73 8/7: Quick Dash 18.18   POSTURE:  Thoracic dextro/lumbar levoscoliosis with Rt shoulder elevation and scapular winging  HAND DOMINANCE:  Right    Body Part #1 Shoulder   UPPER EXTREMITY MMT: EVAL: Able to demo strength hold against resistance with poor scapular control.   MMT lb Right 9/2  Left 9/2  Shoulder flexion    Shoulder extension    Shoulder abduction    Shoulder adduction    Shoulder internal rotation    Shoulder external rotation    Elbow flexion    Elbow extension    (Blank rows = not tested)   9/2 cervical sidebend Rt 28 p! / Lt 34, rotation Rt 42 p!/ Lt 48                                                                                                             TREATMENT DATE:  9/2 STM Rt scalenes, upper trap Cervical  distraction with Rt first rib depression + sidebend, + rotation Lower cervical Rt to Lt mobs   Post manual: cervical sidebend Rt 36 / Lt 42, rotation Rt 50 / Lt 60 First rib mob with sheet Discussed biking  8/12 STM Rt upper trap, levator, periscap, passive scapular mobs in prone Prone retraction Sidelying retraction with resist by PT, abd, ER Discussed stretches/workout while on plane and throughout trip  8/7 MANUAL STM scalenes, levator, upper trap on Rt; Rt STM lats with stretching arm OH and blocking scapular motion; cervical traction; grade 2 Lt<>Rt cervical mobilizations Progression of supine pilates: lower core lift, table top with turnout, alt ext, alt drop out Discussed supine exercise for shoulder & awareness of Rt scap retraction/depression  7/29 STM upper trap, Rt suboccipitals, cervical distraction Cable row- feet on ground #10 Lat pull down- trial of long bar & short, wide grip #10 Cable biceps curl with ready stance #5 Cable triceps press with hip hinge #5 Discussed cycling & posture at home      PATIENT EDUCATION:  Education details: Teacher, music of condition, POC, HEP, exercise form/rationale Person educated: Patient Education method: Explanation, Demonstration, Tactile cues, Verbal cues, and Handouts Education comprehension: verbalized understanding, returned demonstration, verbal cues required, tactile cues required, and needs further education  HOME EXERCISE PROGRAM: Access Code: [email or web address] URL: [email or web address] Date: 02/15/2024 Prepared by: Harlene Cordon  Program Notes lay down with core tight and shoulder blade back, inhale to fill upper right lung  Rt sidelying knee over knee PRI 90/90 hip lift with Left arm reach PRI   ASSESSMENT:  CLINICAL IMPRESSION: Concordant pain reduced with mobilization of Rt first rib to decrease elevation. Plans to begin bike riding outdoors and discussed importance of shorter distances as well as lifting bike into/out of car. Will continue to with weight work at the gym but plans to move  biceps/triceps from cable to free weights.   REHAB POTENTIAL: Good  CLINICAL DECISION MAKING: Stable/uncomplicated  EVALUATION COMPLEXITY: Low   GOALS: Goals reviewed with patient? Yes  SHORT TERM GOALS: Target date: 5/24  Able to demo scapular control with UE MMT Baseline: Goal status: met  2.  Verbalize ability to correct posture throughout the day, particularly at work with her right arm reaching forward to a mouse/keyboard Baseline:  Goal status: met  3.  Begin incorporating UE strength back into regular gym workout Baseline:  Goal status: partially met- low level for continued neuro re-ed    LONG TERM GOALS: Target date: POC date  Able to strengthen upper body during regular workouts with minimal to no discomfort in shoulder Baseline:  Goal status: INITIAL  2.  Quick Hollis to improve by MDC Baseline:  Goal status: INITIAL  3.  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  Able to maintain minimal to no pain throughout regular daily activities Baseline:  Goal status: INITIAL    PLAN:  PT FREQUENCY: 1-2x/week  PT DURATION: POC date  PLANNED INTERVENTIONS: 97164- PT Re-evaluation, 97750- Physical Performance Testing, 97110-Therapeutic exercises, 97530- Therapeutic activity, [id number]- Neuromuscular re-education, 97535- Self Care, [id number]- Manual therapy, [phone]- Aquatic Therapy, Patient/Family education, Taping, Dry Needling, Joint mobilization, Spinal mobilization, Scar mobilization, Cryotherapy, and Moist heat.  PLAN FOR NEXT SESSION: STM/DN PRN, continue length and activation along the Rt to Lt post diagonal chain   Stanislawa Gaffin C. Sanyia Dini PT, DPT 06/24/24 12:33  PM

## 2024-06-25 NOTE — Progress Notes (Unsigned)
 [name] [address] [place] [address] Phone: [phone] Subjective:   Madison [name] Madison [name], am serving as a scribe for Dr. Arthea [name].  I'm seeing this patient by the request  of:  Madison Glade PARAS, MD  CC: Back and neck pain follow-up  YEP:Dlagzrupcz  Madison [name] is a 38 y.o. female coming in with complaint of back and neck pain. OMT 05/05/2024. Also had epidural on 05/09/2024. Patient states that epidural got her 100%. Has tightness in the R side of neck after an incident a few weeks ago. Is going to PT and they manipulated 1st rib which was helpful.   Retired running shoes and is now doing more cycling.   Medications patient has been prescribed: Robaxin , Prednisone , Effexor   Taking:         Reviewed prior external information including notes and imaging from previsou exam, outside providers and external EMR if available.   As well as notes that were available from care everywhere and other healthcare systems.  Past medical history, social, surgical and family history all reviewed in electronic medical record.  No pertanent information unless stated regarding to the chief complaint.   Past Medical History:  Diagnosis Date   Allergy    Asthma    Hx of migraines 01/012003   can tell when they come on, treats with excedrin migraine.   Scoliosis 10/24/1995   Vitamin D  deficiency     Allergies  Allergen Reactions   Kiwi Extract Anaphylaxis   Prednisone      Tingling in hands and face   Meloxicam      heartburn     Review of Systems:  No headache, visual changes, nausea, vomiting, diarrhea, constipation, dizziness, abdominal pain, skin rash, fevers, chills, night sweats, weight loss, swollen lymph nodes, body aches, joint swelling, chest pain, shortness of breath, mood changes. POSITIVE muscle aches  Objective  Blood pressure 110/82, pulse 79, height 5' 3 (1.6 m), weight 132 lb (59.9 kg).   General: No apparent  distress alert and oriented x3 mood and affect normal, dressed appropriately.  HEENT: Pupils equal, extraocular movements intact  Respiratory: Patient's speak in full sentences and does not appear short of breath  Cardiovascular: No lower extremity edema, non tender, no erythema  Neck exam does have significant tightness more on the right side of the neck.  Fairly severe overall.  Some limited sidebending noted.  Patient rotation seems improved on the left but not on the right.  Osteopathic findings  C2 flexed rotated and side bent right C6 flexed rotated and side bent right T3 extended rotated and side bent right inhaled rib T9 extended rotated and side bent left L2 flexed rotated and side bent right Sacrum right on right       Assessment and Plan:  DDD (degenerative disc disease), cervical Known degenerative disc disease, attempted osteopathic manipulation, discussed icing regimen and home exercises, discussed which activities to do and which ones to avoid.  Increase activity slowly.  Discussed icing regimen.  Patient did respond well to osteopathic manipulation today.  Follow-up with me again in 6 to 8 weeks otherwise.    Nonallopathic problems  Decision today to treat with OMT was based on Physical Exam  After verbal consent patient was treated with HVLA, ME, FPR techniques in cervical, rib, thoracic, lumbar, and sacral  areas  Patient tolerated the procedure well with improvement in symptoms  Patient given exercises, stretches and lifestyle modifications  See medications in patient instructions  if given  Patient will follow up in 4-8 weeks     The above documentation has been reviewed and is accurate and complete Madison [name] M Madison Hoke, DO         Note: This dictation was prepared with Dragon dictation along with smaller phrase technology. Any transcriptional errors that result from this process are unintentional.

## 2024-06-27 ENCOUNTER — Encounter: Payer: Self-pay | Admitting: Family Medicine

## 2024-06-27 ENCOUNTER — Ambulatory Visit: Admitting: Family Medicine

## 2024-06-27 VITALS — BP 110/82 | HR 79 | Ht 63.0 in | Wt 132.0 lb

## 2024-06-27 DIAGNOSIS — M9908 Segmental and somatic dysfunction of rib cage: Secondary | ICD-10-CM

## 2024-06-27 DIAGNOSIS — M9903 Segmental and somatic dysfunction of lumbar region: Secondary | ICD-10-CM

## 2024-06-27 DIAGNOSIS — M5030 Other cervical disc degeneration, unspecified cervical region: Secondary | ICD-10-CM | POA: Diagnosis not present

## 2024-06-27 DIAGNOSIS — M9904 Segmental and somatic dysfunction of sacral region: Secondary | ICD-10-CM | POA: Diagnosis not present

## 2024-06-27 DIAGNOSIS — F411 Generalized anxiety disorder: Secondary | ICD-10-CM | POA: Diagnosis not present

## 2024-06-27 DIAGNOSIS — M9902 Segmental and somatic dysfunction of thoracic region: Secondary | ICD-10-CM

## 2024-06-27 DIAGNOSIS — M9901 Segmental and somatic dysfunction of cervical region: Secondary | ICD-10-CM

## 2024-06-27 NOTE — Assessment & Plan Note (Signed)
 Known degenerative disc disease, attempted osteopathic manipulation, discussed icing regimen and home exercises, discussed which activities to do and which ones to avoid.  Increase activity slowly.  Discussed icing regimen.  Patient did respond well to osteopathic manipulation today.  Follow-up with me again in 6 to 8 weeks otherwise.

## 2024-06-27 NOTE — Patient Instructions (Signed)
 Good to see you Love idea of cycling 1 finger breath higher than fitted on handle bars Matt Clancey E3 See me in 6 weeks

## 2024-07-03 ENCOUNTER — Ambulatory Visit (HOSPITAL_BASED_OUTPATIENT_CLINIC_OR_DEPARTMENT_OTHER): Admitting: Physical Therapy

## 2024-07-03 ENCOUNTER — Encounter (HOSPITAL_BASED_OUTPATIENT_CLINIC_OR_DEPARTMENT_OTHER): Payer: Self-pay | Admitting: Physical Therapy

## 2024-07-03 DIAGNOSIS — G8929 Other chronic pain: Secondary | ICD-10-CM | POA: Diagnosis not present

## 2024-07-03 DIAGNOSIS — R293 Abnormal posture: Secondary | ICD-10-CM | POA: Diagnosis not present

## 2024-07-03 DIAGNOSIS — M25511 Pain in right shoulder: Secondary | ICD-10-CM | POA: Diagnosis not present

## 2024-07-03 NOTE — Therapy (Unsigned)
 OUTPATIENT PHYSICAL THERAPY TREATMENT   Patient Name: Madison [name] MRN: [id number] DOB:04/06/1986, 38 y.o., female Today's Date: 07/04/2024  END OF SESSION:  PT End of Session - 07/03/24 1605     Visit Number 18    Number of Visits 25    Date for PT Re-Evaluation 08/23/24    Authorization Type [facility] [employer]    PT Start Time 1604    PT Stop Time 1647    PT Time Calculation (min) 43 min    Activity Tolerance Patient tolerated treatment well    Behavior During Therapy [facility] for tasks assessed/performed                 Past Medical History:  Diagnosis Date   Allergy    Asthma    Hx of migraines 01/012003   can tell when they come on, treats with excedrin migraine.   Scoliosis 10/24/1995   Vitamin D  deficiency    Past Surgical History:  Procedure Laterality Date   partial spinal fusion C7-T2 due to scoliosis 03/1998 N/A 04/11/1998   TONSILLECTOMY     WISDOM TOOTH EXTRACTION     Patient Active Problem List   Diagnosis Date Noted   Anogenital warts 06/05/2024   DDD (degenerative disc disease), cervical 05/05/2024   Fatigue 10/05/2023   Right shoulder pain 04/24/2023   Hyperlipidemia 02/25/2023   Arthralgia of right acromioclavicular joint 11/28/2022   Puncture wound of skin from metal nail 07/27/2022   B12 deficiency 02/08/2022   Vitamin D  deficiency 02/08/2022   Allergy, unspecified, initial encounter 01/25/2022   Laceration without foreign body of other finger without damage to nail, initial encounter 01/25/2022   Carpal tunnel syndrome on right 11/07/2021   Dorsalgia, unspecified 08/21/2021   Migraine, unspecified, not intractable, without status migrainosus 08/21/2021   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   Chronic urticaria 05/11/2021   Other adverse food reactions, not elsewhere classified, subsequent encounter 05/11/2021   Seasonal and perennial allergic rhinitis 05/11/2021   Mild intermittent asthma without complication 02/02/2021    Trigger point of right shoulder region 04/07/2020   Nonallopathic lesion of lumbosacral region 11/07/2018   Nonallopathic lesion of sacral region 11/07/2018   Nonallopathic lesion of thoracic region 11/07/2018   Volar plate injury of finger 01/22/2018   Scoliosis 08/21/2017    REFERRING PROVIDER: Bonner Hair, MD  REFERRING DIAG: Rt shoulder posterior labrum tear  Rationale for Evaluation and Treatment: Rehabilitation  THERAPY DIAG:  Chronic right shoulder pain  Abnormal posture  ONSET DATE: chronic back pain with subacute on chronic shoulder pain (March 2024)   SUBJECTIVE:  SUBJECTIVE STATEMENT:  I find that after I ride and lay prone, squeeze my shoulder blades, get the pop on the right- it feels better. Got a new bike. Left side of the neck got tight on Sunday and whole back was tight.   Neuro surgeon thinks that thoracic outlet syndrome is primary problem.   Cervical epidural on 03/31/24 Rt C6-C7. Repeat injection on 7/18.    EVAL: Have been seeing Dr claudene for about 6 years due to scoliosis. Neck pain without N/T. Rt-handed.   PERTINENT HISTORY:  Scoliosis with fusions that began at 38 yo   PAIN:  Are you having pain? Yes: NPRS scale: 3 at rest, 6 at worst Pain location: Rt shoulder into neck Pain description: ache Aggravating factors: dressing/undressing- rotation motion, laying on the shoulder, aches by end of day at computer Relieving factors: ice  PRECAUTIONS:  None  RED FLAGS: None   WEIGHT BEARING RESTRICTIONS:  No  FALLS:  Has patient fallen in last 6 months? No  OCCUPATION:  [employer]  PLOF:  Independent  PATIENT GOALS:  Avoid surgery, decr pain   OBJECTIVE:  Note: Objective measures were completed at Evaluation unless otherwise  noted.  DIAGNOSTIC FINDINGS:  MRI 01/23/24: IMPRESSION: 1. Findings suggestive of a nondisplaced tear at the base of the posteroinferior labrum. 2. No rotator cuff tear identified. MRI 03/24/24: IMPRESSION: 1. Cervical spondylosis as outlined within the body of the report. 2. At C5-C6, there is moderate disc degeneration. A disc protrusion contributes to mild-to-moderate spinal canal stenosis to the right (with mild spinal cord flattening). Mild relative right neural foraminal narrowing also present at this level. 3. At C6-C7, there is moderate disc degeneration. A disc protrusion results in mild-to-moderate spinal canal stenosis (greater to the right) with mild spinal cord flattening. 4. No significant spinal canal or foraminal stenosis at the remaining cervical levels. 5. Multilevel facet arthropathy, greatest on the right at C4-C5 (moderate) and on the right at C5-C6 (mild-to-moderate). 6. Nonspecific reversal of the expected cervical lordosis. 7. Levocurvature of the cervical spine. 8. Grade 1 anterolisthesis at C3-C4 and C4-C5.  PATIENT SURVEYS:  [name] 22.73 8/7: Quick Dash 18.18   POSTURE:  Thoracic dextro/lumbar levoscoliosis with Rt shoulder elevation and scapular winging  HAND DOMINANCE:  Right    Body Part #1 Shoulder   UPPER EXTREMITY MMT: EVAL: Able to demo strength hold against resistance with poor scapular control.   MMT lb Right 9/2  Left 9/2  Shoulder flexion    Shoulder extension    Shoulder abduction    Shoulder adduction    Shoulder internal rotation    Shoulder external rotation    Elbow flexion    Elbow extension    (Blank rows = not tested)   9/2 cervical sidebend Rt 28 p! / Lt 34, rotation Rt 42 p!/ Lt 48                                                                                                             TREATMENT DATE:  9/11 Manual: STM bilateral cervical paraspinals at C3-4  Cervical distraction  STM Rt upper trap,  levator, scalenes, first rib depression mobilization on Rt Lt sidelying, towel roll under Rt lumbar spine, hand on hip- scap retraction; also added ball bw ankles and iso clam Lt sidelying 90/90 ball roll, towel undre Rt lumbar spine, horiz abd at 80 deg UE hold Supine 90/90 LE dead bug Supine 90/90 LE rotation- ball   9/2 STM Rt scalenes, upper trap Cervical distraction with Rt first rib depression + sidebend, + rotation Lower cervical Rt to Lt mobs  Post manual: cervical sidebend Rt 36 / Lt 42, rotation Rt 50 / Lt 60 First rib mob with sheet Discussed biking  8/12 STM Rt upper trap, levator, periscap, passive scapular mobs in prone Prone retraction Sidelying retraction with resist by PT, abd, ER Discussed stretches/workout while on plane and throughout trip  8/7 MANUAL STM scalenes, levator, upper trap on Rt; Rt STM lats with stretching arm OH and blocking scapular motion; cervical traction; grade 2 Lt<>Rt cervical mobilizations Progression of supine pilates: lower core lift, table top with turnout, alt ext, alt drop out Discussed supine exercise for shoulder & awareness of Rt scap retraction/depression  7/29 STM upper trap, Rt suboccipitals, cervical distraction Cable row- feet on ground #10 Lat pull down- trial of long bar & short, wide grip #10 Cable biceps curl with ready stance #5 Cable triceps press with hip hinge #5 Discussed cycling & posture at home      PATIENT EDUCATION:  Education details: Teacher, music of condition, POC, HEP, exercise form/rationale Person educated: Patient Education method: Programmer, multimedia, Demonstration, Tactile cues, Verbal cues, and Handouts Education comprehension: verbalized understanding, returned demonstration, verbal cues required, tactile cues required, and needs further education  HOME EXERCISE PROGRAM: Access Code: [email or web address] URL: [email or web address] Date: 02/15/2024 Prepared by: Harlene Cordon    ASSESSMENT:  CLINICAL IMPRESSION: Use of sidelying exercises for Rt hip abd and Lt oblique activation and decrease scapular winging in Rt retraction. Will begin to ride a mobile bike which will require additional control for balance and steering.   REHAB POTENTIAL: Good  CLINICAL DECISION MAKING: Stable/uncomplicated  EVALUATION COMPLEXITY: Low   GOALS: Goals reviewed with patient? Yes  SHORT TERM GOALS: Target date: 5/24  Able to demo scapular control with UE MMT Baseline: Goal status: met  2.  Verbalize ability to correct posture throughout the day, particularly at work with her right arm reaching forward to a mouse/keyboard Baseline:  Goal status: met  3.  Begin incorporating UE strength back into regular gym workout Baseline:  Goal status: partially met- low level for continued neuro re-ed    LONG TERM GOALS: Target date: POC date  Able to strengthen upper body during regular workouts with minimal to no discomfort in shoulder Baseline:  Goal status: INITIAL  2.  Quick Hollis to improve by MDC Baseline:  Goal status: INITIAL  3.  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  Able to maintain minimal to no pain throughout regular daily activities Baseline:  Goal status: INITIAL    PLAN:  PT FREQUENCY: 1-2x/week  PT DURATION: POC date  PLANNED INTERVENTIONS: 97164- PT Re-evaluation, 97750- Physical Performance Testing, 97110-Therapeutic exercises, 97530- Therapeutic activity, [id number]- Neuromuscular re-education, 97535- Self Care, 02859- Manual therapy, [phone]- Aquatic Therapy, Patient/Family education, Taping, Dry Needling, Joint mobilization, Spinal mobilization, Scar  mobilization, Cryotherapy, and Moist heat.  PLAN FOR NEXT SESSION: STM/DN PRN, continue length and activation along the Rt to Lt post diagonal  chain   Ermina Oberman C. Aanshi Batchelder PT, DPT 07/04/24 1:40 PM

## 2024-07-10 ENCOUNTER — Ambulatory Visit (HOSPITAL_BASED_OUTPATIENT_CLINIC_OR_DEPARTMENT_OTHER): Admitting: Physical Therapy

## 2024-07-10 ENCOUNTER — Encounter (HOSPITAL_BASED_OUTPATIENT_CLINIC_OR_DEPARTMENT_OTHER): Payer: Self-pay | Admitting: Physical Therapy

## 2024-07-10 DIAGNOSIS — M25511 Pain in right shoulder: Secondary | ICD-10-CM | POA: Diagnosis not present

## 2024-07-10 DIAGNOSIS — G8929 Other chronic pain: Secondary | ICD-10-CM | POA: Diagnosis not present

## 2024-07-10 DIAGNOSIS — R293 Abnormal posture: Secondary | ICD-10-CM

## 2024-07-10 NOTE — Therapy (Signed)
 OUTPATIENT PHYSICAL THERAPY TREATMENT   Patient Name: Madison [name] MRN: [id number] DOB:04/10/1986, 38 y.o., female Today's Date: 07/10/2024  END OF SESSION:  PT End of Session - 07/10/24 1603     Visit Number 19    Number of Visits 25    Date for Recertification  08/23/24    Authorization Type [facility] [employer]    PT Start Time 1602    PT Stop Time 1642    PT Time Calculation (min) 40 min    Activity Tolerance Patient tolerated treatment well    Behavior During Therapy [facility] for tasks assessed/performed                  Past Medical History:  Diagnosis Date   Allergy    Asthma    Hx of migraines 01/012003   can tell when they come on, treats with excedrin migraine.   Scoliosis 10/24/1995   Vitamin D  deficiency    Past Surgical History:  Procedure Laterality Date   partial spinal fusion C7-T2 due to scoliosis 03/1998 N/A 04/11/1998   TONSILLECTOMY     WISDOM TOOTH EXTRACTION     Patient Active Problem List   Diagnosis Date Noted   Anogenital warts 06/05/2024   DDD (degenerative disc disease), cervical 05/05/2024   Fatigue 10/05/2023   Right shoulder pain 04/24/2023   Hyperlipidemia 02/25/2023   Arthralgia of right acromioclavicular joint 11/28/2022   Puncture wound of skin from metal nail 07/27/2022   B12 deficiency 02/08/2022   Vitamin D  deficiency 02/08/2022   Allergy, unspecified, initial encounter 01/25/2022   Laceration without foreign body of other finger without damage to nail, initial encounter 01/25/2022   Carpal tunnel syndrome on right 11/07/2021   Dorsalgia, unspecified 08/21/2021   Migraine, unspecified, not intractable, without status migrainosus 08/21/2021   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   Chronic urticaria 05/11/2021   Other adverse food reactions, not elsewhere classified, subsequent encounter 05/11/2021   Seasonal and perennial allergic rhinitis 05/11/2021   Mild intermittent asthma without complication 02/02/2021    Trigger point of right shoulder region 04/07/2020   Nonallopathic lesion of lumbosacral region 11/07/2018   Nonallopathic lesion of sacral region 11/07/2018   Nonallopathic lesion of thoracic region 11/07/2018   Volar plate injury of finger 01/22/2018   Scoliosis 08/21/2017    REFERRING PROVIDER: Bonner Hair, MD  REFERRING DIAG: Rt shoulder posterior labrum tear  Rationale for Evaluation and Treatment: Rehabilitation  THERAPY DIAG:  Chronic right shoulder pain  Abnormal posture  ONSET DATE: chronic back pain with subacute on chronic shoulder pain (March 2024)   SUBJECTIVE:  SUBJECTIVE STATEMENT:  Rt side of the neck is tight- thought yesterday that everything was coming back. Rt hip and lower back did not like the exercises. Did an outdoor bike ride and had so much fun. Going again this weekend.   Neuro surgeon thinks that thoracic outlet syndrome is primary problem.   Cervical epidural on 03/31/24 Rt C6-C7. Repeat injection on 7/18.    EVAL: Have been seeing Dr claudene for about 6 years due to scoliosis. Neck pain without N/T. Rt-handed.   PERTINENT HISTORY:  Scoliosis with fusions that began at 38 yo   PAIN:  Are you having pain? Yes: NPRS scale: 3 at rest, 6 at worst Pain location: Rt shoulder into neck Pain description: ache Aggravating factors: dressing/undressing- rotation motion, laying on the shoulder, aches by end of day at computer Relieving factors: ice  PRECAUTIONS:  None  RED FLAGS: None   WEIGHT BEARING RESTRICTIONS:  No  FALLS:  Has patient fallen in last 6 months? No  OCCUPATION:  [employer] for [employer]  PLOF:  Independent  PATIENT GOALS:  Avoid surgery, decr pain   OBJECTIVE:  Note: Objective measures were completed at Evaluation unless otherwise  noted.  DIAGNOSTIC FINDINGS:  MRI 01/23/24: IMPRESSION: 1. Findings suggestive of a nondisplaced tear at the base of the posteroinferior labrum. 2. No rotator cuff tear identified. MRI 03/24/24: IMPRESSION: 1. Cervical spondylosis as outlined within the body of the report. 2. At C5-C6, there is moderate disc degeneration. A disc protrusion contributes to mild-to-moderate spinal canal stenosis to the right (with mild spinal cord flattening). Mild relative right neural foraminal narrowing also present at this level. 3. At C6-C7, there is moderate disc degeneration. A disc protrusion results in mild-to-moderate spinal canal stenosis (greater to the right) with mild spinal cord flattening. 4. No significant spinal canal or foraminal stenosis at the remaining cervical levels. 5. Multilevel facet arthropathy, greatest on the right at C4-C5 (moderate) and on the right at C5-C6 (mild-to-moderate). 6. Nonspecific reversal of the expected cervical lordosis. 7. Levocurvature of the cervical spine. 8. Grade 1 anterolisthesis at C3-C4 and C4-C5.  PATIENT SURVEYS:  [name] 22.73 8/7: Quick Dash 18.18   POSTURE:  Thoracic dextro/lumbar levoscoliosis with Rt shoulder elevation and scapular winging  HAND DOMINANCE:  Right    Body Part #1 Shoulder   UPPER EXTREMITY MMT: EVAL: Able to demo strength hold against resistance with poor scapular control.   MMT lb Right 9/2  Left 9/2  Shoulder flexion    Shoulder extension    Shoulder abduction    Shoulder adduction    Shoulder internal rotation    Shoulder external rotation    Elbow flexion    Elbow extension    (Blank rows = not tested)   9/2 cervical sidebend Rt 28 p! 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 Discussed dynadisk  & mobility with sitting posture Roller and STM Rt hip and lateral leg    9/11 Manual: STM bilateral cervical paraspinals at C3-4  Cervical distraction  STM Rt upper trap, levator, scalenes, first rib depression mobilization on Rt Lt sidelying, towel roll under Rt lumbar spine, hand on hip- scap retraction; also added ball bw ankles and iso clam Lt sidelying 90/90 ball roll, towel undre Rt lumbar spine, horiz abd at 80 deg UE hold Supine 90/90 LE dead bug Supine 90/90 LE rotation- ball   9/2 STM Rt scalenes, upper trap Cervical distraction with Rt first rib depression + sidebend, + rotation Lower cervical Rt to Lt mobs  Post manual: cervical sidebend Rt 36 / Lt 42, rotation Rt 50 / Lt 60 First rib mob with sheet Discussed biking   PATIENT EDUCATION:  Education details: Teacher, music of condition, POC, HEP, exercise form/rationale Person educated: Patient Education method: Explanation, Demonstration, Tactile cues, Verbal cues, and Handouts Education comprehension: verbalized understanding, returned demonstration, verbal cues required, tactile cues required, and needs further education  HOME EXERCISE PROGRAM: Access Code: [email or web address] URL: [email or web address] Date: 02/15/2024 Prepared by: Harlene Cordon    ASSESSMENT:  CLINICAL IMPRESSION: Concordant pain found in trigger points of right hip and into lateral quads/HS indicating need to continue exercises but decrease intensity & reps. Will continue cycling. Does feel better when sitting on bosu ball due to allowed mobility- discussed wobble stool vs dynadisk.   REHAB POTENTIAL: Good  CLINICAL DECISION MAKING: Stable/uncomplicated  EVALUATION COMPLEXITY: Low   GOALS: Goals reviewed with patient? Yes  SHORT TERM GOALS: Target date: 5/24  Able to demo scapular control with UE MMT Baseline: Goal status: met  2.  Verbalize ability to correct posture throughout the day, particularly at work with her right arm  reaching forward to a mouse/keyboard Baseline:  Goal status: met  3.  Begin incorporating UE strength back into regular gym workout Baseline:  Goal status: partially met- low level for continued neuro re-ed    LONG TERM GOALS: Target date: POC date  Able to strengthen upper body during regular workouts with minimal to no discomfort in shoulder Baseline:  Goal status: INITIAL  2.  Quick Hollis to improve by MDC Baseline:  Goal status: INITIAL  3.  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  Able to maintain minimal to no pain throughout regular daily activities Baseline:  Goal status: INITIAL    PLAN:  PT FREQUENCY: 1-2x/week  PT DURATION: POC date  PLANNED INTERVENTIONS: 97164- PT Re-evaluation, 97750- Physical Performance Testing, 97110-Therapeutic exercises, 97530- Therapeutic activity, [id number]- Neuromuscular re-education, 97535- Self Care, [id number]- Manual therapy, [phone]- Aquatic Therapy, Patient/Family education, Taping, Dry Needling, Joint mobilization, Spinal mobilization, Scar mobilization, Cryotherapy, and Moist heat.  PLAN FOR NEXT SESSION: STM/DN PRN, continue length and activation along the Rt to Lt post diagonal chain   Niah Heinle C. Arlind Klingerman PT, DPT 07/10/24 5:10 PM

## 2024-07-11 ENCOUNTER — Other Ambulatory Visit: Payer: Self-pay | Admitting: Family Medicine

## 2024-07-11 ENCOUNTER — Other Ambulatory Visit (HOSPITAL_COMMUNITY): Payer: Self-pay

## 2024-07-11 MED ORDER — VITAMIN D (ERGOCALCIFEROL) 1.25 MG (50000 UNIT) PO CAPS
50000.0000 [IU] | ORAL_CAPSULE | ORAL | 0 refills | Status: DC
  Filled 2024-07-11: qty 12, 84d supply, fill #0

## 2024-07-14 ENCOUNTER — Encounter: Payer: Self-pay | Admitting: Family Medicine

## 2024-07-17 ENCOUNTER — Encounter (HOSPITAL_BASED_OUTPATIENT_CLINIC_OR_DEPARTMENT_OTHER): Payer: Self-pay | Admitting: Physical Therapy

## 2024-07-17 ENCOUNTER — Ambulatory Visit (HOSPITAL_BASED_OUTPATIENT_CLINIC_OR_DEPARTMENT_OTHER): Admitting: Physical Therapy

## 2024-07-17 DIAGNOSIS — G8929 Other chronic pain: Secondary | ICD-10-CM

## 2024-07-17 DIAGNOSIS — R293 Abnormal posture: Secondary | ICD-10-CM

## 2024-07-17 DIAGNOSIS — M25511 Pain in right shoulder: Secondary | ICD-10-CM | POA: Diagnosis not present

## 2024-07-17 DIAGNOSIS — F411 Generalized anxiety disorder: Secondary | ICD-10-CM | POA: Diagnosis not present
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
## 2024-07-21 ENCOUNTER — Encounter: Payer: Self-pay | Admitting: Internal Medicine

## 2024-07-23 ENCOUNTER — Other Ambulatory Visit (HOSPITAL_BASED_OUTPATIENT_CLINIC_OR_DEPARTMENT_OTHER): Payer: Self-pay

## 2024-07-24 ENCOUNTER — Other Ambulatory Visit (HOSPITAL_BASED_OUTPATIENT_CLINIC_OR_DEPARTMENT_OTHER): Payer: Self-pay

## 2024-07-24 DIAGNOSIS — R102 Pelvic and perineal pain unspecified side: Secondary | ICD-10-CM | POA: Diagnosis not present

## 2024-07-24 DIAGNOSIS — N921 Excessive and frequent menstruation with irregular cycle: Secondary | ICD-10-CM | POA: Diagnosis not present

## 2024-07-24 DIAGNOSIS — N760 Acute vaginitis: Secondary | ICD-10-CM | POA: Diagnosis not present

## 2024-07-24 MED ORDER — FLUCONAZOLE 150 MG PO TABS
150.0000 mg | ORAL_TABLET | ORAL | 0 refills | Status: DC
  Filled 2024-07-24: qty 2, 6d supply, fill #0

## 2024-07-24 MED ORDER — DOXYCYCLINE MONOHYDRATE 100 MG PO TABS
100.0000 mg | ORAL_TABLET | Freq: Two times a day (BID) | ORAL | 0 refills | Status: AC
  Filled 2024-07-24: qty 14, 7d supply, fill #0

## 2024-07-25 ENCOUNTER — Ambulatory Visit (HOSPITAL_BASED_OUTPATIENT_CLINIC_OR_DEPARTMENT_OTHER): Attending: Orthopaedic Surgery | Admitting: Physical Therapy

## 2024-07-25 ENCOUNTER — Encounter (HOSPITAL_BASED_OUTPATIENT_CLINIC_OR_DEPARTMENT_OTHER): Payer: Self-pay | Admitting: Physical Therapy

## 2024-07-25 DIAGNOSIS — G8929 Other chronic pain: Secondary | ICD-10-CM | POA: Insufficient documentation

## 2024-07-25 DIAGNOSIS — R293 Abnormal posture: Secondary | ICD-10-CM | POA: Diagnosis present

## 2024-07-25 DIAGNOSIS — M25511 Pain in right shoulder: Secondary | ICD-10-CM | POA: Insufficient documentation

## 2024-07-25 DIAGNOSIS — F411 Generalized anxiety disorder: Secondary | ICD-10-CM | POA: Diagnosis not present
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
## 2024-07-28 ENCOUNTER — Ambulatory Visit (HOSPITAL_BASED_OUTPATIENT_CLINIC_OR_DEPARTMENT_OTHER): Admitting: Physical Therapy

## 2024-07-28 ENCOUNTER — Encounter (HOSPITAL_BASED_OUTPATIENT_CLINIC_OR_DEPARTMENT_OTHER): Payer: Self-pay | Admitting: Physical Therapy

## 2024-07-28 DIAGNOSIS — R293 Abnormal posture: Secondary | ICD-10-CM

## 2024-07-28 DIAGNOSIS — G8929 Other chronic pain: Secondary | ICD-10-CM

## 2024-07-28 DIAGNOSIS — M25511 Pain in right shoulder: Secondary | ICD-10-CM | POA: Diagnosis not present
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
## 2024-07-30 DIAGNOSIS — F411 Generalized anxiety disorder: Secondary | ICD-10-CM | POA: Diagnosis not present

## 2024-07-31 ENCOUNTER — Ambulatory Visit (HOSPITAL_BASED_OUTPATIENT_CLINIC_OR_DEPARTMENT_OTHER): Admitting: Physical Therapy

## 2024-07-31 ENCOUNTER — Encounter (HOSPITAL_BASED_OUTPATIENT_CLINIC_OR_DEPARTMENT_OTHER): Payer: Self-pay | Admitting: Physical Therapy

## 2024-07-31 DIAGNOSIS — M25511 Pain in right shoulder: Secondary | ICD-10-CM | POA: Diagnosis not present

## 2024-07-31 DIAGNOSIS — G8929 Other chronic pain: Secondary | ICD-10-CM

## 2024-07-31 DIAGNOSIS — R293 Abnormal posture: Secondary | ICD-10-CM

## 2024-07-31 NOTE — Therapy (Signed)
 OUTPATIENT PHYSICAL THERAPY TREATMENT   Patient Name: Madison [name] MRN: [id number] DOB:10/16/1986, 38 y.o., female Today's Date: 07/31/2024  END OF SESSION:  PT End of Session - 07/31/24 1154     Visit Number 23    Number of Visits 25    Date for Recertification  08/23/24    Authorization Type [facility] [employer]    PT Start Time 1154    PT Stop Time 1232    PT Time Calculation (min) 38 min    Activity Tolerance Patient tolerated treatment well    Behavior During Therapy [facility] for tasks assessed/performed                   Past Medical History:  Diagnosis Date   Allergy    Asthma    Hx of migraines 01/012003   can tell when they come on, treats with excedrin migraine.   Scoliosis 10/24/1995   Vitamin D  deficiency    Past Surgical History:  Procedure Laterality Date   partial spinal fusion C7-T2 due to scoliosis 03/1998 N/A 04/11/1998   TONSILLECTOMY     WISDOM TOOTH EXTRACTION     Patient Active Problem List   Diagnosis Date Noted   DDD (degenerative disc disease), cervical 05/05/2024   Fatigue 10/05/2023   Right shoulder pain 04/24/2023   Hyperlipidemia 02/25/2023   Arthralgia of right acromioclavicular joint 11/28/2022   Puncture wound of skin from metal nail 07/27/2022   B12 deficiency 02/08/2022   Vitamin D  deficiency 02/08/2022   Allergy, unspecified, initial encounter 01/25/2022   Laceration without foreign body of other finger without damage to nail, initial encounter 01/25/2022   Carpal tunnel syndrome on right 11/07/2021   Dorsalgia, unspecified 08/21/2021   Migraine, unspecified, not intractable, without status migrainosus 08/21/2021   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   Chronic urticaria 05/11/2021   Other adverse food reactions, not elsewhere classified, subsequent encounter 05/11/2021   Seasonal and perennial allergic rhinitis 05/11/2021   Mild intermittent asthma without complication 02/02/2021   Trigger point of right shoulder  region 04/07/2020   Nonallopathic lesion of lumbosacral region 11/07/2018   Nonallopathic lesion of sacral region 11/07/2018   Nonallopathic lesion of thoracic region 11/07/2018   Volar plate injury of finger 01/22/2018   Scoliosis 08/21/2017    REFERRING PROVIDER: Bonner Hair, MD  REFERRING DIAG: Rt shoulder posterior labrum tear  Rationale for Evaluation and Treatment: Rehabilitation  THERAPY DIAG:  Chronic right shoulder pain  Abnormal posture  ONSET DATE: chronic back pain with subacute on chronic shoulder pain (March 2024)   SUBJECTIVE:  SUBJECTIVE STATEMENT:  Symptoms are intermittent down arm, neck and shoulder pain did not come back. Neck cracked on Lt side after bike ride on Monday and it felt wonderful.   Cervical epidural on 03/31/24 Rt C6-C7. Repeat injection on 7/18.    EVAL: Have been seeing Madison [name] for about 6 years due to scoliosis. Neck pain without N/T. Rt-handed.   PERTINENT HISTORY:  Scoliosis with fusions that began at 38 yo   PAIN:  Are you having pain? Yes: NPRS scale: 3 at rest, 6 at worst Pain location: Rt shoulder into neck Pain description: ache Aggravating factors: dressing/undressing- rotation motion, laying on the shoulder, aches by end of day at computer Relieving factors: ice  PRECAUTIONS:  None  RED FLAGS: None   WEIGHT BEARING RESTRICTIONS:  No  FALLS:  Has patient fallen in last 6 months? No  OCCUPATION:  [employer]  PLOF:  Independent  PATIENT GOALS:  Avoid surgery, decr pain   OBJECTIVE:  Note: Objective measures were completed at Evaluation unless otherwise noted.  DIAGNOSTIC FINDINGS:  MRI 01/23/24: IMPRESSION: 1. Findings suggestive of a nondisplaced tear at the base of the posteroinferior labrum. 2. No rotator  cuff tear identified. MRI 03/24/24: IMPRESSION: 1. Cervical spondylosis as outlined within the body of the report. 2. At C5-C6, there is moderate disc degeneration. A disc protrusion contributes to mild-to-moderate spinal canal stenosis to the right (with mild spinal cord flattening). Mild relative right neural foraminal narrowing also present at this level. 3. At C6-C7, there is moderate disc degeneration. A disc protrusion results in mild-to-moderate spinal canal stenosis (greater to the right) with mild spinal cord flattening. 4. No significant spinal canal or foraminal stenosis at the remaining cervical levels. 5. Multilevel facet arthropathy, greatest on the right at C4-C5 (moderate) and on the right at C5-C6 (mild-to-moderate). 6. Nonspecific reversal of the expected cervical lordosis. 7. Levocurvature of the cervical spine. 8. Grade 1 anterolisthesis at C3-C4 and C4-C5.  PATIENT SURVEYS:  Madison [name] 22.73 8/7: Quick Dash 18.18 9/25: Quick Dash    POSTURE:  Thoracic dextro/lumbar levoscoliosis with Rt shoulder elevation and scapular winging  HAND DOMINANCE:  Right    Body Part #1 Shoulder   UPPER EXTREMITY MMT: EVAL: Able to demo strength hold against resistance with poor scapular control.   MMT lb Right 9/25  Left 9/25  Shoulder flexion 8.5 9.1  Shoulder extension    Shoulder abduction 10.9 12.2  Shoulder adduction    Shoulder internal rotation 10.9 11.7  Shoulder external rotation 12.2 8.7  Elbow flexion    Elbow extension    (Blank rows = not tested)   9/2 cervical sidebend Rt 28 p! / Lt 34, rotation Rt 42 p!/ Lt 48 9/25: cervical sidebend Lt = pull on right upper trap; sidebend Rt = pull on left but pinch on Rt                                                                                                             TREATMENT DATE:  Treatment                            10/9: Blank lines following charge title = not provided on this treatment date.    Manual:  TPDN No Rt levator scap & upper trap Supine Lt cervical paraspinal STM Suboccipital release and gentle manual traction There-ex:  There-Act:  Self Care: Edu on use of self traction Nuro-Re-ed: Prone retraction, +W, +cervical rotation   Primal push up Gait Training:    Treatment                            10/6: Blank lines following charge title = not provided on this treatment date.   Manual:  TPDN YES Trigger Point Dry Needling  Subsequent Treatment: Instructions provided previously at initial dry needling treatment.   Patient Verbal Consent Given: Yes Education Handout Provided: Previously Provided Muscles Treated: Rt upper trap, Rt C4-5 paraspinals Electrical Stimulation Performed: No Treatment Response/Outcome: twitch with decreased concordant tightness Prone rib mobilization Seated cervical rotation with self mob of cervical spine to reduce sidebend There-ex:  There-Act:  Self Care:  Nuro-Re-ed: Prone cervical retraction, + UE W, + cervical rotation- assisted by PT Gait Training:    Treatment                            10/3: Blank lines following charge title = not provided on this treatment date.   Manual:  TPDN YES Trigger Point Dry Needling  Subsequent Treatment: Instructions provided previously at initial dry needling treatment.   Patient Verbal Consent Given: Yes Education Handout Provided: Previously Provided Muscles Treated: bilateral upper trap & levator scap, Rt C3-5 paraspinals Electrical Stimulation Performed: No Treatment Response/Outcome: twitch with decreased concordant spasm, decreased distal sensory changes Prone rib mobilizations Supine cervical traction Suboccipital release.       PATIENT EDUCATION:  Education details: Teacher, music of condition, POC, HEP, exercise form/rationale Person educated: Patient Education method: Explanation, Demonstration, Tactile cues, Verbal cues, and Handouts Education comprehension:  verbalized understanding, returned demonstration, verbal cues required, tactile cues required, and needs further education  HOME EXERCISE PROGRAM: Access Code: [email or web address] URL: [email or web address] Date: 02/15/2024 Prepared by: Harlene Cordon  Rt hand holding mid cervical vertebrae, head turning right Prone cervical retraction, adding small head turns   ASSESSMENT:  CLINICAL IMPRESSION: No need for TPDN today and symptoms continue to centralize. Progressed exercises for core stability tody.   REHAB POTENTIAL: Good  CLINICAL DECISION MAKING: Stable/uncomplicated  EVALUATION COMPLEXITY: Low   GOALS: Goals reviewed with patient? Yes  SHORT TERM GOALS: Target date: 5/24  Able to demo scapular control with UE MMT Baseline: Goal status: met  2.  Verbalize ability to correct posture throughout the day, particularly at work with her right arm reaching forward to a mouse/keyboard Baseline:  Goal status: met  3.  Begin incorporating UE strength back into regular gym workout Baseline:  Goal status: partially met- low level for continued neuro re-ed    LONG TERM GOALS: Target date: POC date  Able to strengthen upper body during regular workouts with minimal to no discomfort in shoulder Baseline:  Goal status: INITIAL  2.  Quick Hollis to improve by MDC Baseline:  Goal status: INITIAL  3.  Pt will return to trying wheel pottery with postural awareness Baseline: I did a standing one and my shoulder does not like  it Goal status: ongoing, maybe defer depending on pt  4.  Able to maintain minimal to no pain throughout regular daily activities Baseline:  Goal status: INITIAL    PLAN:  PT FREQUENCY: 1-2x/week  PT DURATION: POC date  PLANNED INTERVENTIONS: 97164- PT Re-evaluation, 97750- Physical Performance Testing, 97110-Therapeutic exercises, 97530- Therapeutic activity, [id number]- Neuromuscular re-education, 97535- Self Care, [id number]- Manual therapy, [phone]-  Aquatic Therapy, Patient/Family education, Taping, Dry Needling, Joint mobilization, Spinal mobilization, Scar mobilization, Cryotherapy, and Moist heat.  PLAN FOR NEXT SESSION: STM/DN PRN, continue length and activation along the Rt to Lt post diagonal chain Trial of home traction unit   Hyrum Shaneyfelt C. Troye Hiemstra PT, DPT 07/31/24 12:47 PM

## 2024-08-06 DIAGNOSIS — F411 Generalized anxiety disorder: Secondary | ICD-10-CM | POA: Diagnosis not present

## 2024-08-06 NOTE — Progress Notes (Signed)
 [name] [address] [place] [address] Phone: [phone] Subjective:   ISusannah [name], am serving as a scribe for Dr. Arthea Claudene.  I'm seeing this patient by the request  of:  Geofm Glade PARAS, MD  CC: Back and neck pain follow-up  YEP:Dlagzrupcz  Madison [name] is a 38 y.o. female coming in with complaint of back and neck pain. OMT 06/27/2024. Patient states shoulder not the greatest, but we moving forward.  Medications patient has been prescribed: Effexor , Vit D Taking:    Reviewing patient's chart has been in physical therapy quite significant.     Reviewed prior external information including notes and imaging from previsou exam, outside providers and external EMR if available.   As well as notes that were available from care everywhere and other healthcare systems.  Past medical history, social, surgical and family history all reviewed in electronic medical record.  No pertanent information unless stated regarding to the chief complaint.   Past Medical History:  Diagnosis Date   Allergy    Asthma    Hx of migraines 01/012003   can tell when they come on, treats with excedrin migraine.   Scoliosis 10/24/1995   Vitamin D  deficiency     Allergies  Allergen Reactions   Kiwi Extract Anaphylaxis   Prednisone      Tingling in hands and face   Meloxicam      heartburn     Review of Systems:  No headache, visual changes, nausea, vomiting, diarrhea, constipation, dizziness, abdominal pain, skin rash, fevers, chills, night sweats, weight loss, swollen lymph nodes, body aches, joint swelling, chest pain, shortness of breath, mood changes. POSITIVE muscle aches  Objective  Blood pressure 104/66, pulse 87, height 5' 3 (1.6 m), weight 143 lb (64.9 kg), SpO2 98%.   General: No apparent distress alert and oriented x3 mood and affect normal, dressed appropriately.  HEENT: Pupils equal, extraocular movements intact  Respiratory:  Patient's speak in full sentences and does not appear short of breath  Cardiovascular: No lower extremity edema, non tender, no erythema  Gait relatively normal MSK:  Back significant scoliosis but stable.  Significant in the scapular area with significant tightness going into the paraspinal musculature on the parascapular area.  Osteopathic findings  C2 flexed rotated and side bent right C6 flexed rotated and side bent left T3-T7 neutral rotated left side bent right inhaled rib at T3 and T4 L2 flexed rotated and side bent right Sacrum right on right   After verbal consent prepped with alcohol swab and with a 25-gauge half inch needle injected into 3 distinct trigger points to the right shoulder region including the rhomboid, trapezius, and levator scapula musculature.  Total of 3 cc of 0.5% Marcaine and 1 cc of Kenalog  40 mg/mL used.  Minimal blood loss.  Band-Aid placed.  Postinjection instructions given    Assessment and Plan:  Trigger point of right shoulder region Discussed with patient about icing regimen and home exercises, discussed which activities to do and which ones to avoid.  Increase activity slowly.  Patient is doing much better overall.  Did discuss cycling position that I think will be beneficial.  Responded well to manipulation as well.  Follow-up again in 6 to 8 weeks  DDD (degenerative disc disease), cervical Known arthritic changes, muscle energy accomplished afterwards.  Likely also contributing to some of the shoulder tightness noted on the right side.  No change in medications but still having some difficulty with  sleeping so if have to change Effexor  would go to the twice a day dosing if necessary.    Nonallopathic problems  Decision today to treat with OMT was based on Physical Exam  After verbal consent patient was treated with HVLA, ME, FPR techniques in cervical, rib, thoracic, lumbar, and sacral  areas  Patient tolerated the procedure well with  improvement in symptoms  Patient given exercises, stretches and lifestyle modifications  See medications in patient instructions if given  Patient will follow up in 4-8 weeks       The above documentation has been reviewed and is accurate and complete Shauntay Brunelli M Mekiah Wahler, DO       Note: This dictation was prepared with Dragon dictation along with smaller phrase technology. Any transcriptional errors that result from this process are unintentional.

## 2024-08-07 ENCOUNTER — Encounter (HOSPITAL_BASED_OUTPATIENT_CLINIC_OR_DEPARTMENT_OTHER): Payer: Self-pay | Admitting: Physical Therapy

## 2024-08-07 ENCOUNTER — Ambulatory Visit (HOSPITAL_BASED_OUTPATIENT_CLINIC_OR_DEPARTMENT_OTHER): Admitting: Physical Therapy

## 2024-08-07 DIAGNOSIS — R293 Abnormal posture: Secondary | ICD-10-CM

## 2024-08-07 DIAGNOSIS — M25511 Pain in right shoulder: Secondary | ICD-10-CM | POA: Diagnosis not present

## 2024-08-07 DIAGNOSIS — G8929 Other chronic pain: Secondary | ICD-10-CM

## 2024-08-07 NOTE — Therapy (Signed)
 OUTPATIENT PHYSICAL THERAPY TREATMENT   Patient Name: Madison [name] MRN: [id number] DOB:[birth year], 38 y.o., female Today's Date: 08/07/2024  END OF SESSION:  PT End of Session - 08/07/24 1149     Visit Number 24    Number of Visits 25    Date for Recertification  08/23/24    Authorization Type [facility] [employer]    PT Start Time 1145    PT Stop Time 1230    PT Time Calculation (min) 45 min    Activity Tolerance Patient tolerated treatment well    Behavior During Therapy [facility] for tasks assessed/performed                   Past Medical History:  Diagnosis Date   Allergy    Asthma    Hx of migraines 01/012003   can tell when they come on, treats with excedrin migraine.   Scoliosis 10/24/1995   Vitamin D  deficiency    Past Surgical History:  Procedure Laterality Date   partial spinal fusion C7-T2 due to scoliosis 03/1998 N/A 04/11/1998   TONSILLECTOMY     WISDOM TOOTH EXTRACTION     Patient Active Problem List   Diagnosis Date Noted   DDD (degenerative disc disease), cervical 05/05/2024   Fatigue 10/05/2023   Right shoulder pain 04/24/2023   Hyperlipidemia 02/25/2023   Arthralgia of right acromioclavicular joint 11/28/2022   Puncture wound of skin from metal nail 07/27/2022   B12 deficiency 02/08/2022   Vitamin D  deficiency 02/08/2022   Allergy, unspecified, initial encounter 01/25/2022   Laceration without foreign body of other finger without damage to nail, initial encounter 01/25/2022   Carpal tunnel syndrome on right 11/07/2021   Dorsalgia, unspecified 08/21/2021   Migraine, unspecified, not intractable, without status migrainosus 08/21/2021   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   Chronic urticaria 05/11/2021   Other adverse food reactions, not elsewhere classified, subsequent encounter 05/11/2021   Seasonal and perennial allergic rhinitis 05/11/2021   Mild intermittent asthma without complication 02/02/2021   Trigger point of right shoulder  region 04/07/2020   Nonallopathic lesion of lumbosacral region 11/07/2018   Nonallopathic lesion of sacral region 11/07/2018   Nonallopathic lesion of thoracic region 11/07/2018   Volar plate injury of finger 01/22/2018   Scoliosis 08/21/2017    REFERRING PROVIDER: Bonner Hair, MD  REFERRING DIAG: Rt shoulder posterior labrum tear  Rationale for Evaluation and Treatment: Rehabilitation  THERAPY DIAG:  Chronic right shoulder pain  Abnormal posture  ONSET DATE: chronic back pain with subacute on chronic shoulder pain (March 2024)   SUBJECTIVE:  SUBJECTIVE STATEMENT:  Neck is feeling good. Removed tape Friday evening. It's an achey pain that I know is there but not too bad. I have noticed that low pigtails with my helmet makes my neck really happy. Doing really well with biking and increasing speed.    Cervical epidural on 03/31/24 Rt C6-C7. Repeat injection on 7/18.    EVAL: Have been seeing Dr claudene for about 6 years due to scoliosis. Neck pain without N/T. Rt-handed.   PERTINENT HISTORY:  Scoliosis with fusions that began at 38 yo   PAIN:  Are you having pain? Yes: NPRS scale: 2 Pain location: Rt shoulder into neck Pain description: ache Aggravating factors: dressing/undressing- rotation motion, laying on the shoulder, aches by end of day at computer Relieving factors: ice  PRECAUTIONS:  None  RED FLAGS: None   WEIGHT BEARING RESTRICTIONS:  No  FALLS:  Has patient fallen in last 6 months? No  OCCUPATION:  [employer]  PLOF:  Independent  PATIENT GOALS:  Avoid surgery, decr pain   OBJECTIVE:  Note: Objective measures were completed at Evaluation unless otherwise noted.  DIAGNOSTIC FINDINGS:  MRI 01/23/24: IMPRESSION: 1. Findings suggestive of a  nondisplaced tear at the base of the posteroinferior labrum. 2. No rotator cuff tear identified. MRI 03/24/24: IMPRESSION: 1. Cervical spondylosis as outlined within the body of the report. 2. At C5-C6, there is moderate disc degeneration. A disc protrusion contributes to mild-to-moderate spinal canal stenosis to the right (with mild spinal cord flattening). Mild relative right neural foraminal narrowing also present at this level. 3. At C6-C7, there is moderate disc degeneration. A disc protrusion results in mild-to-moderate spinal canal stenosis (greater to the right) with mild spinal cord flattening. 4. No significant spinal canal or foraminal stenosis at the remaining cervical levels. 5. Multilevel facet arthropathy, greatest on the right at C4-C5 (moderate) and on the right at C5-C6 (mild-to-moderate). 6. Nonspecific reversal of the expected cervical lordosis. 7. Levocurvature of the cervical spine. 8. Grade 1 anterolisthesis at C3-C4 and C4-C5.  PATIENT SURVEYS:  Madison [name] 22.73 8/7: Quick Dash 18.18 9/25: Quick Dash    POSTURE:  Thoracic dextro/lumbar levoscoliosis with Rt shoulder elevation and scapular winging  HAND DOMINANCE:  Right    Body Part #1 Shoulder   UPPER EXTREMITY MMT: EVAL: Able to demo strength hold against resistance with poor scapular control.   MMT lb Right 9/25  Left 9/25  Shoulder flexion 8.5 9.1  Shoulder extension    Shoulder abduction 10.9 12.2  Shoulder adduction    Shoulder internal rotation 10.9 11.7  Shoulder external rotation 12.2 8.7  Elbow flexion    Elbow extension    (Blank rows = not tested)   9/2 cervical sidebend Rt 28 p! / Lt 34, rotation Rt 42 p!/ Lt 48 9/25: cervical sidebend Lt = pull on right upper trap; sidebend Rt = pull on left but pinch on Rt  TREATMENT DATE:  Treatment                             10/16: Blank lines following charge title = not provided on this treatment date.   Manual:  TPDN No Suboccipital STM Lt Lt paraspinals C0-4 SMT Rt C5,6 anchored pull from Rt to Lt rotation' Rt levator, upper trap STM There-ex:  There-Act:  Self Care:  Nuro-Re-ed: Supine houlder flexion- PT anchoring lower cervical rotation & lengthening upper trap to avoid use Prone with arm rests lowered- reach + flexion for lower trap engagement; +opp hip ext Practiced off edge of bed for HEP Gait Training:    Treatment                            10/9: Blank lines following charge title = not provided on this treatment date.   Manual:  TPDN No Rt levator scap & upper trap Supine Lt cervical paraspinal STM Suboccipital release and gentle manual traction There-ex:  There-Act:  Self Care: Edu on use of self traction Nuro-Re-ed: Prone retraction, +W, +cervical rotation   Primal push up Gait Training:    Treatment                            10/6: Blank lines following charge title = not provided on this treatment date.   Manual:  TPDN YES Trigger Point Dry Needling  Subsequent Treatment: Instructions provided previously at initial dry needling treatment.   Patient Verbal Consent Given: Yes Education Handout Provided: Previously Provided Muscles Treated: Rt upper trap, Rt C4-5 paraspinals Electrical Stimulation Performed: No Treatment Response/Outcome: twitch with decreased concordant tightness Prone rib mobilization Seated cervical rotation with self mob of cervical spine to reduce sidebend There-ex:  There-Act:  Self Care:  Nuro-Re-ed: Prone cervical retraction, + UE W, + cervical rotation- assisted by PT Gait Training:    Treatment                            10/3: Blank lines following charge title = not provided on this treatment date.   Manual:  TPDN YES Trigger Point Dry Needling  Subsequent Treatment: Instructions provided previously at initial dry  needling treatment.   Patient Verbal Consent Given: Yes Education Handout Provided: Previously Provided Muscles Treated: bilateral upper trap & levator scap, Rt C3-5 paraspinals Electrical Stimulation Performed: No Treatment Response/Outcome: twitch with decreased concordant spasm, decreased distal sensory changes Prone rib mobilizations Supine cervical traction Suboccipital release.       PATIENT EDUCATION:  Education details: Teacher, music of condition, POC, HEP, exercise form/rationale Person educated: Patient Education method: Explanation, Demonstration, Tactile cues, Verbal cues, and Handouts Education comprehension: verbalized understanding, returned demonstration, verbal cues required, tactile cues required, and needs further education  HOME EXERCISE PROGRAM: Access Code: [email or web address] URL: [email or web address] Date: 02/15/2024 Prepared by: Harlene Cordon  Rt hand holding mid cervical vertebrae, head turning right Prone cervical retraction, adding small head turns   ASSESSMENT:  CLINICAL IMPRESSION: Motions for retraining of proper musculature use in overhead reach. Tendency to overuse upper trap & levator, motions to encourage lower trap use.   REHAB POTENTIAL: Good  CLINICAL DECISION MAKING: Stable/uncomplicated  EVALUATION COMPLEXITY: Low   GOALS: Goals reviewed with patient? Yes  SHORT TERM GOALS: Target date: 5/24  Able to demo scapular control with  UE MMT Baseline: Goal status: met  2.  Verbalize ability to correct posture throughout the day, particularly at work with her right arm reaching forward to a mouse/keyboard Baseline:  Goal status: met  3.  Begin incorporating UE strength back into regular gym workout Baseline:  Goal status: partially met- low level for continued neuro re-ed    LONG TERM GOALS: Target date: POC date  Able to strengthen upper body during regular workouts with minimal to no discomfort in shoulder Baseline:   Goal status: INITIAL  2.  Quick Hollis to improve by MDC Baseline:  Goal status: INITIAL  3.  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  Able to maintain minimal to no pain throughout regular daily activities Baseline:  Goal status: INITIAL    PLAN:  PT FREQUENCY: 1-2x/week  PT DURATION: POC date  PLANNED INTERVENTIONS: 97164- PT Re-evaluation, 97750- Physical Performance Testing, 97110-Therapeutic exercises, 97530- Therapeutic activity, [id number]- Neuromuscular re-education, 97535- Self Care, [id number]- Manual therapy, [phone]- Aquatic Therapy, Patient/Family education, Taping, Dry Needling, Joint mobilization, Spinal mobilization, Scar mobilization, Cryotherapy, and Moist heat.  PLAN FOR NEXT SESSION: STM/DN PRN, continue length and activation along the Rt to Lt post diagonal chain Trial of home traction unit   Marquetta Weiskopf C. Karrina Lye PT, DPT 08/07/24 12:53 PM

## 2024-08-08 ENCOUNTER — Encounter: Payer: Self-pay | Admitting: Family Medicine

## 2024-08-08 ENCOUNTER — Ambulatory Visit: Admitting: Family Medicine

## 2024-08-08 VITALS — BP 104/66 | HR 87 | Ht 63.0 in | Wt 143.0 lb

## 2024-08-08 DIAGNOSIS — M9903 Segmental and somatic dysfunction of lumbar region: Secondary | ICD-10-CM

## 2024-08-08 DIAGNOSIS — M9901 Segmental and somatic dysfunction of cervical region: Secondary | ICD-10-CM

## 2024-08-08 DIAGNOSIS — M25511 Pain in right shoulder: Secondary | ICD-10-CM

## 2024-08-08 DIAGNOSIS — M9908 Segmental and somatic dysfunction of rib cage: Secondary | ICD-10-CM

## 2024-08-08 DIAGNOSIS — M9902 Segmental and somatic dysfunction of thoracic region: Secondary | ICD-10-CM | POA: Diagnosis not present

## 2024-08-08 DIAGNOSIS — M5030 Other cervical disc degeneration, unspecified cervical region: Secondary | ICD-10-CM | POA: Diagnosis not present

## 2024-08-08 DIAGNOSIS — M9904 Segmental and somatic dysfunction of sacral region: Secondary | ICD-10-CM

## 2024-08-08 NOTE — Assessment & Plan Note (Signed)
 Known arthritic changes, muscle energy accomplished afterwards.  Likely also contributing to some of the shoulder tightness noted on the right side.  No change in medications but still having some difficulty with sleeping so if have to change Effexor  would go to the twice a day dosing if necessary.

## 2024-08-08 NOTE — Assessment & Plan Note (Signed)
 Discussed with patient about icing regimen and home exercises, discussed which activities to do and which ones to avoid.  Increase activity slowly.  Patient is doing much better overall.  Did discuss cycling position that I think will be beneficial.  Responded well to manipulation as well.  Follow-up again in 6 to 8 weeks

## 2024-08-08 NOTE — Patient Instructions (Signed)
 Trigger point injections Good to see you! See you again in 2 months

## 2024-08-11 ENCOUNTER — Encounter (HOSPITAL_BASED_OUTPATIENT_CLINIC_OR_DEPARTMENT_OTHER): Payer: Self-pay | Admitting: Physical Therapy

## 2024-08-11 ENCOUNTER — Ambulatory Visit (HOSPITAL_BASED_OUTPATIENT_CLINIC_OR_DEPARTMENT_OTHER): Admitting: Physical Therapy

## 2024-08-11 DIAGNOSIS — G8929 Other chronic pain: Secondary | ICD-10-CM

## 2024-08-11 DIAGNOSIS — M25511 Pain in right shoulder: Secondary | ICD-10-CM | POA: Diagnosis not present

## 2024-08-11 DIAGNOSIS — R293 Abnormal posture: Secondary | ICD-10-CM

## 2024-08-11 NOTE — Therapy (Signed)
 OUTPATIENT PHYSICAL THERAPY TREATMENT   Patient Name: Madison [name] MRN: [id number] DOB:03/24/1986, 38 y.o., female Today's Date: 08/12/2024  END OF SESSION:  PT End of Session - 08/11/24 1442     Visit Number 25    Number of Visits 25    Date for Recertification  08/23/24    Authorization Type [facility] [employer]    PT Start Time 1442    PT Stop Time 1530    PT Time Calculation (min) 48 min    Activity Tolerance Patient tolerated treatment well    Behavior During Therapy [facility] for tasks assessed/performed                    Past Medical History:  Diagnosis Date   Allergy    Asthma    Hx of migraines 01/012003   can tell when they come on, treats with excedrin migraine.   Scoliosis 10/24/1995   Vitamin D  deficiency    Past Surgical History:  Procedure Laterality Date   partial spinal fusion C7-T2 due to scoliosis 03/1998 N/A 04/11/1998   TONSILLECTOMY     WISDOM TOOTH EXTRACTION     Patient Active Problem List   Diagnosis Date Noted   DDD (degenerative disc disease), cervical 05/05/2024   Fatigue 10/05/2023   Right shoulder pain 04/24/2023   Hyperlipidemia 02/25/2023   Arthralgia of right acromioclavicular joint 11/28/2022   Puncture wound of skin from metal nail 07/27/2022   B12 deficiency 02/08/2022   Vitamin D  deficiency 02/08/2022   Allergy, unspecified, initial encounter 01/25/2022   Laceration without foreign body of other finger without damage to nail, initial encounter 01/25/2022   Carpal tunnel syndrome on right 11/07/2021   Dorsalgia, unspecified 08/21/2021   Migraine, unspecified, not intractable, without status migrainosus 08/21/2021   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   Chronic urticaria 05/11/2021   Other adverse food reactions, not elsewhere classified, subsequent encounter 05/11/2021   Seasonal and perennial allergic rhinitis 05/11/2021   Mild intermittent asthma without complication 02/02/2021   Trigger point of right  shoulder region 04/07/2020   Nonallopathic lesion of lumbosacral region 11/07/2018   Nonallopathic lesion of sacral region 11/07/2018   Nonallopathic lesion of thoracic region 11/07/2018   Volar plate injury of finger 01/22/2018   Scoliosis 08/21/2017    REFERRING PROVIDER: Bonner Hair, MD  REFERRING DIAG: Rt shoulder posterior labrum tear  Rationale for Evaluation and Treatment: Rehabilitation  THERAPY DIAG:  Chronic right shoulder pain  Abnormal posture  ONSET DATE: chronic back pain with subacute on chronic shoulder pain (March 2024)   SUBJECTIVE:  SUBJECTIVE STATEMENT:  I have been in excruciating pain through right periscap region & shoulder, heaviness down arm without N/T into hand. Had to stop on ride to stretch which does not decrease pain.    Cervical epidural on 03/31/24 Rt C6-C7. Repeat injection on 7/18.    EVAL: Have been seeing Dr claudene for about 6 years due to scoliosis. Neck pain without N/T. Rt-handed.   PERTINENT HISTORY:  Scoliosis with fusions that began at 38 yo   PAIN:  Are you having pain? Yes: NPRS scale: 2 Pain location: Rt shoulder into neck Pain description: ache Aggravating factors: dressing/undressing- rotation motion, laying on the shoulder, aches by end of day at computer Relieving factors: ice  PRECAUTIONS:  None  RED FLAGS: None   WEIGHT BEARING RESTRICTIONS:  No  FALLS:  Has patient fallen in last 6 months? No  OCCUPATION:  [employer]  PLOF:  Independent  PATIENT GOALS:  Avoid surgery, decr pain   OBJECTIVE:  Note: Objective measures were completed at Evaluation unless otherwise noted.  DIAGNOSTIC FINDINGS:  MRI 01/23/24: IMPRESSION: 1. Findings suggestive of a nondisplaced tear at the base of the posteroinferior  labrum. 2. No rotator cuff tear identified. MRI 03/24/24: IMPRESSION: 1. Cervical spondylosis as outlined within the body of the report. 2. At C5-C6, there is moderate disc degeneration. A disc protrusion contributes to mild-to-moderate spinal canal stenosis to the right (with mild spinal cord flattening). Mild relative right neural foraminal narrowing also present at this level. 3. At C6-C7, there is moderate disc degeneration. A disc protrusion results in mild-to-moderate spinal canal stenosis (greater to the right) with mild spinal cord flattening. 4. No significant spinal canal or foraminal stenosis at the remaining cervical levels. 5. Multilevel facet arthropathy, greatest on the right at C4-C5 (moderate) and on the right at C5-C6 (mild-to-moderate). 6. Nonspecific reversal of the expected cervical lordosis. 7. Levocurvature of the cervical spine. 8. Grade 1 anterolisthesis at C3-C4 and C4-C5.  PATIENT SURVEYS:  [name] 22.73 8/7: Quick Dash 18.18 9/25: Quick Dash    POSTURE:  Thoracic dextro/lumbar levoscoliosis with Rt shoulder elevation and scapular winging  HAND DOMINANCE:  Right    Body Part #1 Shoulder   UPPER EXTREMITY MMT: EVAL: Able to demo strength hold against resistance with poor scapular control.   MMT lb Right 9/25  Left 9/25  Shoulder flexion 8.5 9.1  Shoulder extension    Shoulder abduction 10.9 12.2  Shoulder adduction    Shoulder internal rotation 10.9 11.7  Shoulder external rotation 12.2 8.7  Elbow flexion    Elbow extension    (Blank rows = not tested)   9/2 cervical sidebend Rt 28 p! / Lt 34, rotation Rt 42 p!/ Lt 48 9/25: cervical sidebend Lt = pull on right upper trap; sidebend Rt = pull on left but pinch on Rt                                                                                                             TREATMENT DATE:  Treatment                            10/21: Blank lines following charge title = not provided on  this treatment date.   Manual:  TPDN No Prone rib mobs IASTM periscapular region Subscap STM There-ex:  There-Act:  Self Care:  Nuro-Re-ed:  Gait Training:    Treatment                            10/16: Blank lines following charge title = not provided on this treatment date.   Manual:  TPDN No Suboccipital STM Lt Lt paraspinals C0-4 SMT Rt C5,6 anchored pull from Rt to Lt rotation' Rt levator, upper trap STM There-ex:  There-Act:  Self Care:  Nuro-Re-ed: Supine houlder flexion- PT anchoring lower cervical rotation & lengthening upper trap to avoid use Prone with arm rests lowered- reach + flexion for lower trap engagement; +opp hip ext Practiced off edge of bed for HEP Gait Training:    Treatment                            10/9: Blank lines following charge title = not provided on this treatment date.   Manual:  TPDN No Rt levator scap & upper trap Supine Lt cervical paraspinal STM Suboccipital release and gentle manual traction There-ex:  There-Act:  Self Care: Edu on use of self traction Nuro-Re-ed: Prone retraction, +W, +cervical rotation   Primal push up Gait Training:    PATIENT EDUCATION:  Education details: Anatomy of condition, POC, HEP, exercise form/rationale Person educated: Patient Education method: Explanation, Demonstration, Tactile cues, Verbal cues, and Handouts Education comprehension: verbalized understanding, returned demonstration, verbal cues required, tactile cues required, and needs further education  HOME EXERCISE PROGRAM: Access Code: [email or web address] URL: [email or web address] Date: 02/15/2024 Prepared by: Harlene Cordon  Rt hand holding mid cervical vertebrae, head turning right Prone cervical retraction, adding small head turns   ASSESSMENT:  CLINICAL IMPRESSION: Time taken today to discuss sudden onset of severe symptoms, modifications to posture on bike and possibility of sprain at costovertebral  joint. Pt has done a lot of work to adjust posture and I requested updated xray from MD as the level above the fusion was so tender today but levels above and below were not. Pt continues to have difficulty sleeping- will continue to brainstorm options to trial. Encouraged pt to continue bike riding and be aware of symptoms as she goes over bumps and with head turns- pt did message after stating that the bumps in the road were bothersome toward the end of the ride but is having a hard time taking a deep breath- deep breathing was also uncomfortable in session at Rt T3 costovertebral joint.   REHAB POTENTIAL: Good  CLINICAL DECISION MAKING: Stable/uncomplicated  EVALUATION COMPLEXITY: Low   GOALS: Goals reviewed with patient? Yes  SHORT TERM GOALS: Target date: 5/24  Able to demo scapular control with UE MMT Baseline: Goal status: met  2.  Verbalize ability to correct posture throughout the day, particularly at work with her right arm reaching forward to a mouse/keyboard Baseline:  Goal status: met  3.  Begin incorporating UE strength back into regular gym workout Baseline:  Goal status: partially met- low level for continued neuro re-ed    LONG TERM GOALS: Target date: POC date  Able to strengthen upper body  during regular workouts with minimal to no discomfort in shoulder Baseline:  Goal status: INITIAL  2.  Quick Hollis to improve by MDC Baseline:  Goal status: INITIAL  3.  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  Able to maintain minimal to no pain throughout regular daily activities Baseline:  Goal status: INITIAL    PLAN:  PT FREQUENCY: 1-2x/week  PT DURATION: POC date  PLANNED INTERVENTIONS: 97164- PT Re-evaluation, 97750- Physical Performance Testing, 97110-Therapeutic exercises, 97530- Therapeutic activity, [id number]-  Neuromuscular re-education, 97535- Self Care, 02859- Manual therapy, [phone]- Aquatic Therapy, Patient/Family education, Taping, Dry Needling, Joint mobilization, Spinal mobilization, Scar mobilization, Cryotherapy, and Moist heat.  PLAN FOR NEXT SESSION: STM/DN PRN, continue length and activation along the Rt to Lt post diagonal chain Trial of home traction unit   Lilburn Straw C. Akshita Italiano PT, DPT 08/12/24 4:07 PM

## 2024-08-13 ENCOUNTER — Other Ambulatory Visit: Payer: Self-pay

## 2024-08-13 ENCOUNTER — Telehealth: Payer: Self-pay | Admitting: Family Medicine

## 2024-08-13 DIAGNOSIS — M546 Pain in thoracic spine: Secondary | ICD-10-CM

## 2024-08-13 DIAGNOSIS — F411 Generalized anxiety disorder: Secondary | ICD-10-CM | POA: Diagnosis not present

## 2024-08-13 NOTE — Telephone Encounter (Signed)
 Patient called stating that her physical therapist reached out to Dr Claudene wanting an updated xray of her thoracic spine. Can this be ordered for her?  Patient would like a call when order is placed.

## 2024-08-14 ENCOUNTER — Ambulatory Visit

## 2024-08-14 DIAGNOSIS — M546 Pain in thoracic spine: Secondary | ICD-10-CM | POA: Diagnosis not present

## 2024-08-14 DIAGNOSIS — M4185 Other forms of scoliosis, thoracolumbar region: Secondary | ICD-10-CM | POA: Diagnosis not present

## 2024-08-17 ENCOUNTER — Ambulatory Visit

## 2024-08-17 ENCOUNTER — Other Ambulatory Visit: Payer: Self-pay

## 2024-08-17 ENCOUNTER — Ambulatory Visit
Admission: RE | Admit: 2024-08-17 | Discharge: 2024-08-17 | Disposition: A | Discharge status: Home / Self Care | Source: Ambulatory Visit | Attending: Family Medicine | Admitting: Family Medicine

## 2024-08-17 VITALS — BP 131/82 | HR 79 | Temp 98.8°F | Resp 16

## 2024-08-17 DIAGNOSIS — M7989 Other specified soft tissue disorders: Secondary | ICD-10-CM | POA: Diagnosis not present

## 2024-08-17 DIAGNOSIS — M25572 Pain in left ankle and joints of left foot: Secondary | ICD-10-CM | POA: Diagnosis not present

## 2024-08-17 DIAGNOSIS — M19072 Primary osteoarthritis, left ankle and foot: Secondary | ICD-10-CM | POA: Diagnosis not present

## 2024-08-17 DIAGNOSIS — M7742 Metatarsalgia, left foot: Secondary | ICD-10-CM | POA: Diagnosis not present

## 2024-08-17 DIAGNOSIS — M79672 Pain in left foot: Secondary | ICD-10-CM

## 2024-08-17 NOTE — Discharge Instructions (Signed)
 May use ice and elevation to reduce swelling May take ibuprofen  600 mg to 800 mg with food, up to 3 times a day Limit walking and weightbearing while foot is painful Wear boot until you can walk comfortably without it See your sports medicine doctor if not improving

## 2024-08-17 NOTE — ED Triage Notes (Addendum)
 Has c/o left lateral foot swelling and pain with activity and at rest since Friday afternoon. Pain has been increasing along with swelling and redness. No hx gout. No initial injury. Has had ibuprofen , biofreeze, penetrex cream.

## 2024-08-17 NOTE — ED Provider Notes (Signed)
 [facility]    CSN: [id number] Arrival date & time: 08/17/24  0920      History   Chief Complaint Chief Complaint  Patient presents with   Foot Injury    Lateral left foot swollen and painful with activity and at rest. - Entered by patient    HPI Madison [name] is a 38 y.o. female.   Patient states that she has scoliosis of the spine and degenerative disc disease in her neck.  Usually does not have problems with her lower extremities.  Is here for foot pain.  Has pain in the lateral left foot.  She states the pain is severe enough to keep her awake at night.  She has had no accident or injury.  She is physically active.  No change in shoewear.  Denies trauma    Past Medical History:  Diagnosis Date   Allergy    Asthma    Hx of migraines 01/012003   can tell when they come on, treats with excedrin migraine.   Scoliosis 10/24/1995   Vitamin D  deficiency     Patient Active Problem List   Diagnosis Date Noted   DDD (degenerative disc disease), cervical 05/05/2024   Fatigue 10/05/2023   Right shoulder pain 04/24/2023   Hyperlipidemia 02/25/2023   Arthralgia of right acromioclavicular joint 11/28/2022   Puncture wound of skin from metal nail 07/27/2022   B12 deficiency 02/08/2022   Vitamin D  deficiency 02/08/2022   Allergy, unspecified, initial encounter 01/25/2022   Laceration without foreign body of other finger without damage to nail, initial encounter 01/25/2022   Carpal tunnel syndrome on right 11/07/2021   Dorsalgia, unspecified 08/21/2021   Migraine, unspecified, not intractable, without status migrainosus 08/21/2021   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   Chronic urticaria 05/11/2021   Other adverse food reactions, not elsewhere classified, subsequent encounter 05/11/2021   Seasonal and perennial allergic rhinitis 05/11/2021   Mild intermittent asthma without complication 02/02/2021   Trigger point of right shoulder region  04/07/2020   Nonallopathic lesion of lumbosacral region 11/07/2018   Nonallopathic lesion of sacral region 11/07/2018   Nonallopathic lesion of thoracic region 11/07/2018   Volar plate injury of finger 01/22/2018   Scoliosis 08/21/2017    Past Surgical History:  Procedure Laterality Date   partial spinal fusion C7-T2 due to scoliosis 03/1998 N/A 04/11/1998   TONSILLECTOMY     WISDOM TOOTH EXTRACTION      OB History     Gravida  0   Para  0   Term  0   Preterm  0   AB  0   Living  0      SAB  0   IAB  0   Ectopic  0   Multiple  0   Live Births               Home Medications    Prior to Admission medications   Medication Sig Start Date End Date Taking? Authorizing Provider  busPIRone  (BUSPAR ) 5 MG tablet Take 1 tablet (5 mg total) by mouth 3 (three) times daily as needed. 03/28/24   Smith, Zachary M, DO  Cyanocobalamin  (VITAMIN B-12 CR PO) daily.    [provider]  fexofenadine (ALLEGRA) 30 MG tablet Take 30 mg by mouth daily.     [provider]  methocarbamol  (ROBAXIN ) 500 MG tablet Take 1 tablet (500 mg total) by mouth 2 (two) times daily as needed for muscle spasms. 05/05/24  Claudene Arthea HERO, DO  Spacer/Aero-Holding Chambers (AEROCHAMBER PLUS WITH MASK) inhaler Use as directed. 12/07/18   Leath-Warren, Etta PARAS, NP  venlafaxine  XR (EFFEXOR  XR) 75 MG 24 hr capsule Take 1 capsule (75 mg total) by mouth daily with breakfast. 06/02/24   Claudene Arthea HERO, DO  Vitamin D , Ergocalciferol , (DRISDOL ) 1.25 MG (50000 UNIT) CAPS capsule Take 1 capsule (50,000 Units total) by mouth every 7 (seven) days. 07/11/24   Claudene Arthea HERO, DO    Family History Family History  Problem Relation Age of Onset   Healthy Mother    Other Father        prostate level elevation   Hearing loss Father    Prostate cancer Father    Diabetes Brother        type 2   Diabetes Mellitus II Brother        TWIN   Colon cancer Paternal Aunt    Breast cancer Maternal  Grandmother 93   Osteoporosis Maternal Grandmother    Heart failure Maternal Grandmother    COPD Maternal Grandmother    Prostate cancer Maternal Grandfather        w mets   Heart disease Maternal Grandfather    Heart failure Maternal Grandfather    Brain cancer Paternal Grandmother    Melanoma Paternal Grandfather     Social History Social History   Tobacco Use   Smoking status: Never    Passive exposure: Never   Smokeless tobacco: Never  Vaping Use   Vaping status: Never Used  Substance Use Topics   Alcohol use: Never    Comment: occasionally   Drug use: No     Allergies   Kiwi extract, Prednisone , and Meloxicam    Review of Systems Review of Systems  See HPI Physical Exam Triage Vital Signs ED Triage Vitals  Encounter Vitals Group     BP 08/17/24 0923 131/82     Girls Systolic BP Percentile --      Girls Diastolic BP Percentile --      Boys Systolic BP Percentile --      Boys Diastolic BP Percentile --      Pulse Rate 08/17/24 0923 79     Resp 08/17/24 0923 16     Temp 08/17/24 0923 98.8 F (37.1 C)     Temp src --      SpO2 08/17/24 0923 97 %     Weight --      Height --      Head Circumference --      Peak Flow --      Pain Score 08/17/24 0930 7     Pain Loc --      Pain Education --      Exclude from Growth Chart --    No data found.  Updated Vital Signs BP 131/82   Pulse 79   Temp 98.8 F (37.1 C)   Resp 16   SpO2 97%      Physical Exam Constitutional:      General: She is not in acute distress.    Appearance: She is well-developed.  HENT:     Head: Normocephalic and atraumatic.  Eyes:     Conjunctiva/sclera: Conjunctivae normal.     Pupils: Pupils are equal, round, and reactive to light.  Cardiovascular:     Rate and Rhythm: Normal rate.  Pulmonary:     Effort: Pulmonary effort is normal. No respiratory distress.  Musculoskeletal:        General: Normal  range of motion.     Cervical back: Normal range of motion.        Feet:  Skin:    General: Skin is warm and dry.  Neurological:     Mental Status: She is alert.      UC Treatments / Results  Labs (all labs ordered are listed, but only abnormal results are displayed) Labs Reviewed - No data to display  EKG   Radiology DG Foot Complete Left Result Date: 08/17/2024 CLINICAL DATA:  Lateral foot pain, swelling and erythema for 2 days. Pain at proximal 5th metatarsal. No acute injury. EXAM: LEFT FOOT - COMPLETE 3+ VIEW COMPARISON:  None Available. FINDINGS: The mineralization and alignment are normal. There is no evidence of acute fracture or dislocation. Minimal degenerative changes at the 1st metatarsophalangeal joint. The additional joint spaces are preserved. Small os peroneum noted adjacent to the cuboid. No foreign body, soft tissue emphysema or other soft tissue abnormality identified. IMPRESSION: No acute osseous findings or explanation for the patient's symptoms. Minimal degenerative changes at the 1st MTP joint. Electronically Signed   By: Elsie Perone M.D.   On: 08/17/2024 10:22    Procedures Procedures (including critical care time)  Medications Ordered in UC Medications - No data to display  Initial Impression / Assessment and Plan / UC Course  I have reviewed the triage vital signs and the nursing notes.  Pertinent labs & imaging results that were available during my care of the patient were reviewed by me and considered in my medical decision making (see chart for details).     X-rays appear normal.  Patient has tenderness and inflammation of her fifth metatarsal.  Told her this is likely an overuse type of injury.  Recommend rest, ice, anti-inflammatories, and postop shoe. Final Clinical Impressions(s) / UC Diagnoses   Final diagnoses:  Acute foot pain, left  Metatarsalgia of left foot     Discharge Instructions      May use ice and elevation to reduce swelling May take ibuprofen  600 mg to 800 mg with food, up to 3  times a day Limit walking and weightbearing while foot is painful Wear boot until you can walk comfortably without it See your sports medicine doctor if not improving     ED Prescriptions   None    PDMP not reviewed this encounter.   Maranda Jamee Jacob, MD 08/17/24 [phone]

## 2024-08-18 ENCOUNTER — Ambulatory Visit: Payer: Self-pay | Admitting: Family Medicine

## 2024-08-18 ENCOUNTER — Encounter (HOSPITAL_BASED_OUTPATIENT_CLINIC_OR_DEPARTMENT_OTHER): Payer: Self-pay | Admitting: Physical Therapy

## 2024-08-18 ENCOUNTER — Ambulatory Visit (HOSPITAL_BASED_OUTPATIENT_CLINIC_OR_DEPARTMENT_OTHER): Admitting: Physical Therapy

## 2024-08-18 DIAGNOSIS — M25511 Pain in right shoulder: Secondary | ICD-10-CM | POA: Diagnosis not present

## 2024-08-18 DIAGNOSIS — R293 Abnormal posture: Secondary | ICD-10-CM

## 2024-08-18 DIAGNOSIS — G8929 Other chronic pain: Secondary | ICD-10-CM

## 2024-08-18 NOTE — Therapy (Signed)
 OUTPATIENT PHYSICAL THERAPY TREATMENT   Patient Name: Madison [name] MRN: [id number] DOB:[birth year], 38 y.o., female Today's Date: 08/18/2024  END OF SESSION:  PT End of Session - 08/18/24 1451     Visit Number 26    Date for Recertification  10/22/24    Authorization Type [facility] [employer]    PT Start Time 1448    PT Stop Time 1530    PT Time Calculation (min) 42 min    Activity Tolerance Patient tolerated treatment well    Behavior During Therapy [facility] for tasks assessed/performed                    Past Medical History:  Diagnosis Date   Allergy    Asthma    Hx of migraines 01/012003   can tell when they come on, treats with excedrin migraine.   Scoliosis 10/24/1995   Vitamin D  deficiency    Past Surgical History:  Procedure Laterality Date   partial spinal fusion C7-T2 due to scoliosis 03/1998 N/A 04/11/1998   TONSILLECTOMY     WISDOM TOOTH EXTRACTION     Patient Active Problem List   Diagnosis Date Noted   DDD (degenerative disc disease), cervical 05/05/2024   Fatigue 10/05/2023   Right shoulder pain 04/24/2023   Hyperlipidemia 02/25/2023   Arthralgia of right acromioclavicular joint 11/28/2022   Puncture wound of skin from metal nail 07/27/2022   B12 deficiency 02/08/2022   Vitamin D  deficiency 02/08/2022   Allergy, unspecified, initial encounter 01/25/2022   Laceration without foreign body of other finger without damage to nail, initial encounter 01/25/2022   Carpal tunnel syndrome on right 11/07/2021   Dorsalgia, unspecified 08/21/2021   Migraine, unspecified, not intractable, without status migrainosus 08/21/2021   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   Chronic urticaria 05/11/2021   Other adverse food reactions, not elsewhere classified, subsequent encounter 05/11/2021   Seasonal and perennial allergic rhinitis 05/11/2021   Mild intermittent asthma without complication 02/02/2021   Trigger point of right shoulder region 04/07/2020    Nonallopathic lesion of lumbosacral region 11/07/2018   Nonallopathic lesion of sacral region 11/07/2018   Nonallopathic lesion of thoracic region 11/07/2018   Volar plate injury of finger 01/22/2018   Scoliosis 08/21/2017    REFERRING PROVIDER: Bonner Hair, MD  REFERRING DIAG: Rt shoulder posterior labrum tear  Rationale for Evaluation and Treatment: Rehabilitation  THERAPY DIAG:  Chronic right shoulder pain  Abnormal posture  ONSET DATE: chronic back pain with subacute on chronic shoulder pain (March 2024)   SUBJECTIVE:  SUBJECTIVE STATEMENT:  Lateral ankle pain and in shoe. Not fractured.     Cervical epidural on 03/31/24 Rt C6-C7. Repeat injection on 7/18.    EVAL: Have been seeing Dr claudene for about 6 years due to scoliosis. Neck pain without N/T. Rt-handed.   PERTINENT HISTORY:  Scoliosis with fusions that began at 38 yo   PAIN:  Are you having pain? Yes: NPRS scale: 2 Pain location: Rt shoulder into neck Pain description: ache Aggravating factors: dressing/undressing- rotation motion, laying on the shoulder, aches by end of day at computer Relieving factors: ice  PRECAUTIONS:  None  RED FLAGS: None   WEIGHT BEARING RESTRICTIONS:  No  FALLS:  Has patient fallen in last 6 months? No  OCCUPATION:  [employer]  PLOF:  Independent  PATIENT GOALS:  Avoid surgery, decr pain   OBJECTIVE:  Note: Objective measures were completed at Evaluation unless otherwise noted.  DIAGNOSTIC FINDINGS:  MRI 01/23/24: IMPRESSION: 1. Findings suggestive of a nondisplaced tear at the base of the posteroinferior labrum. 2. No rotator cuff tear identified. MRI 03/24/24: IMPRESSION: 1. Cervical spondylosis as outlined within the body of the report. 2. At C5-C6, there is  moderate disc degeneration. A disc protrusion contributes to mild-to-moderate spinal canal stenosis to the right (with mild spinal cord flattening). Mild relative right neural foraminal narrowing also present at this level. 3. At C6-C7, there is moderate disc degeneration. A disc protrusion results in mild-to-moderate spinal canal stenosis (greater to the right) with mild spinal cord flattening. 4. No significant spinal canal or foraminal stenosis at the remaining cervical levels. 5. Multilevel facet arthropathy, greatest on the right at C4-C5 (moderate) and on the right at C5-C6 (mild-to-moderate). 6. Nonspecific reversal of the expected cervical lordosis. 7. Levocurvature of the cervical spine. 8. Grade 1 anterolisthesis at C3-C4 and C4-C5.  PATIENT SURVEYS:  Madison [name] 22.73 8/7: Quick Dash 18.18 9/25: Quick Dash    POSTURE:  Thoracic dextro/lumbar levoscoliosis with Rt shoulder elevation and scapular winging  HAND DOMINANCE:  Right    Body Part #1 Shoulder   UPPER EXTREMITY MMT: EVAL: Able to demo strength hold against resistance with poor scapular control.   MMT lb Right 9/25  Left 9/25  Shoulder flexion 8.5 9.1  Shoulder extension    Shoulder abduction 10.9 12.2  Shoulder adduction    Shoulder internal rotation 10.9 11.7  Shoulder external rotation 12.2 8.7  Elbow flexion    Elbow extension    (Blank rows = not tested)   9/2 cervical sidebend Rt 28 p! / Lt 34, rotation Rt 42 p!/ Lt 48 9/25: cervical sidebend Lt = pull on right upper trap; sidebend Rt = pull on left but pinch on Rt                                                                                                             TREATMENT DATE:  Treatment  10/27: Blank lines following charge title = not provided on this treatment date.   Manual:  TPDN YES Trigger Point Dry Needling  Subsequent Treatment: Instructions provided previously at initial dry needling  treatment.   Patient Verbal Consent Given: Yes Education Handout Provided: Previously Provided Muscles Treated: Lt peroneals and gastroc Electrical Stimulation Performed: No Treatment Response/Outcome: decreased concordant pain Ice massage Roller education  Median, ulnar and radial nerve glides, passive in supine There-ex: Standing gastroc & soleus stretch There-Act: Bike upper body posture- mini push up + small cervical extension -> chin tuck & press up Discussed bike shoes & rationale for foot Self Care:  Nuro-Re-ed:  Gait Training:    Treatment                            10/21: Blank lines following charge title = not provided on this treatment date.   Manual:  TPDN No Prone rib mobs IASTM periscapular region Subscap STM There-ex:  There-Act:  Self Care:  Nuro-Re-ed:  Gait Training:   PATIENT EDUCATION:  Education details: Teacher, Music of condition, POC, HEP, exercise form/rationale Person educated: Patient Education method: Explanation, Demonstration, Tactile cues, Verbal cues, and Handouts Education comprehension: verbalized understanding, returned demonstration, verbal cues required, tactile cues required, and needs further education  HOME EXERCISE PROGRAM: Access Code: [email or web address] URL: [email or web address] Date: 02/15/2024 Prepared by: Harlene Cordon  Rt hand holding mid cervical vertebrae, head turning right Prone cervical retraction, adding small head turns   ASSESSMENT:  CLINICAL IMPRESSION: Able to reduce concordant pain in Lt lateral ankle and foot following TPDN to peroneals and lateral gastroc. Discussed need for stability through foot when biking as most pressure is already into anterior foot anyways. Neural tension noted in radial nerve today. Able to improve tolerable UE ROM with less tingling and pt reported odd sensations following that I expect to decrease.   REHAB POTENTIAL: Good  CLINICAL DECISION MAKING:  Stable/uncomplicated  EVALUATION COMPLEXITY: Low   GOALS: Goals reviewed with patient? Yes  SHORT TERM GOALS: Target date: 5/24  Able to demo scapular control with UE MMT Baseline: Goal status: met  2.  Verbalize ability to correct posture throughout the day, particularly at work with her right arm reaching forward to a mouse/keyboard Baseline:  Goal status: met  3.  Begin incorporating UE strength back into regular gym workout Baseline:  Goal status: partially met- low level for continued neuro re-ed    LONG TERM GOALS: Target date: POC date  Able to strengthen upper body during regular workouts with minimal to no discomfort in shoulder Baseline:  Goal status: INITIAL  2.  Quick [place] to improve by MDC Baseline:  Goal status: INITIAL  3.  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  Able to maintain minimal to no pain throughout regular daily activities Baseline:  Goal status: INITIAL    PLAN:  PT FREQUENCY: 1-2x/week  PT DURATION: POC date  PLANNED INTERVENTIONS: 97164- PT Re-evaluation, 97750- Physical Performance Testing, 97110-Therapeutic exercises, 97530- Therapeutic activity, [id number]- Neuromuscular re-education, 97535- Self Care, [id number]- Manual therapy, [phone]- Aquatic Therapy, Patient/Family education, Taping, Dry Needling, Joint mobilization, Spinal mobilization, Scar mobilization, Cryotherapy, and Moist heat.  PLAN FOR NEXT SESSION: STM/DN PRN, continue length and activation along the Rt to Lt post diagonal chain   Hamda Klutts C. Addley Ballinger PT, DPT  08/18/24 4:16 PM

## 2024-08-20 DIAGNOSIS — F411 Generalized anxiety disorder: Secondary | ICD-10-CM | POA: Diagnosis not present

## 2024-08-21 ENCOUNTER — Encounter (HOSPITAL_BASED_OUTPATIENT_CLINIC_OR_DEPARTMENT_OTHER): Payer: Self-pay | Admitting: Physical Therapy

## 2024-08-21 ENCOUNTER — Ambulatory Visit (HOSPITAL_BASED_OUTPATIENT_CLINIC_OR_DEPARTMENT_OTHER): Payer: Self-pay | Admitting: Physical Therapy

## 2024-08-21 ENCOUNTER — Encounter (HOSPITAL_BASED_OUTPATIENT_CLINIC_OR_DEPARTMENT_OTHER): Admitting: Physical Therapy

## 2024-08-21 DIAGNOSIS — M25511 Pain in right shoulder: Secondary | ICD-10-CM | POA: Diagnosis not present

## 2024-08-21 DIAGNOSIS — G8929 Other chronic pain: Secondary | ICD-10-CM

## 2024-08-21 DIAGNOSIS — R293 Abnormal posture: Secondary | ICD-10-CM

## 2024-08-21 NOTE — Therapy (Signed)
 OUTPATIENT PHYSICAL THERAPY TREATMENT   Patient Name: Madison [name] MRN: [id number] DOB:28-Jan-1986, 38 y.o., female Today's Date: 08/21/2024  END OF SESSION:  PT End of Session - 08/21/24 1105     Visit Number 27    Date for Recertification  10/22/24    Authorization Type [facility] [employer]    PT Start Time 1102    PT Stop Time 1141    PT Time Calculation (min) 39 min    Activity Tolerance Patient tolerated treatment well    Behavior During Therapy [facility] for tasks assessed/performed                     Past Medical History:  Diagnosis Date   Allergy    Asthma    Hx of migraines 01/012003   can tell when they come on, treats with excedrin migraine.   Scoliosis 10/24/1995   Vitamin D  deficiency    Past Surgical History:  Procedure Laterality Date   partial spinal fusion C7-T2 due to scoliosis 03/1998 N/A 04/11/1998   TONSILLECTOMY     WISDOM TOOTH EXTRACTION     Patient Active Problem List   Diagnosis Date Noted   DDD (degenerative disc disease), cervical 05/05/2024   Fatigue 10/05/2023   Right shoulder pain 04/24/2023   Hyperlipidemia 02/25/2023   Arthralgia of right acromioclavicular joint 11/28/2022   Puncture wound of skin from metal nail 07/27/2022   B12 deficiency 02/08/2022   Vitamin D  deficiency 02/08/2022   Allergy, unspecified, initial encounter 01/25/2022   Laceration without foreign body of other finger without damage to nail, initial encounter 01/25/2022   Carpal tunnel syndrome on right 11/07/2021   Dorsalgia, unspecified 08/21/2021   Migraine, unspecified, not intractable, without status migrainosus 08/21/2021   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   Chronic urticaria 05/11/2021   Other adverse food reactions, not elsewhere classified, subsequent encounter 05/11/2021   Seasonal and perennial allergic rhinitis 05/11/2021   Mild intermittent asthma without complication 02/02/2021   Trigger point of right shoulder region 04/07/2020    Nonallopathic lesion of lumbosacral region 11/07/2018   Nonallopathic lesion of sacral region 11/07/2018   Nonallopathic lesion of thoracic region 11/07/2018   Volar plate injury of finger 01/22/2018   Scoliosis 08/21/2017    REFERRING PROVIDER: Bonner Hair, MD  REFERRING DIAG: Rt shoulder posterior labrum tear  Rationale for Evaluation and Treatment: Rehabilitation  THERAPY DIAG:  Chronic right shoulder pain  Abnormal posture  ONSET DATE: chronic back pain with subacute on chronic shoulder pain (March 2024)   SUBJECTIVE:  SUBJECTIVE STATEMENT:  Foot is feeling good but my right hip is now starting to hurt. Bike fitting scheduled on the 12th.     Cervical epidural on 03/31/24 Rt C6-C7. Repeat injection on 7/18.    EVAL: Have been seeing Dr claudene for about 6 years due to scoliosis. Neck pain without N/T. Rt-handed.   PERTINENT HISTORY:  Scoliosis with fusions that began at 38 yo   PAIN:  Are you having pain? Yes: NPRS scale: 2 Pain location: Rt shoulder into neck Pain description: ache Aggravating factors: dressing/undressing- rotation motion, laying on the shoulder, aches by end of day at computer Relieving factors: ice  PRECAUTIONS:  None  RED FLAGS: None   WEIGHT BEARING RESTRICTIONS:  No  FALLS:  Has patient fallen in last 6 months? No  OCCUPATION:  [employer] for [employer]  PLOF:  Independent  PATIENT GOALS:  Avoid surgery, decr pain   OBJECTIVE:  Note: Objective measures were completed at Evaluation unless otherwise noted.  DIAGNOSTIC FINDINGS:  MRI 01/23/24: IMPRESSION: 1. Findings suggestive of a nondisplaced tear at the base of the posteroinferior labrum. 2. No rotator cuff tear identified. MRI 03/24/24: IMPRESSION: 1. Cervical spondylosis as outlined  within the body of the report. 2. At C5-C6, there is moderate disc degeneration. A disc protrusion contributes to mild-to-moderate spinal canal stenosis to the right (with mild spinal cord flattening). Mild relative right neural foraminal narrowing also present at this level. 3. At C6-C7, there is moderate disc degeneration. A disc protrusion results in mild-to-moderate spinal canal stenosis (greater to the right) with mild spinal cord flattening. 4. No significant spinal canal or foraminal stenosis at the remaining cervical levels. 5. Multilevel facet arthropathy, greatest on the right at C4-C5 (moderate) and on the right at C5-C6 (mild-to-moderate). 6. Nonspecific reversal of the expected cervical lordosis. 7. Levocurvature of the cervical spine. 8. Grade 1 anterolisthesis at C3-C4 and C4-C5.  PATIENT SURVEYS:  [name] 22.73 8/7: Quick Dash 18.18 9/25: Quick Dash    POSTURE:  Thoracic dextro/lumbar levoscoliosis with Rt shoulder elevation and scapular winging  HAND DOMINANCE:  Right    Body Part #1 Shoulder   UPPER EXTREMITY MMT: EVAL: Able to demo strength hold against resistance with poor scapular control.   MMT lb Right 9/25  Left 9/25  Shoulder flexion 8.5 9.1  Shoulder extension    Shoulder abduction 10.9 12.2  Shoulder adduction    Shoulder internal rotation 10.9 11.7  Shoulder external rotation 12.2 8.7  Elbow flexion    Elbow extension    (Blank rows = not tested)   9/2 cervical sidebend Rt 28 p! / Lt 34, rotation Rt 42 p!/ Lt 48 9/25: cervical sidebend Lt = pull on right upper trap; sidebend Rt = pull on left but pinch on Rt                                                                                                             TREATMENT DATE:  Treatment  10/30: Blank lines following charge title = not provided on this treatment date.   Manual:  TPDN No STM Rt hip There-ex: Seated eversion yellow  tband There-Act:  Self Care:  Nuro-Re-ed: Toe yoga Towel scrunches Seated heel raise ball bw ankles- also in standing SLS with arch lift Gait Training:    Treatment                            10/27: Blank lines following charge title = not provided on this treatment date.   Manual:  TPDN YES Trigger Point Dry Needling  Subsequent Treatment: Instructions provided previously at initial dry needling treatment.   Patient Verbal Consent Given: Yes Education Handout Provided: Previously Provided Muscles Treated: Lt peroneals and gastroc Electrical Stimulation Performed: No Treatment Response/Outcome: decreased concordant pain Ice massage Roller education  Median, ulnar and radial nerve glides, passive in supine There-ex: Standing gastroc & soleus stretch There-Act: Bike upper body posture- mini push up + small cervical extension -> chin tuck & press up Discussed bike shoes & rationale for foot  PATIENT EDUCATION:  Education details: Anatomy of condition, POC, HEP, exercise form/rationale Person educated: Patient Education method: Explanation, Demonstration, Tactile cues, Verbal cues, and Handouts Education comprehension: verbalized understanding, returned demonstration, verbal cues required, tactile cues required, and needs further education  HOME EXERCISE PROGRAM: Access Code: [email or web address] URL: [email or web address] Date: 02/15/2024 Prepared by: Harlene Cordon  Rt hand holding mid cervical vertebrae, head turning right Prone cervical retraction, adding small head turns  Foot/ankle: [id number]  ASSESSMENT:  CLINICAL IMPRESSION: Continue to focus on foot and ankle pain today to manage biomechanical chain effects that have extended to the right hip and could further progress to the cervical region.  Patient no longer needs to continue wearing the boot but will moved to supportive tennis shoes.  Was able to demonstrate standing heel raise with ball between  ankles for proper alignment without pain.  Significant limitation in eversion strength, strengthening was added to home exercise program.  Patient plans to purchase cycling shoes before next appointment.  REHAB POTENTIAL: Good  CLINICAL DECISION MAKING: Stable/uncomplicated  EVALUATION COMPLEXITY: Low   GOALS: Goals reviewed with patient? Yes  SHORT TERM GOALS: Target date: 5/24  Able to demo scapular control with UE MMT Baseline: Goal status: met  2.  Verbalize ability to correct posture throughout the day, particularly at work with her right arm reaching forward to a mouse/keyboard Baseline:  Goal status: met  3.  Begin incorporating UE strength back into regular gym workout Baseline:  Goal status: partially met- low level for continued neuro re-ed    LONG TERM GOALS: Target date: POC date  Able to strengthen upper body during regular workouts with minimal to no discomfort in shoulder Baseline:  Goal status: INITIAL  2.  Quick Hollis to improve by MDC Baseline:  Goal status: INITIAL  3.  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  Able to maintain minimal to no pain throughout regular daily activities Baseline:  Goal status: INITIAL    PLAN:  PT FREQUENCY: 1-2x/week  PT DURATION: POC date  PLANNED INTERVENTIONS: 97164- PT Re-evaluation, 97750- Physical Performance Testing, 97110-Therapeutic exercises, 97530- Therapeutic activity, [id number]- Neuromuscular re-education, 97535- Self Care, [id number]- Manual therapy, [phone]- Aquatic Therapy, Patient/Family education, Taping, Dry Needling, Joint mobilization, Spinal mobilization, Scar  mobilization, Cryotherapy, and Moist heat.  PLAN FOR NEXT SESSION: STM/DN PRN, continue length and activation along the Rt to Lt post diagonal chain   Khoa Opdahl C. Clara Herbison PT, DPT 08/21/24 8:32 PM

## 2024-08-27 DIAGNOSIS — F411 Generalized anxiety disorder: Secondary | ICD-10-CM | POA: Diagnosis not present

## 2024-08-28 ENCOUNTER — Encounter (HOSPITAL_BASED_OUTPATIENT_CLINIC_OR_DEPARTMENT_OTHER): Payer: Self-pay | Admitting: Physical Therapy

## 2024-08-28 ENCOUNTER — Ambulatory Visit (HOSPITAL_BASED_OUTPATIENT_CLINIC_OR_DEPARTMENT_OTHER): Attending: Orthopaedic Surgery | Admitting: Physical Therapy

## 2024-08-28 DIAGNOSIS — M25511 Pain in right shoulder: Secondary | ICD-10-CM | POA: Insufficient documentation

## 2024-08-28 DIAGNOSIS — G8929 Other chronic pain: Secondary | ICD-10-CM | POA: Diagnosis not present

## 2024-08-28 DIAGNOSIS — R293 Abnormal posture: Secondary | ICD-10-CM | POA: Diagnosis not present

## 2024-08-28 NOTE — Therapy (Signed)
 OUTPATIENT PHYSICAL THERAPY TREATMENT   Patient Name: Madison [name] MRN: [id number] DOB:26-Mar-1986, 38 y.o., female Today's Date: 08/28/2024  END OF SESSION:  PT End of Session - 08/28/24 1433     Visit Number 28    Date for Recertification  10/22/24    Authorization Type [facility] [employer]    PT Start Time 1430    PT Stop Time 1515    PT Time Calculation (min) 45 min    Activity Tolerance Patient tolerated treatment well    Behavior During Therapy [facility] for tasks assessed/performed                      Past Medical History:  Diagnosis Date   Allergy    Asthma    Hx of migraines 01/012003   can tell when they come on, treats with excedrin migraine.   Scoliosis 10/24/1995   Vitamin D  deficiency    Past Surgical History:  Procedure Laterality Date   partial spinal fusion C7-T2 due to scoliosis 03/1998 N/A 04/11/1998   TONSILLECTOMY     WISDOM TOOTH EXTRACTION     Patient Active Problem List   Diagnosis Date Noted   DDD (degenerative disc disease), cervical 05/05/2024   Fatigue 10/05/2023   Right shoulder pain 04/24/2023   Hyperlipidemia 02/25/2023   Arthralgia of right acromioclavicular joint 11/28/2022   Puncture wound of skin from metal nail 07/27/2022   B12 deficiency 02/08/2022   Vitamin D  deficiency 02/08/2022   Allergy, unspecified, initial encounter 01/25/2022   Laceration without foreign body of other finger without damage to nail, initial encounter 01/25/2022   Carpal tunnel syndrome on right 11/07/2021   Dorsalgia, unspecified 08/21/2021   Migraine, unspecified, not intractable, without status migrainosus 08/21/2021   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   Chronic urticaria 05/11/2021   Other adverse food reactions, not elsewhere classified, subsequent encounter 05/11/2021   Seasonal and perennial allergic rhinitis 05/11/2021   Mild intermittent asthma without complication 02/02/2021   Trigger point of right shoulder region 04/07/2020    Nonallopathic lesion of lumbosacral region 11/07/2018   Nonallopathic lesion of sacral region 11/07/2018   Nonallopathic lesion of thoracic region 11/07/2018   Volar plate injury of finger 01/22/2018   Scoliosis 08/21/2017    REFERRING PROVIDER: Bonner Hair, MD  REFERRING DIAG: Rt shoulder posterior labrum tear  Rationale for Evaluation and Treatment: Rehabilitation  THERAPY DIAG:  Chronic right shoulder pain  Abnormal posture  ONSET DATE: chronic back pain with subacute on chronic shoulder pain (March 2024)   SUBJECTIVE:  SUBJECTIVE STATEMENT:  Foot felt perfect when using shoes clipped in. My whole spine hurts. It feels like there is something in that spot (rubbing Rt C6 costovertebral area from before). No tingling into UE but has noticed some warmth spreading into right shoulder threatening to create N/T. Still having significant difficulty sleeping.       Cervical epidural on 03/31/24 Rt C6-C7. Repeat injection on 7/18.    EVAL: Have been seeing Dr claudene for about 6 years due to scoliosis. Neck pain without N/T. Rt-handed.   PERTINENT HISTORY:  Scoliosis with fusions that began at 38 yo   PAIN:  Are you having pain? Yes: NPRS scale: 6 Pain location: spine Pain description: hurts Aggravating factors: constant right now, arms reaching forward on bike created spasm/cramping Relieving factors: ice  PRECAUTIONS:  None  RED FLAGS: None   WEIGHT BEARING RESTRICTIONS:  No  FALLS:  Has patient fallen in last 6 months? No  OCCUPATION:  [employer]  PLOF:  Independent  PATIENT GOALS:  Avoid surgery, decr pain   OBJECTIVE:  Note: Objective measures were completed at Evaluation unless otherwise noted.  DIAGNOSTIC FINDINGS:  MRI 01/23/24: IMPRESSION: 1.  Findings suggestive of a nondisplaced tear at the base of the posteroinferior labrum. 2. No rotator cuff tear identified. MRI 03/24/24: IMPRESSION: 1. Cervical spondylosis as outlined within the body of the report. 2. At C5-C6, there is moderate disc degeneration. A disc protrusion contributes to mild-to-moderate spinal canal stenosis to the right (with mild spinal cord flattening). Mild relative right neural foraminal narrowing also present at this level. 3. At C6-C7, there is moderate disc degeneration. A disc protrusion results in mild-to-moderate spinal canal stenosis (greater to the right) with mild spinal cord flattening. 4. No significant spinal canal or foraminal stenosis at the remaining cervical levels. 5. Multilevel facet arthropathy, greatest on the right at C4-C5 (moderate) and on the right at C5-C6 (mild-to-moderate). 6. Nonspecific reversal of the expected cervical lordosis. 7. Levocurvature of the cervical spine. 8. Grade 1 anterolisthesis at C3-C4 and C4-C5.  PATIENT SURVEYS:  [name] 22.73 8/7: Quick Dash 18.18 9/25: Quick Dash     POSTURE:  Thoracic dextro/lumbar levoscoliosis with Rt shoulder elevation and scapular winging  HAND DOMINANCE:  Right    Body Part #1 Shoulder   UPPER EXTREMITY MMT: EVAL: Able to demo strength hold against resistance with poor scapular control.   MMT lb Right 9/25  Left 9/25  Shoulder flexion 8.5 9.1  Shoulder extension    Shoulder abduction 10.9 12.2  Shoulder adduction    Shoulder internal rotation 10.9 11.7  Shoulder external rotation 12.2 8.7  Elbow flexion    Elbow extension    (Blank rows = not tested)   9/2 cervical sidebend Rt 28 p! / Lt 34, rotation Rt 42 p!/ Lt 48 9/25: cervical sidebend Lt = pull on right upper trap; sidebend Rt = pull on left but pinch on Rt 11/6: pinching at upper throacic, Rt side with Rt cervical sidebend (resolved following manual therapy)  TREATMENT DATE:  11/6 Discussed bike shoes, force through foot, upcoming bike fitting Supine cervical traction Supine Lt to Rt cervical mobs (grade 2) paired with inferior Rt first rib mobs (grade 2) Suboccipital release STM Rt thoracic paraspinals & levator scap LT sidelying scapular mobs & contract/relax Seated Rt cervical sidbend with mob to C7 rib   Treatment                            10/30: Blank lines following charge title = not provided on this treatment date.   Manual:  TPDN No STM Rt hip There-ex: Seated eversion yellow tband There-Act:  Self Care:  Nuro-Re-ed: Toe yoga Towel scrunches Seated heel raise ball bw ankles- also in standing SLS with arch lift Gait Training:    Treatment                            10/27: Blank lines following charge title = not provided on this treatment date.   Manual:  TPDN YES Trigger Point Dry Needling  Subsequent Treatment: Instructions provided previously at initial dry needling treatment.   Patient Verbal Consent Given: Yes Education Handout Provided: Previously Provided Muscles Treated: Lt peroneals and gastroc Electrical Stimulation Performed: No Treatment Response/Outcome: decreased concordant pain Ice massage Roller education  Median, ulnar and radial nerve glides, passive in supine There-ex: Standing gastroc & soleus stretch There-Act: Bike upper body posture- mini push up + small cervical extension -> chin tuck & press up Discussed bike shoes & rationale for foot  PATIENT EDUCATION:  Education details: Anatomy of condition, POC, HEP, exercise form/rationale Person educated: Patient Education method: Explanation, Demonstration, Tactile cues, Verbal cues, and Handouts Education comprehension: verbalized understanding, returned demonstration, verbal cues required, tactile cues required, and needs further education  HOME EXERCISE  PROGRAM: Access Code: [email or web address] URL: [email or web address] Date: 02/15/2024 Prepared by: Harlene Cordon  Rt hand holding mid cervical vertebrae, head turning right Prone cervical retraction, adding small head turns  Foot/ankle: [id number]  ASSESSMENT:  CLINICAL IMPRESSION: Was able to decrease pinching with Rt sidebend utilizing the seated rib mob with movement today. In general, muscle tension and spasm is improving but pt continues to have pain. She is not sleeping well which is likely a large contributor. Has an upcoming bike fitting to ensure that everything is well set up for proper mechanics in riding. Extending POC to continue addressing postural limitations and gross strength to improve central strength for distal stability.   REHAB POTENTIAL: Good  CLINICAL DECISION MAKING: Stable/uncomplicated  EVALUATION COMPLEXITY: Low   GOALS: Goals reviewed with patient? Yes  SHORT TERM GOALS: Target date: 5/24  Able to demo scapular control with UE MMT Baseline: Goal status: met  2.  Verbalize ability to correct posture throughout the day, particularly at work with her right arm reaching forward to a mouse/keyboard Baseline:  Goal status: met  3.  Begin incorporating UE strength back into regular gym workout Baseline:  Goal status: partially met- low level for continued neuro re-ed    LONG TERM GOALS: Target date: POC date  Able to strengthen upper body during regular workouts with minimal to no discomfort in shoulder Baseline:  Goal status: INITIAL  2.  Quick Hollis to improve by MDC Baseline:  Goal status: INITIAL  3.  Pt will return to trying wheel pottery with postural awareness Baseline: I did a standing one and my shoulder does not like it  Goal status: ongoing, maybe defer depending on pt  4.  Able to maintain minimal to no pain throughout regular daily activities Baseline:  Goal status: INITIAL    PLAN:  PT FREQUENCY: 1-2x/week  PT  DURATION: POC date  PLANNED INTERVENTIONS: 97164- PT Re-evaluation, 97750- Physical Performance Testing, 97110-Therapeutic exercises, 97530- Therapeutic activity, [id number]- Neuromuscular re-education, 97535- Self Care, 02859- Manual therapy, [phone]- Aquatic Therapy, Patient/Family education, Taping, Dry Needling, Joint mobilization, Spinal mobilization, Scar mobilization, Cryotherapy, and Moist heat.  PLAN FOR NEXT SESSION: STM/DN PRN, continue length and activation along the Rt to Lt post diagonal chain   Rameen Gohlke C. Minnah Llamas PT, DPT 08/28/24 4:54 PM

## 2024-09-04 ENCOUNTER — Encounter (HOSPITAL_BASED_OUTPATIENT_CLINIC_OR_DEPARTMENT_OTHER): Admitting: Physical Therapy

## 2024-09-06 ENCOUNTER — Ambulatory Visit (HOSPITAL_BASED_OUTPATIENT_CLINIC_OR_DEPARTMENT_OTHER): Admitting: Physical Therapy

## 2024-09-10 ENCOUNTER — Other Ambulatory Visit (HOSPITAL_COMMUNITY): Payer: Self-pay

## 2024-09-10 ENCOUNTER — Other Ambulatory Visit: Payer: Self-pay | Admitting: Family Medicine

## 2024-09-10 DIAGNOSIS — F411 Generalized anxiety disorder: Secondary | ICD-10-CM | POA: Diagnosis not present

## 2024-09-10 MED ORDER — VENLAFAXINE HCL ER 75 MG PO CP24
75.0000 mg | ORAL_CAPSULE | Freq: Every day | ORAL | 0 refills | Status: DC
  Filled 2024-09-10: qty 90, 90d supply, fill #0

## 2024-09-11 ENCOUNTER — Ambulatory Visit (HOSPITAL_BASED_OUTPATIENT_CLINIC_OR_DEPARTMENT_OTHER): Admitting: Physical Therapy

## 2024-09-11 ENCOUNTER — Telehealth: Admitting: Physician Assistant

## 2024-09-11 ENCOUNTER — Other Ambulatory Visit (HOSPITAL_BASED_OUTPATIENT_CLINIC_OR_DEPARTMENT_OTHER): Payer: Self-pay

## 2024-09-11 DIAGNOSIS — B9689 Other specified bacterial agents as the cause of diseases classified elsewhere: Secondary | ICD-10-CM | POA: Diagnosis not present

## 2024-09-11 DIAGNOSIS — J208 Acute bronchitis due to other specified organisms: Secondary | ICD-10-CM | POA: Diagnosis not present

## 2024-09-11 MED ORDER — BENZONATATE 100 MG PO CAPS
100.0000 mg | ORAL_CAPSULE | Freq: Three times a day (TID) | ORAL | 0 refills | Status: AC | PRN
  Filled 2024-09-11: qty 30, 10d supply, fill #0

## 2024-09-11 MED ORDER — AZITHROMYCIN 250 MG PO TABS
ORAL_TABLET | ORAL | 0 refills | Status: AC
  Filled 2024-09-11: qty 6, 5d supply, fill #0

## 2024-09-11 MED ORDER — ALBUTEROL SULFATE HFA 108 (90 BASE) MCG/ACT IN AERS
2.0000 | INHALATION_SPRAY | Freq: Four times a day (QID) | RESPIRATORY_TRACT | 0 refills | Status: AC | PRN
  Filled 2024-09-11: qty 8.5, 30d supply, fill #0

## 2024-09-11 NOTE — Progress Notes (Signed)
 We are sorry that you are not feeling well.  Here is how we plan to help!  Based on your presentation I believe you most likely have A cough due to bacteria.  When patients have a fever and a productive cough with a change in color or increased sputum production, we are concerned about bacterial bronchitis.  If left untreated it can progress to pneumonia.  If your symptoms do not improve with your treatment plan it is important that you contact your provider.   I have prescribed Azithromyin 250 mg: two tablets now and then one tablet daily for 4 additonal days    In addition you may use A prescription cough medication called Tessalon  Perles 100mg . You may take 1-2 capsules every 8 hours as needed for your cough. I have also sent in an albuterol  inhaler to use as directed.  From your responses in the eVisit questionnaire you describe inflammation in the upper respiratory tract which is causing a significant cough.  This is commonly called Bronchitis and has four common causes:   Allergies Viral Infections Acid Reflux Bacterial Infection Allergies, viruses and acid reflux are treated by controlling symptoms or eliminating the cause. An example might be a cough caused by taking certain blood pressure medications. You stop the cough by changing the medication. Another example might be a cough caused by acid reflux. Controlling the reflux helps control the cough.  USE OF BRONCHODILATOR (RESCUE) INHALERS: There is a risk from using your bronchodilator too frequently.  The risk is that over-reliance on a medication which only relaxes the muscles surrounding the breathing tubes can reduce the effectiveness of medications prescribed to reduce swelling and congestion of the tubes themselves.  Although you feel brief relief from the bronchodilator inhaler, your asthma may actually be worsening with the tubes becoming more swollen and filled with mucus.  This can delay other crucial treatments, such as oral  steroid medications. If you need to use a bronchodilator inhaler daily, several times per day, you should discuss this with your provider.  There are probably better treatments that could be used to keep your asthma under control.     HOME CARE Only take medications as instructed by your medical team. Complete the entire course of an antibiotic. Drink plenty of fluids and get plenty of rest. Avoid close contacts especially the very young and the elderly Cover your mouth if you cough or cough into your sleeve. Always remember to wash your hands A steam or ultrasonic humidifier can help congestion.   GET HELP RIGHT AWAY IF: You develop worsening fever. You become short of breath You cough up blood. Your symptoms persist after you have completed your treatment plan MAKE SURE YOU  Understand these instructions. Will watch your condition. Will get help right away if you are not doing well or get worse.  Your e-visit answers were reviewed by a board certified advanced clinical practitioner to complete your personal care plan.  Depending on the condition, your plan could have included both over the counter or prescription medications. If there is a problem please reply  once you have received a response from your provider. Your safety is important to us .  If you have drug allergies check your prescription carefully.    You can use MyChart to ask questions about today's visit, request a non-urgent call back, or ask for a work or school excuse for 24 hours related to this e-Visit. If it has been greater than 24 hours you will need  to follow up with your provider, or enter a new e-Visit to address those concerns. You will get an e-mail in the next two days asking about your experience.  I hope that your e-visit has been valuable and will speed your recovery. Thank you for using e-visits.   I have spent 5 minutes in review of e-visit questionnaire, review and updating patient chart, medical  decision making and response to patient.   Elsie Velma Lunger, PA-C

## 2024-09-11 NOTE — Progress Notes (Signed)
 Message sent to patient requesting further input regarding current symptoms. Awaiting patient response.

## 2024-09-16 ENCOUNTER — Encounter (HOSPITAL_BASED_OUTPATIENT_CLINIC_OR_DEPARTMENT_OTHER): Payer: Self-pay | Admitting: Physical Therapy

## 2024-09-16 ENCOUNTER — Ambulatory Visit (HOSPITAL_BASED_OUTPATIENT_CLINIC_OR_DEPARTMENT_OTHER): Admitting: Physical Therapy

## 2024-09-16 DIAGNOSIS — M25511 Pain in right shoulder: Secondary | ICD-10-CM | POA: Diagnosis not present

## 2024-09-16 DIAGNOSIS — G8929 Other chronic pain: Secondary | ICD-10-CM | POA: Diagnosis not present

## 2024-09-16 DIAGNOSIS — R293 Abnormal posture: Secondary | ICD-10-CM

## 2024-09-16 NOTE — Therapy (Signed)
 OUTPATIENT PHYSICAL THERAPY TREATMENT   Patient Name: Madison [name] MRN: [id number] DOB:12-18-1985, 38 y.o., female Today's Date: 09/16/2024  END OF SESSION:  PT End of Session - 09/16/24 1104     Visit Number 29    Date for Recertification  10/22/24    Authorization Type [facility] [employer]    PT Start Time 1103    PT Stop Time 1141    PT Time Calculation (min) 38 min    Activity Tolerance Patient tolerated treatment well    Behavior During Therapy [facility] for tasks assessed/performed                       Past Medical History:  Diagnosis Date   Allergy    Asthma    Hx of migraines 01/012003   can tell when they come on, treats with excedrin migraine.   Scoliosis 10/24/1995   Vitamin D  deficiency    Past Surgical History:  Procedure Laterality Date   partial spinal fusion C7-T2 due to scoliosis 03/1998 N/A 04/11/1998   TONSILLECTOMY     WISDOM TOOTH EXTRACTION     Patient Active Problem List   Diagnosis Date Noted   DDD (degenerative disc disease), cervical 05/05/2024   Fatigue 10/05/2023   Right shoulder pain 04/24/2023   Hyperlipidemia 02/25/2023   Arthralgia of right acromioclavicular joint 11/28/2022   Puncture wound of skin from metal nail 07/27/2022   B12 deficiency 02/08/2022   Vitamin D  deficiency 02/08/2022   Allergy, unspecified, initial encounter 01/25/2022   Laceration without foreign body of other finger without damage to nail, initial encounter 01/25/2022   Carpal tunnel syndrome on right 11/07/2021   Dorsalgia, unspecified 08/21/2021   Migraine, unspecified, not intractable, without status migrainosus 08/21/2021   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   Chronic urticaria 05/11/2021   Other adverse food reactions, not elsewhere classified, subsequent encounter 05/11/2021   Seasonal and perennial allergic rhinitis 05/11/2021   Mild intermittent asthma without complication 02/02/2021   Trigger point of right shoulder region  04/07/2020   Nonallopathic lesion of lumbosacral region 11/07/2018   Nonallopathic lesion of sacral region 11/07/2018   Nonallopathic lesion of thoracic region 11/07/2018   Volar plate injury of finger 01/22/2018   Scoliosis 08/21/2017    REFERRING PROVIDER: Bonner Hair, MD  REFERRING DIAG: Rt shoulder posterior labrum tear  Rationale for Evaluation and Treatment: Rehabilitation  THERAPY DIAG:  Chronic right shoulder pain  Abnormal posture  ONSET DATE: chronic back pain with subacute on chronic shoulder pain (March 2024)   SUBJECTIVE:  SUBJECTIVE STATEMENT:  Able to maintain rib mobilization from last session.     Cervical epidural on 03/31/24 Rt C6-C7. Repeat injection on 7/18.    EVAL: Have been seeing Dr claudene for about 6 years due to scoliosis. Neck pain without N/T. Rt-handed.   PERTINENT HISTORY:  Scoliosis with fusions that began at 38 yo   PAIN:  Are you having pain? Yes: NPRS scale: 6 Pain location: spine Pain description: hurts Aggravating factors: constant right now, arms reaching forward on bike created spasm/cramping Relieving factors: ice  PRECAUTIONS:  None  RED FLAGS: None   WEIGHT BEARING RESTRICTIONS:  No  FALLS:  Has patient fallen in last 6 months? No  OCCUPATION:  [employer] for [employer]  PLOF:  Independent  PATIENT GOALS:  Avoid surgery, decr pain   OBJECTIVE:  Note: Objective measures were completed at Evaluation unless otherwise noted.  DIAGNOSTIC FINDINGS:  MRI 01/23/24: IMPRESSION: 1. Findings suggestive of a nondisplaced tear at the base of the posteroinferior labrum. 2. No rotator cuff tear identified. MRI 03/24/24: IMPRESSION: 1. Cervical spondylosis as outlined within the body of the report. 2. At C5-C6, there is moderate disc  degeneration. A disc protrusion contributes to mild-to-moderate spinal canal stenosis to the right (with mild spinal cord flattening). Mild relative right neural foraminal narrowing also present at this level. 3. At C6-C7, there is moderate disc degeneration. A disc protrusion results in mild-to-moderate spinal canal stenosis (greater to the right) with mild spinal cord flattening. 4. No significant spinal canal or foraminal stenosis at the remaining cervical levels. 5. Multilevel facet arthropathy, greatest on the right at C4-C5 (moderate) and on the right at C5-C6 (mild-to-moderate). 6. Nonspecific reversal of the expected cervical lordosis. 7. Levocurvature of the cervical spine. 8. Grade 1 anterolisthesis at C3-C4 and C4-C5.  PATIENT SURVEYS:  [name] 22.73 8/7: Quick Dash 18.18 9/25: Quick Dash     POSTURE:  Thoracic dextro/lumbar levoscoliosis with Rt shoulder elevation and scapular winging  HAND DOMINANCE:  Right    Body Part #1 Shoulder   UPPER EXTREMITY MMT: EVAL: Able to demo strength hold against resistance with poor scapular control.   MMT lb Right 9/25  Left 9/25 Rt/Lt 11/24  Shoulder flexion 8.5 9.1 13/18  Shoulder extension     Shoulder abduction 10.9 12.2 14.3/14.5  Shoulder adduction     Shoulder internal rotation 10.9 11.7 11.3/12.2  Shoulder external rotation 12.2 8.7 10.3/10.6  Elbow flexion     Elbow extension     (Blank rows = not tested)   9/2 cervical sidebend Rt 28 p! / Lt 34, rotation Rt 42 p!/ Lt 48 9/25: cervical sidebend Lt = pull on right upper trap; sidebend Rt = pull on left but pinch on Rt 11/6: pinching at upper throacic, Rt side with Rt cervical sidebend (resolved following manual therapy) 11/25: cervical sidebend 30 deg bilat. Rt rotation 60, Lt rotation 70  TREATMENT DATE:  09/16/24 Discussed bike fitting &  results Measurements & discussion of functional contribution Prone STM Rt periscap, levator scap, scapular glides Prone scap retraction  11/6 Discussed bike shoes, force through foot, upcoming bike fitting Supine cervical traction Supine Lt to Rt cervical mobs (grade 2) paired with inferior Rt first rib mobs (grade 2) Suboccipital release STM Rt thoracic paraspinals & levator scap LT sidelying scapular mobs & contract/relax Seated Rt cervical sidbend with mob to C7 rib   Treatment                            10/30: Blank lines following charge title = not provided on this treatment date.   Manual:  TPDN No STM Rt hip There-ex: Seated eversion yellow tband There-Act:  Self Care:  Nuro-Re-ed: Toe yoga Towel scrunches Seated heel raise ball bw ankles- also in standing SLS with arch lift Gait Training:     PATIENT EDUCATION:  Education details: Anatomy of condition, POC, HEP, exercise form/rationale Person educated: Patient Education method: Explanation, Demonstration, Tactile cues, Verbal cues, and Handouts Education comprehension: verbalized understanding, returned demonstration, verbal cues required, tactile cues required, and needs further education  HOME EXERCISE PROGRAM: Access Code: [email or web address] URL: [email or web address] Date: 02/15/2024 Prepared by: Harlene Cordon  Rt hand holding mid cervical vertebrae, head turning right Prone cervical retraction, adding small head turns  Foot/ankle: [id number]  ASSESSMENT:  CLINICAL IMPRESSION: Pt verbalizes goals of riding endurance ride of 100 miles and challenge of riding the mountains. Pt will work on endurance training and will progress UE strengthening for stability in PT while keeping proper posture in mind.   REHAB POTENTIAL: Good  CLINICAL DECISION MAKING: Stable/uncomplicated  EVALUATION COMPLEXITY: Low   GOALS: Goals reviewed with patient? Yes  SHORT TERM GOALS: Target date: 5/24  Able  to demo scapular control with UE MMT Baseline: Goal status: met  2.  Verbalize ability to correct posture throughout the day, particularly at work with her right arm reaching forward to a mouse/keyboard Baseline:  Goal status: met  3.  Begin incorporating UE strength back into regular gym workout Baseline:  Goal status: partially met- low level for continued neuro re-ed    LONG TERM GOALS: Target date: POC date  Able to strengthen upper body during regular workouts with minimal to no discomfort in shoulder Baseline:  Goal status: INITIAL  2.  Quick Hollis to improve by MDC Baseline:  Goal status: INITIAL  3.  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  Able to maintain minimal to no pain throughout regular daily activities Baseline:  Goal status: INITIAL    PLAN:  PT FREQUENCY: 1-2x/week  PT DURATION: POC date  PLANNED INTERVENTIONS: 97164- PT Re-evaluation, 97750- Physical Performance Testing, 97110-Therapeutic exercises, 97530- Therapeutic activity, [id number]- Neuromuscular re-education, 97535- Self Care, 02859- Manual therapy, [phone]- Aquatic Therapy, Patient/Family education, Taping, Dry Needling, Joint mobilization, Spinal mobilization, Scar mobilization, Cryotherapy, and Moist heat.  PLAN FOR NEXT SESSION: STM/DN PRN, continue length and activation along the Rt to Lt post diagonal chain   Jacque Byron C. [place] Sarinana PT, DPT 09/16/24 1:18 PM

## 2024-09-23 ENCOUNTER — Ambulatory Visit (HOSPITAL_BASED_OUTPATIENT_CLINIC_OR_DEPARTMENT_OTHER): Attending: Orthopaedic Surgery | Admitting: Physical Therapy

## 2024-09-23 ENCOUNTER — Encounter (HOSPITAL_BASED_OUTPATIENT_CLINIC_OR_DEPARTMENT_OTHER): Payer: Self-pay | Admitting: Physical Therapy

## 2024-09-23 DIAGNOSIS — M25511 Pain in right shoulder: Secondary | ICD-10-CM | POA: Diagnosis present

## 2024-09-23 DIAGNOSIS — G8929 Other chronic pain: Secondary | ICD-10-CM | POA: Insufficient documentation

## 2024-09-23 DIAGNOSIS — R293 Abnormal posture: Secondary | ICD-10-CM | POA: Diagnosis present

## 2024-09-23 NOTE — Therapy (Signed)
 OUTPATIENT PHYSICAL THERAPY TREATMENT   Patient Name: Madison [name] MRN: [id number] DOB:Feb 05, 1986, 38 y.o., female Today's Date: 09/23/2024  END OF SESSION:  PT End of Session - 09/23/24 1148     Visit Number 30    Date for Recertification  10/22/24    Authorization Type [facility] [employer]    PT Start Time 1147    PT Stop Time 1225    PT Time Calculation (min) 38 min    Activity Tolerance Patient tolerated treatment well    Behavior During Therapy [facility] for tasks assessed/performed                        Past Medical History:  Diagnosis Date   Allergy    Asthma    Hx of migraines 01/012003   can tell when they come on, treats with excedrin migraine.   Scoliosis 10/24/1995   Vitamin D  deficiency    Past Surgical History:  Procedure Laterality Date   partial spinal fusion C7-T2 due to scoliosis 03/1998 N/A 04/11/1998   TONSILLECTOMY     WISDOM TOOTH EXTRACTION     Patient Active Problem List   Diagnosis Date Noted   DDD (degenerative disc disease), cervical 05/05/2024   Fatigue 10/05/2023   Right shoulder pain 04/24/2023   Hyperlipidemia 02/25/2023   Arthralgia of right acromioclavicular joint 11/28/2022   Puncture wound of skin from metal nail 07/27/2022   B12 deficiency 02/08/2022   Vitamin D  deficiency 02/08/2022   Allergy, unspecified, initial encounter 01/25/2022   Laceration without foreign body of other finger without damage to nail, initial encounter 01/25/2022   Carpal tunnel syndrome on right 11/07/2021   Dorsalgia, unspecified 08/21/2021   Migraine, unspecified, not intractable, without status migrainosus 08/21/2021   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   Chronic urticaria 05/11/2021   Other adverse food reactions, not elsewhere classified, subsequent encounter 05/11/2021   Seasonal and perennial allergic rhinitis 05/11/2021   Mild intermittent asthma without complication 02/02/2021   Trigger point of right shoulder region  04/07/2020   Nonallopathic lesion of lumbosacral region 11/07/2018   Nonallopathic lesion of sacral region 11/07/2018   Nonallopathic lesion of thoracic region 11/07/2018   Volar plate injury of finger 01/22/2018   Scoliosis 08/21/2017    REFERRING PROVIDER: Bonner Hair, MD  REFERRING DIAG: Rt shoulder posterior labrum tear  Rationale for Evaluation and Treatment: Rehabilitation  THERAPY DIAG:  Chronic right shoulder pain  Abnormal posture  ONSET DATE: chronic back pain with subacute on chronic shoulder pain (March 2024)   SUBJECTIVE:  SUBJECTIVE STATEMENT:  Some tightness in Rt lower cervical region. Neck does not like my hair in a ponytail. Did another 16 mile ride and felt very comfortable.     Cervical epidural on 03/31/24 Rt C6-C7. Repeat injection on 7/18.    EVAL: Have been seeing Dr claudene for about 6 years due to scoliosis. Neck pain without N/T. Rt-handed.   PERTINENT HISTORY:  Scoliosis with fusions that began at 38 yo   PAIN:  Are you having pain? Yes: NPRS scale: 6 Pain location: spine Pain description: hurts Aggravating factors: constant right now, arms reaching forward on bike created spasm/cramping Relieving factors: ice  PRECAUTIONS:  None  RED FLAGS: None   WEIGHT BEARING RESTRICTIONS:  No  FALLS:  Has patient fallen in last 6 months? No  OCCUPATION:  [employer]  PLOF:  Independent  PATIENT GOALS:  Avoid surgery, decr pain   OBJECTIVE:  Note: Objective measures were completed at Evaluation unless otherwise noted.  DIAGNOSTIC FINDINGS:  MRI 01/23/24: IMPRESSION: 1. Findings suggestive of a nondisplaced tear at the base of the posteroinferior labrum. 2. No rotator cuff tear identified. MRI 03/24/24: IMPRESSION: 1. Cervical  spondylosis as outlined within the body of the report. 2. At C5-C6, there is moderate disc degeneration. A disc protrusion contributes to mild-to-moderate spinal canal stenosis to the right (with mild spinal cord flattening). Mild relative right neural foraminal narrowing also present at this level. 3. At C6-C7, there is moderate disc degeneration. A disc protrusion results in mild-to-moderate spinal canal stenosis (greater to the right) with mild spinal cord flattening. 4. No significant spinal canal or foraminal stenosis at the remaining cervical levels. 5. Multilevel facet arthropathy, greatest on the right at C4-C5 (moderate) and on the right at C5-C6 (mild-to-moderate). 6. Nonspecific reversal of the expected cervical lordosis. 7. Levocurvature of the cervical spine. 8. Grade 1 anterolisthesis at C3-C4 and C4-C5.  PATIENT SURVEYS:  Madison [name] 22.73 8/7: Quick Dash 18.18 9/25: Quick Dash     POSTURE:  Thoracic dextro/lumbar levoscoliosis with Rt shoulder elevation and scapular winging  HAND DOMINANCE:  Right    Body Part #1 Shoulder   UPPER EXTREMITY MMT: EVAL: Able to demo strength hold against resistance with poor scapular control.   MMT lb Right 9/25  Left 9/25 Rt/Lt 11/24  Shoulder flexion 8.5 9.1 13/18  Shoulder extension     Shoulder abduction 10.9 12.2 14.3/14.5  Shoulder adduction     Shoulder internal rotation 10.9 11.7 11.3/12.2  Shoulder external rotation 12.2 8.7 10.3/10.6  Elbow flexion     Elbow extension     (Blank rows = not tested)   9/2 cervical sidebend Rt 28 p! / Lt 34, rotation Rt 42 p!/ Lt 48 9/25: cervical sidebend Lt = pull on right upper trap; sidebend Rt = pull on left but pinch on Rt 11/6: pinching at upper throacic, Rt side with Rt cervical sidebend (resolved following manual therapy) 11/25: cervical sidebend 30 deg bilat. Rt rotation 60, Lt rotation 70  TREATMENT DATE:  09/23/24 STM bilateral upper trap & levator, manual cervical distraction with sidebend stretching STM along scapular border- Rt side Supine rib breathing for mobility over rolled hand towel Discussed POC, posture and long term plan  09/16/24 Discussed bike fitting & results Measurements & discussion of functional contribution Prone STM Rt periscap, levator scap, scapular glides Prone scap retraction  11/6 Discussed bike shoes, force through foot, upcoming bike fitting Supine cervical traction Supine Lt to Rt cervical mobs (grade 2) paired with inferior Rt first rib mobs (grade 2) Suboccipital release STM Rt thoracic paraspinals & levator scap LT sidelying scapular mobs & contract/relax Seated Rt cervical sidbend with mob to C7 rib     PATIENT EDUCATION:  Education details: Teacher, Music of condition, POC, HEP, exercise form/rationale Person educated: Patient Education method: Explanation, Demonstration, Tactile cues, Verbal cues, and Handouts Education comprehension: verbalized understanding, returned demonstration, verbal cues required, tactile cues required, and needs further education  HOME EXERCISE PROGRAM: Access Code: [email or web address] URL: [email or web address] Date: 02/15/2024 Prepared by: Harlene Cordon  Rt hand holding mid cervical vertebrae, head turning right Prone cervical retraction, adding small head turns  Foot/ankle: [id number]  ASSESSMENT:  CLINICAL IMPRESSION: Pt with tightness through levator scap but able to reduce with manual therapy. Pt did well with rib block movement and will be able to continue doing this. Discussed using stair climber and resistance on trainer at home to begin lower body strength for cycling mountain climb. Will benefit from stability training to pair with strength to decrease risk of re-injury through the upper body.   REHAB POTENTIAL: Good  CLINICAL DECISION MAKING:  Stable/uncomplicated  EVALUATION COMPLEXITY: Low   GOALS: Goals reviewed with patient? Yes  SHORT TERM GOALS: Target date: 5/24  Able to demo scapular control with UE MMT Baseline: Goal status: met  2.  Verbalize ability to correct posture throughout the day, particularly at work with her right arm reaching forward to a mouse/keyboard Baseline:  Goal status: met  3.  Begin incorporating UE strength back into regular gym workout Baseline:  Goal status: partially met- low level for continued neuro re-ed    LONG TERM GOALS: Target date: POC date  Able to strengthen upper body during regular workouts with minimal to no discomfort in shoulder Baseline:  Goal status: INITIAL  2.  Quick Hollis to improve by MDC Baseline:  Goal status: INITIAL  3.  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  Able to maintain minimal to no pain throughout regular daily activities Baseline:  Goal status: INITIAL    PLAN:  PT FREQUENCY: 1-2x/week  PT DURATION: POC date  PLANNED INTERVENTIONS: 97164- PT Re-evaluation, 97750- Physical Performance Testing, 97110-Therapeutic exercises, 97530- Therapeutic activity, [id number]- Neuromuscular re-education, 97535- Self Care, 02859- Manual therapy, [phone]- Aquatic Therapy, Patient/Family education, Taping, Dry Needling, Joint mobilization, Spinal mobilization, Scar mobilization, Cryotherapy, and Moist heat.  PLAN FOR NEXT SESSION: bosu balance   Madison [name] C. Deniah Saia PT, DPT 09/23/24 1:11 PM

## 2024-09-24 DIAGNOSIS — F411 Generalized anxiety disorder: Secondary | ICD-10-CM | POA: Diagnosis not present

## 2024-10-01 DIAGNOSIS — F411 Generalized anxiety disorder: Secondary | ICD-10-CM | POA: Diagnosis not present

## 2024-10-02 ENCOUNTER — Encounter (HOSPITAL_BASED_OUTPATIENT_CLINIC_OR_DEPARTMENT_OTHER): Admitting: Physical Therapy

## 2024-10-02 NOTE — Progress Notes (Unsigned)
 Madison [name] [address] [place] [address] Phone: [phone] Subjective:   LILLETTE Berwyn Posey, am serving as a scribe for Dr. Arthea Claudene.  I'm seeing this patient by the request  of:  Geofm Glade PARAS, MD  CC: back and neck pain follow up   YEP:Dlagzrupcz  Chrisanne Zamzes Quinney is a 38 y.o. female coming in with complaint of back and neck pain. OMT 08/08/2024. Patient states continues to have some difficulty.  Patient feels like stress is still getting her way.  Feels that the neck though is better since she has been doing more of the physical therapy.  Medications patient has been prescribed: Effexor , Vit D  Taking: Yes         Reviewed prior external information including notes and imaging from previsou exam, outside providers and external EMR if available.   As well as notes that were available from care everywhere and other healthcare systems.  Past medical history, social, surgical and family history all reviewed in electronic medical record.  No pertanent information unless stated regarding to the chief complaint.   Past Medical History:  Diagnosis Date   Allergy    Asthma    Hx of migraines 01/012003   can tell when they come on, treats with excedrin migraine.   Scoliosis 10/24/1995   Vitamin D  deficiency     Allergies[1]   Review of Systems:  No headache, visual changes, nausea, vomiting, diarrhea, constipation, dizziness, abdominal pain, skin rash, fevers, chills, night sweats, weight loss, swollen lymph nodes, body aches, joint swelling, chest pain, shortness of breath, mood changes. POSITIVE muscle aches  Objective  Blood pressure 114/82, pulse 73, height 5' 3 (1.6 m), weight 144 lb (65.3 kg), SpO2 97%.   General: No apparent distress alert and oriented x3 mood and affect normal, dressed appropriately.  HEENT: Pupils equal, extraocular movements intact  Respiratory: Patient's speak in full sentences and does not appear  short of breath  Cardiovascular: No lower extremity edema, non tender, no erythema  Still tightness noted in the paraspinal musculature in the parascapular area on the right greater than the left.  Negative Spurling's noted today.    Osteopathic findings C5 flexed rotated and side bent right T2-T7 neutral rotated right side bent left. L4 flexed rotated and side bent left Sacrum right on right    Assessment and Plan:  Scoliosis Significant scoliosis.  Still not doing any advance HVLA.  Continuing note to respond extremely well to muscle energy and some physical therapy.  Patient has been biking and I think will do significantly better overall.  Discussed icing regimen and home exercises.  Follow-up with me again in 6 to 12 weeks.  Increase Effexor  to 112.5 mg daily.    Nonallopathic problems  Decision today to treat with OMT was based on Physical Exam  After verbal consent patient was treated with HVLA, ME, FPR techniques in cervical, rib, thoracic, lumbar, and sacral  areas  Patient tolerated the procedure well with improvement in symptoms  Patient given exercises, stretches and lifestyle modifications  See medications in patient instructions if given  Patient will follow up in 4-8 weeks    The above documentation has been reviewed and is accurate and complete Fate Caster M Maleki Hippe, DO          Note: This dictation was prepared with Dragon dictation along with smaller phrase technology. Any transcriptional errors that result from this process are unintentional.            [  1]  Allergies Allergen Reactions   Kiwi Extract Anaphylaxis   Prednisone      Tingling in hands and face   Meloxicam      heartburn

## 2024-10-06 ENCOUNTER — Other Ambulatory Visit: Payer: Self-pay | Admitting: Family Medicine

## 2024-10-06 ENCOUNTER — Encounter: Payer: Self-pay | Admitting: Family Medicine

## 2024-10-06 ENCOUNTER — Ambulatory Visit: Admitting: Family Medicine

## 2024-10-06 ENCOUNTER — Other Ambulatory Visit: Payer: Self-pay

## 2024-10-06 ENCOUNTER — Other Ambulatory Visit (HOSPITAL_BASED_OUTPATIENT_CLINIC_OR_DEPARTMENT_OTHER): Payer: Self-pay

## 2024-10-06 VITALS — BP 114/82 | HR 73 | Ht 63.0 in | Wt 144.0 lb

## 2024-10-06 DIAGNOSIS — M9903 Segmental and somatic dysfunction of lumbar region: Secondary | ICD-10-CM

## 2024-10-06 DIAGNOSIS — M9904 Segmental and somatic dysfunction of sacral region: Secondary | ICD-10-CM

## 2024-10-06 DIAGNOSIS — M41125 Adolescent idiopathic scoliosis, thoracolumbar region: Secondary | ICD-10-CM

## 2024-10-06 DIAGNOSIS — M9908 Segmental and somatic dysfunction of rib cage: Secondary | ICD-10-CM | POA: Diagnosis not present

## 2024-10-06 DIAGNOSIS — M9902 Segmental and somatic dysfunction of thoracic region: Secondary | ICD-10-CM

## 2024-10-06 DIAGNOSIS — M9901 Segmental and somatic dysfunction of cervical region: Secondary | ICD-10-CM

## 2024-10-06 MED ORDER — VENLAFAXINE HCL ER 37.5 MG PO CP24
112.5000 mg | ORAL_CAPSULE | Freq: Every day | ORAL | 0 refills | Status: AC
  Filled 2024-10-06: qty 90, 30d supply, fill #0

## 2024-10-06 NOTE — Assessment & Plan Note (Signed)
 Significant scoliosis.  Still not doing any advance HVLA.  Continuing note to respond extremely well to muscle energy and some physical therapy.  Patient has been biking and I think will do significantly better overall.  Discussed icing regimen and home exercises.  Follow-up with me again in 6 to 12 weeks.  Increase Effexor  to 112.5 mg daily.

## 2024-10-06 NOTE — Patient Instructions (Addendum)
 Effexor  112.5mg  3pills daily Keep trying to workout Glad bike is doing good See me in 7-8 weeks

## 2024-10-07 ENCOUNTER — Encounter: Payer: Self-pay | Admitting: Family Medicine

## 2024-10-07 ENCOUNTER — Other Ambulatory Visit: Payer: Self-pay

## 2024-10-07 ENCOUNTER — Other Ambulatory Visit (HOSPITAL_COMMUNITY): Payer: Self-pay

## 2024-10-07 MED ORDER — VITAMIN D (ERGOCALCIFEROL) 1.25 MG (50000 UNIT) PO CAPS
50000.0000 [IU] | ORAL_CAPSULE | ORAL | 0 refills | Status: AC
  Filled 2024-10-07: qty 12, 84d supply, fill #0

## 2024-10-07 NOTE — Telephone Encounter (Signed)
Forwarding to Dr. Corey to review.  

## 2024-10-08 ENCOUNTER — Ambulatory Visit: Admitting: Family Medicine

## 2024-10-09 ENCOUNTER — Encounter (HOSPITAL_BASED_OUTPATIENT_CLINIC_OR_DEPARTMENT_OTHER): Payer: Self-pay | Admitting: Physical Therapy

## 2024-10-09 ENCOUNTER — Ambulatory Visit (HOSPITAL_BASED_OUTPATIENT_CLINIC_OR_DEPARTMENT_OTHER): Admitting: Physical Therapy

## 2024-10-09 DIAGNOSIS — M25511 Pain in right shoulder: Secondary | ICD-10-CM | POA: Diagnosis not present

## 2024-10-09 DIAGNOSIS — R293 Abnormal posture: Secondary | ICD-10-CM

## 2024-10-09 DIAGNOSIS — G8929 Other chronic pain: Secondary | ICD-10-CM

## 2024-10-09 NOTE — Therapy (Signed)
 OUTPATIENT PHYSICAL THERAPY TREATMENT   Patient Name: Madison [name] MRN: [id number] DOB:1986-05-13, 38 y.o., female Today's Date: 10/09/2024  END OF SESSION:  PT End of Session - 10/09/24 1600     Visit Number 31    Date for Recertification  10/22/24    Authorization Type [facility] [employer]    PT Start Time 1600    PT Stop Time 1640    PT Time Calculation (min) 40 min    Activity Tolerance Patient tolerated treatment well    Behavior During Therapy Madison [name] for tasks assessed/performed                        Past Medical History:  Diagnosis Date   Allergy    Asthma    Hx of migraines 01/012003   can tell when they come on, treats with excedrin migraine.   Scoliosis 10/24/1995   Vitamin D  deficiency    Past Surgical History:  Procedure Laterality Date   partial spinal fusion C7-T2 due to scoliosis 03/1998 N/A 04/11/1998   TONSILLECTOMY     WISDOM TOOTH EXTRACTION     Patient Active Problem List   Diagnosis Date Noted   DDD (degenerative disc disease), cervical 05/05/2024   Fatigue 10/05/2023   Right shoulder pain 04/24/2023   Hyperlipidemia 02/25/2023   Arthralgia of right acromioclavicular joint 11/28/2022   Puncture wound of skin from metal nail 07/27/2022   B12 deficiency 02/08/2022   Vitamin D  deficiency 02/08/2022   Allergy, unspecified, initial encounter 01/25/2022   Laceration without foreign body of other finger without damage to nail, initial encounter 01/25/2022   Carpal tunnel syndrome on right 11/07/2021   Dorsalgia, unspecified 08/21/2021   Migraine, unspecified, not intractable, without status migrainosus 08/21/2021   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   Chronic urticaria 05/11/2021   Other adverse food reactions, not elsewhere classified, subsequent encounter 05/11/2021   Seasonal and perennial allergic rhinitis 05/11/2021   Mild intermittent asthma without complication 02/02/2021   Trigger point of right shoulder region  04/07/2020   Nonallopathic lesion of lumbosacral region 11/07/2018   Nonallopathic lesion of sacral region 11/07/2018   Nonallopathic lesion of thoracic region 11/07/2018   Volar plate injury of finger 01/22/2018   Scoliosis 08/21/2017    REFERRING PROVIDER: Bonner Hair, MD  REFERRING DIAG: Rt shoulder posterior labrum tear  Rationale for Evaluation and Treatment: Rehabilitation  THERAPY DIAG:  Chronic right shoulder pain  Abnormal posture  ONSET DATE: chronic back pain with subacute on chronic shoulder pain (March 2024)   SUBJECTIVE:  SUBJECTIVE STATEMENT:  Stuck at a constant 2. I did a 20 mile ride on Friday and was tired but fine. Madison [name] did some light manip on Monday.     Cervical epidural on 03/31/24 Rt C6-C7. Repeat injection on 7/18.    EVAL: Have been seeing Madison [name] for about 6 years due to scoliosis. Neck pain without N/T. Rt-handed.   PERTINENT HISTORY:  Scoliosis with fusions that began at 38 yo   PAIN:  Are you having pain? Yes: NPRS scale: 6 Pain location: spine Pain description: hurts Aggravating factors: constant right now, arms reaching forward on bike created spasm/cramping Relieving factors: ice  PRECAUTIONS:  None  RED FLAGS: None   WEIGHT BEARING RESTRICTIONS:  No  FALLS:  Has patient fallen in last 6 months? No  OCCUPATION:  [employer] for [employer]  PLOF:  Independent  PATIENT GOALS:  Avoid surgery, decr pain   OBJECTIVE:  Note: Objective measures were completed at Evaluation unless otherwise noted.  DIAGNOSTIC FINDINGS:  MRI 01/23/24: IMPRESSION: 1. Findings suggestive of a nondisplaced tear at the base of the posteroinferior labrum. 2. No rotator cuff tear identified. MRI 03/24/24: IMPRESSION: 1. Cervical spondylosis as outlined within  the body of the report. 2. At C5-C6, there is moderate disc degeneration. A disc protrusion contributes to mild-to-moderate spinal canal stenosis to the right (with mild spinal cord flattening). Mild relative right neural foraminal narrowing also present at this level. 3. At C6-C7, there is moderate disc degeneration. A disc protrusion results in mild-to-moderate spinal canal stenosis (greater to the right) with mild spinal cord flattening. 4. No significant spinal canal or foraminal stenosis at the remaining cervical levels. 5. Multilevel facet arthropathy, greatest on the right at C4-C5 (moderate) and on the right at C5-C6 (mild-to-moderate). 6. Nonspecific reversal of the expected cervical lordosis. 7. Levocurvature of the cervical spine. 8. Grade 1 anterolisthesis at C3-C4 and C4-C5.  PATIENT SURVEYS:  Madison [name] 22.73 8/7: Quick Dash 18.18 9/25: Quick Dash     POSTURE:  Thoracic dextro/lumbar levoscoliosis with Rt shoulder elevation and scapular winging  HAND DOMINANCE:  Right    Body Part #1 Shoulder   UPPER EXTREMITY MMT: EVAL: Able to demo strength hold against resistance with poor scapular control.   MMT lb Right 9/25  Left 9/25 Rt/Lt 11/24  Shoulder flexion 8.5 9.1 13/18  Shoulder extension     Shoulder abduction 10.9 12.2 14.3/14.5  Shoulder adduction     Shoulder internal rotation 10.9 11.7 11.3/12.2  Shoulder external rotation 12.2 8.7 10.3/10.6  Elbow flexion     Elbow extension     (Blank rows = not tested)   9/2 cervical sidebend Rt 28 p! / Lt 34, rotation Rt 42 p!/ Lt 48 9/25: cervical sidebend Lt = pull on right upper trap; sidebend Rt = pull on left but pinch on Rt 11/6: pinching at upper throacic, Rt side with Rt cervical sidebend (resolved following manual therapy) 11/25: cervical sidebend 30 deg bilat. Rt rotation 60, Lt rotation 70  TREATMENT DATE:  10/09/24 Supine first rib mobs with upper trap STM Prone arm hang off of table + Lt cervical rotation, STM to levator & mobs of ribs Discussed balancing strength with cycling- also discussed measuring weight gain with scale and measurements as increasing muscle can increase scale pounds Discussed POC & awareness of body cues for need of further treatment  09/23/24 STM bilateral upper trap & levator, manual cervical distraction with sidebend stretching STM along scapular border- Rt side Supine rib breathing for mobility over rolled hand towel Discussed POC, posture and long term plan  09/16/24 Discussed bike fitting & results Measurements & discussion of functional contribution Prone STM Rt periscap, levator scap, scapular glides Prone scap retraction  11/6 Discussed bike shoes, force through foot, upcoming bike fitting Supine cervical traction Supine Lt to Rt cervical mobs (grade 2) paired with inferior Rt first rib mobs (grade 2) Suboccipital release STM Rt thoracic paraspinals & levator scap LT sidelying scapular mobs & contract/relax Seated Rt cervical sidbend with mob to C7 rib     PATIENT EDUCATION:  Education details: Teacher, Music of condition, POC, HEP, exercise form/rationale Person educated: Patient Education method: Explanation, Demonstration, Tactile cues, Verbal cues, and Handouts Education comprehension: verbalized understanding, returned demonstration, verbal cues required, tactile cues required, and needs further education  HOME EXERCISE PROGRAM: Access Code: [email or web address] URL: [email or web address] Date: 02/15/2024 Prepared by: Harlene Cordon  Rt hand holding mid cervical vertebrae, head turning right Prone cervical retraction, adding small head turns  Foot/ankle: [id number]  ASSESSMENT:  CLINICAL IMPRESSION: Able to decrease shoulder elevation and forward posture today. Pt will f/u for d/c visit on 12/30 and then d/c to  independent program.   REHAB POTENTIAL: Good  CLINICAL DECISION MAKING: Stable/uncomplicated  EVALUATION COMPLEXITY: Low   GOALS: Goals reviewed with patient? Yes  SHORT TERM GOALS: Target date: 5/24  Able to demo scapular control with UE MMT Baseline: Goal status: met  2.  Verbalize ability to correct posture throughout the day, particularly at work with her right arm reaching forward to a mouse/keyboard Baseline:  Goal status: met  3.  Begin incorporating UE strength back into regular gym workout Baseline:  Goal status: partially met- low level for continued neuro re-ed    LONG TERM GOALS: Target date: POC date  Able to strengthen upper body during regular workouts with minimal to no discomfort in shoulder Baseline:  Goal status: INITIAL  2.  Quick Hollis to improve by MDC Baseline:  Goal status: INITIAL  3.  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  Able to maintain minimal to no pain throughout regular daily activities Baseline:  Goal status: INITIAL    PLAN:  PT FREQUENCY: 1-2x/week  PT DURATION: POC date  PLANNED INTERVENTIONS: 97164- PT Re-evaluation, 97750- Physical Performance Testing, 97110-Therapeutic exercises, 97530- Therapeutic activity, [id number]- Neuromuscular re-education, 97535- Self Care, [id number]- Manual therapy, [phone]- Aquatic Therapy, Patient/Family education, Taping, Dry Needling, Joint mobilization, Spinal mobilization, Scar mobilization, Cryotherapy, and Moist heat.  PLAN FOR NEXT SESSION: bosu balance   Casmere Hollenbeck C. Gearldean Lomanto PT, DPT 10/09/2024 4:57 PM

## 2024-10-10 DIAGNOSIS — F411 Generalized anxiety disorder: Secondary | ICD-10-CM | POA: Diagnosis not present

## 2024-10-13 ENCOUNTER — Encounter: Payer: Self-pay | Admitting: Family Medicine

## 2024-10-14 ENCOUNTER — Encounter (HOSPITAL_BASED_OUTPATIENT_CLINIC_OR_DEPARTMENT_OTHER): Admitting: Physical Therapy

## 2024-10-21 ENCOUNTER — Ambulatory Visit (HOSPITAL_BASED_OUTPATIENT_CLINIC_OR_DEPARTMENT_OTHER): Admitting: Physical Therapy

## 2024-10-21 ENCOUNTER — Encounter (HOSPITAL_BASED_OUTPATIENT_CLINIC_OR_DEPARTMENT_OTHER): Payer: Self-pay | Admitting: Physical Therapy

## 2024-10-21 DIAGNOSIS — G8929 Other chronic pain: Secondary | ICD-10-CM

## 2024-10-21 DIAGNOSIS — M25511 Pain in right shoulder: Secondary | ICD-10-CM | POA: Diagnosis not present

## 2024-10-21 DIAGNOSIS — R293 Abnormal posture: Secondary | ICD-10-CM

## 2024-10-21 NOTE — Therapy (Signed)
 " OUTPATIENT PHYSICAL THERAPY TREATMENT   Patient Name: Madison [name] MRN: [id number] DOB:05/03/1986, 38 y.o., female Today's Date: 10/21/2024  END OF SESSION:  PT End of Session - 10/21/24 1107     Visit Number 32    Date for Recertification  10/22/24    Authorization Type [facility] [employer]    PT Start Time 1106    PT Stop Time 1130    PT Time Calculation (min) 24 min    Activity Tolerance Patient tolerated treatment well    Behavior During Therapy [facility] for tasks assessed/performed                        Past Medical History:  Diagnosis Date   Allergy    Asthma    Hx of migraines 01/012003   can tell when they come on, treats with excedrin migraine.   Scoliosis 10/24/1995   Vitamin D  deficiency    Past Surgical History:  Procedure Laterality Date   partial spinal fusion C7-T2 due to scoliosis 03/1998 N/A 04/11/1998   TONSILLECTOMY     WISDOM TOOTH EXTRACTION     Patient Active Problem List   Diagnosis Date Noted   DDD (degenerative disc disease), cervical 05/05/2024   Fatigue 10/05/2023   Right shoulder pain 04/24/2023   Hyperlipidemia 02/25/2023   Arthralgia of right acromioclavicular joint 11/28/2022   Puncture wound of skin from metal nail 07/27/2022   B12 deficiency 02/08/2022   Vitamin D  deficiency 02/08/2022   Allergy, unspecified, initial encounter 01/25/2022   Laceration without foreign body of other finger without damage to nail, initial encounter 01/25/2022   Carpal tunnel syndrome on right 11/07/2021   Dorsalgia, unspecified 08/21/2021   Migraine, unspecified, not intractable, without status migrainosus 08/21/2021   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   Chronic urticaria 05/11/2021   Other adverse food reactions, not elsewhere classified, subsequent encounter 05/11/2021   Seasonal and perennial allergic rhinitis 05/11/2021   Mild intermittent asthma without complication 02/02/2021   Trigger point of right shoulder region  04/07/2020   Nonallopathic lesion of lumbosacral region 11/07/2018   Nonallopathic lesion of sacral region 11/07/2018   Nonallopathic lesion of thoracic region 11/07/2018   Volar plate injury of finger 01/22/2018   Scoliosis 08/21/2017    REFERRING PROVIDER: Bonner Hair, MD  REFERRING DIAG: Rt shoulder posterior labrum tear  Rationale for Evaluation and Treatment: Rehabilitation  THERAPY DIAG:  Chronic right shoulder pain  Abnormal posture  ONSET DATE: chronic back pain with subacute on chronic shoulder pain (March 2024)   SUBJECTIVE:  SUBJECTIVE STATEMENT:  My neck was really hurting and I was able to take some ibuprofen , ice, scap squeezes and get it to pop to make the N/T go away.    Cervical epidural on 03/31/24 Rt C6-C7. Repeat injection on 7/18.    EVAL: Have been seeing Dr claudene for about 6 years due to scoliosis. Neck pain without N/T. Rt-handed.   PERTINENT HISTORY:  Scoliosis with fusions that began at 39 yo   PAIN:  Are you having pain? Yes: NPRS scale: 6 Pain location: spine Pain description: hurts Aggravating factors: constant right now, arms reaching forward on bike created spasm/cramping Relieving factors: ice  PRECAUTIONS:  None  RED FLAGS: None   WEIGHT BEARING RESTRICTIONS:  No  FALLS:  Has patient fallen in last 6 months? No  OCCUPATION:  [employer]  PLOF:  Independent  PATIENT GOALS:  Avoid surgery, decr pain   OBJECTIVE:  Note: Objective measures were completed at Evaluation unless otherwise noted.  DIAGNOSTIC FINDINGS:  MRI 01/23/24: IMPRESSION: 1. Findings suggestive of a nondisplaced tear at the base of the posteroinferior labrum. 2. No rotator cuff tear identified. MRI 03/24/24: IMPRESSION: 1. Cervical spondylosis as  outlined within the body of the report. 2. At C5-C6, there is moderate disc degeneration. A disc protrusion contributes to mild-to-moderate spinal canal stenosis to the right (with mild spinal cord flattening). Mild relative right neural foraminal narrowing also present at this level. 3. At C6-C7, there is moderate disc degeneration. A disc protrusion results in mild-to-moderate spinal canal stenosis (greater to the right) with mild spinal cord flattening. 4. No significant spinal canal or foraminal stenosis at the remaining cervical levels. 5. Multilevel facet arthropathy, greatest on the right at C4-C5 (moderate) and on the right at C5-C6 (mild-to-moderate). 6. Nonspecific reversal of the expected cervical lordosis. 7. Levocurvature of the cervical spine. 8. Grade 1 anterolisthesis at C3-C4 and C4-C5.  PATIENT SURVEYS:  [name] 22.73 8/7: Quick Dash 18.18 12/30: Quick Dash  25.36    POSTURE:  Thoracic dextro/lumbar levoscoliosis with Rt shoulder elevation and scapular winging  HAND DOMINANCE:  Right    Body Part #1 Shoulder   UPPER EXTREMITY MMT: EVAL: Able to demo strength hold against resistance with poor scapular control.   MMT lb Right 9/25  Left 9/25 Rt/Lt 11/24 Rt/Lt 12/30  Shoulder flexion 8.5 9.1 13/18 17/17.3  Shoulder extension      Shoulder abduction 10.9 12.2 14.3/14.5 18.7/21.4  Shoulder adduction      Shoulder internal rotation 10.9 11.7 11.3/12.2 12.2/13.1  Shoulder external rotation 12.2 8.7 10.3/10.6 12.8/12.0  Elbow flexion      Elbow extension      (Blank rows = not tested)   9/2 cervical sidebend Rt 28 p! / Lt 34, rotation Rt 42 p!/ Lt 48 9/25: cervical sidebend Lt = pull on right upper trap; sidebend Rt = pull on left but pinch on Rt 11/6: pinching at upper throacic, Rt side with Rt cervical sidebend (resolved following manual therapy) 11/25: cervical sidebend 30 deg bilat. Rt rotation 60, Lt rotation 70  TREATMENT DATE:  12/30 Discussed goals, testing, further workouts with pilates STM Rt upper trap & cervical region  10/09/24 Supine first rib mobs with upper trap STM Prone arm hang off of table + Lt cervical rotation, STM to levator & mobs of ribs Discussed balancing strength with cycling- also discussed measuring weight gain with scale and measurements as increasing muscle can increase scale pounds Discussed POC & awareness of body cues for need of further treatment  09/23/24 STM bilateral upper trap & levator, manual cervical distraction with sidebend stretching STM along scapular border- Rt side Supine rib breathing for mobility over rolled hand towel Discussed POC, posture and long term plan  09/16/24 Discussed bike fitting & results Measurements & discussion of functional contribution Prone STM Rt periscap, levator scap, scapular glides Prone scap retraction  11/6 Discussed bike shoes, force through foot, upcoming bike fitting Supine cervical traction Supine Lt to Rt cervical mobs (grade 2) paired with inferior Rt first rib mobs (grade 2) Suboccipital release STM Rt thoracic paraspinals & levator scap LT sidelying scapular mobs & contract/relax Seated Rt cervical sidbend with mob to C7 rib     PATIENT EDUCATION:  Education details: Teacher, Music of condition, POC, HEP, exercise form/rationale Person educated: Patient Education method: Explanation, Demonstration, Tactile cues, Verbal cues, and Handouts Education comprehension: verbalized understanding, returned demonstration, verbal cues required, tactile cues required, and needs further education  HOME EXERCISE PROGRAM: Access Code: [email or web address] URL: [email or web address] Date: 02/15/2024 Prepared by: Harlene Cordon  Rt hand holding mid cervical vertebrae, head turning right Prone cervical retraction, adding small head  turns  Foot/ankle: [id number]  ASSESSMENT:  CLINICAL IMPRESSION: Pt has met her goals at this time and is prepared for d/c to independent program. Was encouraged to reach out with any further questions.   REHAB POTENTIAL: Good  CLINICAL DECISION MAKING: Stable/uncomplicated  EVALUATION COMPLEXITY: Low   GOALS: Goals reviewed with patient? Yes  SHORT TERM GOALS: Target date: 5/24  Able to demo scapular control with UE MMT Baseline: Goal status: met  2.  Verbalize ability to correct posture throughout the day, particularly at work with her right arm reaching forward to a mouse/keyboard Baseline:  Goal status: met  3.  Begin incorporating UE strength back into regular gym workout Baseline:  Goal status: partially met- low level for continued neuro re-ed    LONG TERM GOALS: Target date: POC date  Able to strengthen upper body during regular workouts with minimal to no discomfort in shoulder Baseline:  Goal status: able to use upper body for bike stability without pain, doing PT exercises  2.  Quick Hollis to improve by MDC Baseline:  Goal status: MET  3.  Pt will return to trying wheel pottery with postural awareness Baseline: ok with not doing this Goal status: Deferred per pt.   4.  Able to maintain minimal to no pain throughout regular daily activities Baseline:  up to 26 miles on bike Goal status: Met    PLAN:  PT FREQUENCY: 1-2x/week  PT DURATION: POC date  PLANNED INTERVENTIONS: 97164- PT Re-evaluation, 97750- Physical Performance Testing, 97110-Therapeutic exercises, 97530- Therapeutic activity, [id number]- Neuromuscular re-education, 97535- Self Care, 02859- Manual therapy, [phone]- Aquatic Therapy, Patient/Family education, Taping, Dry Needling, Joint mobilization, Spinal mobilization, Scar mobilization, Cryotherapy, and Moist heat.     Sukhdeep Wieting C. Arshan Jabs PT, DPT 10/21/2024 11:37 AM    "

## 2024-10-28 ENCOUNTER — Encounter: Payer: Self-pay | Admitting: Emergency Medicine

## 2024-10-28 ENCOUNTER — Ambulatory Visit (INDEPENDENT_AMBULATORY_CARE_PROVIDER_SITE_OTHER): Admitting: Emergency Medicine

## 2024-10-28 VITALS — BP 119/75 | HR 88 | Ht 63.0 in | Wt 143.0 lb

## 2024-10-28 DIAGNOSIS — F32A Depression, unspecified: Secondary | ICD-10-CM

## 2024-10-28 DIAGNOSIS — F419 Anxiety disorder, unspecified: Secondary | ICD-10-CM | POA: Diagnosis not present
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
## 2024-11-02 ENCOUNTER — Encounter: Payer: Self-pay | Admitting: Family Medicine

## 2024-11-04 ENCOUNTER — Other Ambulatory Visit: Payer: Self-pay

## 2024-11-04 DIAGNOSIS — M5412 Radiculopathy, cervical region: Secondary | ICD-10-CM

## 2024-11-05 ENCOUNTER — Inpatient Hospital Stay
Admission: RE | Admit: 2024-11-05 | Discharge: 2024-11-05 | Disposition: A | Source: Ambulatory Visit | Attending: Family Medicine | Admitting: Family Medicine

## 2024-11-05 DIAGNOSIS — M5412 Radiculopathy, cervical region: Secondary | ICD-10-CM

## 2024-11-05 MED ORDER — IOPAMIDOL (ISOVUE-M 300) INJECTION 61%
1.0000 mL | Freq: Once | INTRAMUSCULAR | Status: AC | PRN
  Administered 2024-11-05: 1 mL via EPIDURAL

## 2024-11-05 MED ORDER — TRIAMCINOLONE ACETONIDE 40 MG/ML IJ SUSP (RADIOLOGY)
60.0000 mg | Freq: Once | INTRAMUSCULAR | Status: AC
  Administered 2024-11-05: 60 mg via EPIDURAL

## 2024-11-11 ENCOUNTER — Ambulatory Visit: Admitting: Emergency Medicine

## 2024-12-04 ENCOUNTER — Ambulatory Visit: Admitting: Family Medicine

## 2025-06-09 ENCOUNTER — Ambulatory Visit: Admitting: Dermatology
